# Patient Record
Sex: Female | Born: 1945 | Race: Black or African American | Hispanic: No | Marital: Married | Smoking: Current every day smoker
Health system: Southern US, Community
[De-identification: ages and names within clinical notes are randomized; demographics above are authoritative.]

## PROBLEM LIST (undated history)

## (undated) DIAGNOSIS — K219 Gastro-esophageal reflux disease without esophagitis: Secondary | ICD-10-CM

## (undated) DIAGNOSIS — J309 Allergic rhinitis, unspecified: Secondary | ICD-10-CM

## (undated) DIAGNOSIS — F411 Generalized anxiety disorder: Secondary | ICD-10-CM

## (undated) DIAGNOSIS — E785 Hyperlipidemia, unspecified: Secondary | ICD-10-CM

## (undated) DIAGNOSIS — Z8619 Personal history of other infectious and parasitic diseases: Secondary | ICD-10-CM

## (undated) DIAGNOSIS — T7840XA Allergy, unspecified, initial encounter: Secondary | ICD-10-CM

## (undated) DIAGNOSIS — E119 Type 2 diabetes mellitus without complications: Secondary | ICD-10-CM

## (undated) HISTORY — PX: MOUTH SURGERY: SHX715

## (undated) HISTORY — DX: Type 2 diabetes mellitus without complications: E11.9

## (undated) HISTORY — DX: Personal history of other infectious and parasitic diseases: Z86.19

## (undated) HISTORY — DX: Generalized anxiety disorder: F41.1

## (undated) HISTORY — DX: Hyperlipidemia, unspecified: E78.5

## (undated) HISTORY — DX: Gastro-esophageal reflux disease without esophagitis: K21.9

## (undated) HISTORY — DX: Allergic rhinitis, unspecified: J30.9

## (undated) HISTORY — PX: BREAST BIOPSY: SHX20

## (undated) HISTORY — DX: Allergy, unspecified, initial encounter: T78.40XA

---

## 1999-06-18 ENCOUNTER — Other Ambulatory Visit: Admission: RE | Admit: 1999-06-18 | Discharge: 1999-06-18 | Payer: Self-pay | Admitting: Obstetrics & Gynecology

## 2000-11-29 ENCOUNTER — Encounter: Payer: Self-pay | Admitting: Obstetrics and Gynecology

## 2000-11-29 ENCOUNTER — Ambulatory Visit (HOSPITAL_COMMUNITY): Admission: RE | Admit: 2000-11-29 | Discharge: 2000-11-29 | Payer: Self-pay | Admitting: Obstetrics and Gynecology

## 2002-05-09 ENCOUNTER — Encounter: Payer: Self-pay | Admitting: Internal Medicine

## 2002-05-09 ENCOUNTER — Ambulatory Visit (HOSPITAL_COMMUNITY): Admission: RE | Admit: 2002-05-09 | Discharge: 2002-05-09 | Payer: Self-pay | Admitting: Internal Medicine

## 2002-05-17 ENCOUNTER — Encounter: Payer: Self-pay | Admitting: Internal Medicine

## 2002-05-17 ENCOUNTER — Encounter: Admission: RE | Admit: 2002-05-17 | Discharge: 2002-05-17 | Payer: Self-pay | Admitting: Internal Medicine

## 2003-09-11 ENCOUNTER — Ambulatory Visit (HOSPITAL_COMMUNITY): Admission: RE | Admit: 2003-09-11 | Discharge: 2003-09-11 | Payer: Self-pay | Admitting: Internal Medicine

## 2004-09-15 ENCOUNTER — Ambulatory Visit (HOSPITAL_COMMUNITY): Admission: RE | Admit: 2004-09-15 | Discharge: 2004-09-15 | Payer: Self-pay | Admitting: Internal Medicine

## 2005-07-16 ENCOUNTER — Ambulatory Visit: Payer: Self-pay | Admitting: Internal Medicine

## 2005-07-20 ENCOUNTER — Ambulatory Visit: Payer: Self-pay | Admitting: Internal Medicine

## 2005-11-23 ENCOUNTER — Ambulatory Visit (HOSPITAL_COMMUNITY): Admission: RE | Admit: 2005-11-23 | Discharge: 2005-11-23 | Payer: Self-pay | Admitting: Internal Medicine

## 2005-12-21 ENCOUNTER — Ambulatory Visit: Payer: Self-pay | Admitting: Internal Medicine

## 2006-07-04 ENCOUNTER — Ambulatory Visit: Payer: Self-pay | Admitting: Internal Medicine

## 2006-09-09 ENCOUNTER — Ambulatory Visit: Payer: Self-pay | Admitting: Internal Medicine

## 2006-09-09 LAB — CONVERTED CEMR LAB
ALT: 17 units/L (ref 0–40)
AST: 19 units/L (ref 0–37)
Basophils Relative: 0.2 % (ref 0.0–1.0)
Bilirubin, Direct: 0.1 mg/dL (ref 0.0–0.3)
CO2: 31 meq/L (ref 19–32)
Calcium: 9.6 mg/dL (ref 8.4–10.5)
Eosinophils Absolute: 0.1 10*3/uL (ref 0.0–0.6)
Eosinophils Relative: 1.1 % (ref 0.0–5.0)
GFR calc Af Amer: 65 mL/min
GFR calc non Af Amer: 53 mL/min
Glucose, Bld: 131 mg/dL — ABNORMAL HIGH (ref 70–99)
HCT: 40.1 % (ref 36.0–46.0)
Hemoglobin: 14 g/dL (ref 12.0–15.0)
Hgb A1c MFr Bld: 7 % — ABNORMAL HIGH (ref 4.6–6.0)
Leukocytes, UA: NEGATIVE
Lymphocytes Relative: 37.9 % (ref 12.0–46.0)
MCV: 83 fL (ref 78.0–100.0)
Neutro Abs: 4.9 10*3/uL (ref 1.4–7.7)
Neutrophils Relative %: 54 % (ref 43.0–77.0)
Nitrite: NEGATIVE
Sodium: 142 meq/L (ref 135–145)
Specific Gravity, Urine: 1.025 (ref 1.000–1.03)
TSH: 1.44 microintl units/mL (ref 0.35–5.50)
Total Protein: 7 g/dL (ref 6.0–8.3)
Triglycerides: 258 mg/dL (ref 0–149)
Urobilinogen, UA: 0.2 (ref 0.0–1.0)
WBC: 9 10*3/uL (ref 4.5–10.5)

## 2006-09-15 ENCOUNTER — Ambulatory Visit: Payer: Self-pay | Admitting: Internal Medicine

## 2006-11-17 ENCOUNTER — Ambulatory Visit: Payer: Self-pay | Admitting: Internal Medicine

## 2006-11-24 ENCOUNTER — Encounter: Payer: Self-pay | Admitting: Internal Medicine

## 2006-11-24 DIAGNOSIS — E119 Type 2 diabetes mellitus without complications: Secondary | ICD-10-CM

## 2006-11-24 DIAGNOSIS — E785 Hyperlipidemia, unspecified: Secondary | ICD-10-CM | POA: Insufficient documentation

## 2006-11-24 DIAGNOSIS — J309 Allergic rhinitis, unspecified: Secondary | ICD-10-CM | POA: Insufficient documentation

## 2006-11-24 HISTORY — DX: Hyperlipidemia, unspecified: E78.5

## 2006-11-24 HISTORY — DX: Allergic rhinitis, unspecified: J30.9

## 2006-11-24 HISTORY — DX: Type 2 diabetes mellitus without complications: E11.9

## 2006-11-25 ENCOUNTER — Ambulatory Visit (HOSPITAL_COMMUNITY): Admission: RE | Admit: 2006-11-25 | Discharge: 2006-11-25 | Payer: Self-pay | Admitting: Internal Medicine

## 2007-03-07 ENCOUNTER — Ambulatory Visit: Payer: Self-pay | Admitting: Internal Medicine

## 2007-03-07 DIAGNOSIS — M25569 Pain in unspecified knee: Secondary | ICD-10-CM | POA: Insufficient documentation

## 2007-03-07 DIAGNOSIS — J019 Acute sinusitis, unspecified: Secondary | ICD-10-CM | POA: Insufficient documentation

## 2007-03-08 LAB — CONVERTED CEMR LAB
Calcium: 9.8 mg/dL (ref 8.4–10.5)
Chloride: 99 meq/L (ref 96–112)
Cholesterol: 199 mg/dL (ref 0–200)
Creatinine, Ser: 1 mg/dL (ref 0.4–1.2)
Direct LDL: 99.9 mg/dL
GFR calc non Af Amer: 60 mL/min
Hgb A1c MFr Bld: 6.8 % — ABNORMAL HIGH (ref 4.6–6.0)
Total CHOL/HDL Ratio: 4.7
VLDL: 55 mg/dL — ABNORMAL HIGH (ref 0–40)

## 2007-05-24 ENCOUNTER — Ambulatory Visit: Payer: Self-pay | Admitting: Internal Medicine

## 2007-05-24 DIAGNOSIS — R030 Elevated blood-pressure reading, without diagnosis of hypertension: Secondary | ICD-10-CM

## 2007-11-27 ENCOUNTER — Ambulatory Visit (HOSPITAL_COMMUNITY): Admission: RE | Admit: 2007-11-27 | Discharge: 2007-11-27 | Payer: Self-pay | Admitting: Internal Medicine

## 2007-12-12 ENCOUNTER — Ambulatory Visit: Payer: Self-pay | Admitting: Internal Medicine

## 2007-12-12 DIAGNOSIS — J209 Acute bronchitis, unspecified: Secondary | ICD-10-CM | POA: Insufficient documentation

## 2008-01-17 ENCOUNTER — Ambulatory Visit: Payer: Self-pay | Admitting: Internal Medicine

## 2008-03-04 ENCOUNTER — Ambulatory Visit: Payer: Self-pay | Admitting: Internal Medicine

## 2008-07-05 ENCOUNTER — Ambulatory Visit: Payer: Self-pay | Admitting: Internal Medicine

## 2008-07-05 LAB — CONVERTED CEMR LAB
Basophils Absolute: 0 10*3/uL (ref 0.0–0.1)
Basophils Relative: 0.2 % (ref 0.0–3.0)
Bilirubin, Direct: 0.1 mg/dL (ref 0.0–0.3)
Chloride: 106 meq/L (ref 96–112)
Cholesterol: 121 mg/dL (ref 0–200)
Eosinophils Absolute: 0.1 10*3/uL (ref 0.0–0.7)
GFR calc non Af Amer: 81.03 mL/min (ref >60)
HDL: 39.5 mg/dL (ref >39.00)
Hemoglobin, Urine: NEGATIVE
Hgb A1c MFr Bld: 7.1 % — ABNORMAL HIGH (ref 4.6–6.5)
LDL Cholesterol: 43 mg/dL (ref 0–99)
MCHC: 33.7 g/dL (ref 30.0–36.0)
MCV: 85.7 fL (ref 78.0–100.0)
Microalb Creat Ratio: 70.4 mg/g — ABNORMAL HIGH (ref 0.0–30.0)
Monocytes Absolute: 0.5 10*3/uL (ref 0.1–1.0)
Neutrophils Relative %: 49.8 % (ref 43.0–77.0)
Nitrite: NEGATIVE
Platelets: 237 10*3/uL (ref 150.0–400.0)
Potassium: 4.7 meq/L (ref 3.5–5.1)
RBC: 4.77 M/uL (ref 3.87–5.11)
RDW: 12.9 % (ref 11.5–14.6)
Sodium: 142 meq/L (ref 135–145)
Specific Gravity, Urine: 1.025 (ref 1.000–1.030)
Total Protein, Urine: NEGATIVE mg/dL
Total Protein: 6.8 g/dL (ref 6.0–8.3)
Triglycerides: 193 mg/dL — ABNORMAL HIGH (ref 0.0–149.0)
Urobilinogen, UA: 0.2 (ref 0.0–1.0)
VLDL: 38.6 mg/dL (ref 0.0–40.0)

## 2008-07-10 ENCOUNTER — Ambulatory Visit: Payer: Self-pay | Admitting: Internal Medicine

## 2008-07-10 DIAGNOSIS — F411 Generalized anxiety disorder: Secondary | ICD-10-CM | POA: Insufficient documentation

## 2008-07-10 HISTORY — DX: Generalized anxiety disorder: F41.1

## 2008-09-23 ENCOUNTER — Ambulatory Visit: Payer: Self-pay | Admitting: Internal Medicine

## 2008-11-28 ENCOUNTER — Ambulatory Visit (HOSPITAL_COMMUNITY): Admission: RE | Admit: 2008-11-28 | Discharge: 2008-11-28 | Payer: Self-pay | Admitting: Internal Medicine

## 2009-12-03 ENCOUNTER — Ambulatory Visit (HOSPITAL_COMMUNITY): Admission: RE | Admit: 2009-12-03 | Discharge: 2009-12-03 | Payer: Self-pay | Admitting: Internal Medicine

## 2010-03-11 ENCOUNTER — Ambulatory Visit: Payer: Self-pay | Admitting: Internal Medicine

## 2010-03-13 LAB — CONVERTED CEMR LAB
ALT: 14 units/L (ref 0–35)
BUN: 14 mg/dL (ref 6–23)
Basophils Absolute: 0 10*3/uL (ref 0.0–0.1)
Blood, UA: NEGATIVE
Chloride: 104 meq/L (ref 96–112)
Cholesterol: 132 mg/dL (ref 0–200)
Eosinophils Absolute: 0.1 10*3/uL (ref 0.0–0.7)
Glucose, Bld: 127 mg/dL — ABNORMAL HIGH (ref 70–99)
HCT: 40.8 % (ref 36.0–46.0)
HDL: 43.6 mg/dL (ref >39.00)
Lymphs Abs: 3.2 10*3/uL (ref 0.7–4.0)
MCHC: 33.5 g/dL (ref 30.0–36.0)
MCV: 85 fL (ref 78.0–100.0)
Monocytes Absolute: 0.5 10*3/uL (ref 0.1–1.0)
Platelets: 250 10*3/uL (ref 150.0–400.0)
Potassium: 4.5 meq/L (ref 3.5–5.1)
RDW: 13.8 % (ref 11.5–14.6)
TSH: 1.09 microintl units/mL (ref 0.35–5.50)
Total Bilirubin: 0.4 mg/dL (ref 0.3–1.2)
Total Protein, Urine: NEGATIVE mg/dL
Urine Glucose: NEGATIVE mg/dL
VLDL: 30.8 mg/dL (ref 0.0–40.0)

## 2010-03-18 ENCOUNTER — Encounter: Payer: Self-pay | Admitting: Internal Medicine

## 2010-03-18 ENCOUNTER — Ambulatory Visit: Payer: Self-pay | Admitting: Internal Medicine

## 2010-03-18 DIAGNOSIS — N959 Unspecified menopausal and perimenopausal disorder: Secondary | ICD-10-CM | POA: Insufficient documentation

## 2010-04-01 ENCOUNTER — Telehealth (INDEPENDENT_AMBULATORY_CARE_PROVIDER_SITE_OTHER): Payer: Self-pay | Admitting: *Deleted

## 2010-04-30 NOTE — Progress Notes (Signed)
  Phone Note Other Incoming   Request: Send information Summary of Call: Request for records received from [employer]. Request forwarded to [facility].

## 2010-04-30 NOTE — Assessment & Plan Note (Signed)
Summary: cpx-lb   Vital Signs:  Patient profile:   65 year old female Height:      64 inches Weight:      207.13 pounds BMI:     35.68 O2 Sat:      97 % on Room air Temp:     99 degrees F oral Pulse rate:   99 / minute BP sitting:   130 / 80  (left arm) Cuff size:   large  Vitals Entered By: Zella Ball Ewing CMA Duncan Dull) (March 18, 2010 1:21 PM)  O2 Flow:  Room air  Preventive Care Screening  Last Flu Shot:    Date:  01/27/2010    Results:  given   CC: Adult Physical/RE   CC:  Adult Physical/RE.  History of Present Illness: overall doing well;  Pt denies CP, worsening sob, doe, wheezing, orthopnea, pnd, worsening LE edema, palps, dizziness or syncope   Pt denies new neuro symptoms such as headache, facial or extremity weakness  Pt denies polydipsia, polyuria, or low sugar symptoms such as shakiness improved with eating.  Overall good compliance with meds, trying to follow low chol, DM diet, wt stable, little excercise however  No fever, wt loss, night sweats, loss of appetite or other constitutional symptoms  Denies worsening depressive symptoms, suicidal ideation, or panic.   Overall good compliance with meds, and good tolerability except out of metformin for 3 wks prior to the a1c just done.  Pt states good ability with ADL's, low fall risk, home safety reviewed and adequate, no significant change in hearing or vision, trying to follow lower chol diet, and occasionally active only with regular excercise.   Preventive Screening-Counseling & Management      Drug Use:  no.    Problems Prior to Update: 1)  Menopausal Disorder  (ICD-627.9) 2)  Anxiety  (ICD-300.00) 3)  Preventive Health Care  (ICD-V70.0) 4)  Sinusitis- Acute-nos  (ICD-461.9) 5)  Asthmatic Bronchitis, Acute  (ICD-466.0) 6)  Elevated Blood Pressure Without Diagnosis of Hypertension  (ICD-796.2) 7)  Knee Pain, Left  (ICD-719.46) 8)  Sinusitis- Acute-nos  (ICD-461.9) 9)  Allergic Rhinitis  (ICD-477.9) 10)   Hyperlipidemia  (ICD-272.4) 11)  Diabetes Mellitus, Type II  (ICD-250.00)  Medications Prior to Update: 1)  Simvastatin 40 Mg Tabs (Simvastatin) .... Take 1 Tablet By Mouth Once A Day 2)  Metformin Hcl 500 Mg  Tb24 (Metformin Hcl) .... 2 By Mouth Once Daily 3)  Adult Aspirin Ec Low Strength 81 Mg Tbec (Aspirin) .Marland Kitchen.. 1 By Mouth Once Daily 4)  Meloxicam 15 Mg Tabs (Meloxicam) .Marland Kitchen.. 1 By Mouth Once Daily 5)  Avelox 400 Mg Tabs (Moxifloxacin Hcl) .Marland Kitchen.. 1po Once Daily For 6 Days in Samples 6)  Proair Hfa 108 (90 Base) Mcg/act Aers (Albuterol Sulfate) .... 2 Puffs Four Times Per Day As Needed For Shortness of Breath 7)  Prednisone 10 Mg Tabs (Prednisone) .... 3po Qd For 3days, Then 2po Qd For 3days, Then 1po Qd For 3days, Then Stop 8)  Promethazine-Codeine 6.25-10 Mg/42ml Syrp (Promethazine-Codeine) .Marland Kitchen.. 1 Tsp By Mouth Q 6 Hrs As Needed Cough  Current Medications (verified): 1)  Simvastatin 40 Mg Tabs (Simvastatin) .... Take 1 Tablet By Mouth Once A Day 2)  Metformin Hcl 500 Mg  Tb24 (Metformin Hcl) .... 2 By Mouth Once Daily 3)  Adult Aspirin Ec Low Strength 81 Mg Tbec (Aspirin) .Marland Kitchen.. 1 By Mouth Once Daily 4)  Meloxicam 15 Mg Tabs (Meloxicam) .Marland Kitchen.. 1 By Mouth Once Daily As Needed  Allergies (  verified): 1)  ! 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 HTN stroke  Social History: Reviewed history from 03/07/2007 and no changes required. 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         all otherwise negative per pt -    Physical Exam  General:  alert and overweight-appearing. Head:  normocephalic and atraumatic.   Eyes:  vision grossly intact, pupils equal, and pupils round.   Ears:  R ear normal and L ear normal.   Nose:  no external deformity and no nasal discharge.   Mouth:  no gingival abnormalities and pharynx pink and moist.   Neck:  supple and no masses.   Lungs:  normal respiratory effort and normal breath sounds.   Heart:  normal rate and regular rhythm.   Abdomen:  soft, non-tender, and normal bowel sounds.   Msk:  no joint tenderness and no joint swelling.   Extremities:  no edema, no ulcers  Neurologic:  strength normal in all extremities and gait normal.   Skin:  color normal and no rashes.   Psych:  not depressed appearing and slightly anxious.     Impression & Recommendations:  Problem # 1:  Preventive Health Care (ICD-V70.0) Overall doing well, age appropriate education and counseling updated, referral for preventive services and immunizations addressed, dietary counseling and smoking status adressed , most recent labs reviewed, ecg reviewed I have personally reviewed and have noted 1.The patient's medical and social history 2.Their use of alcohol, tobacco or illicit drugs 3.Their current medications and supplements 4. Functional ability including ADL's, fall risk, home safety risk, hearing & visual impairment  5.Diet and  physical activities 6.Evidence for depression or mood disorders The patients weight, height, BMI  have been recorded in the chart I have made referrals, counseling and provided education to the patient based review of the above  Orders: EKG w/ Interpretation (93000)  Problem # 2:  DIABETES MELLITUS, TYPE II (ICD-250.00)  Her updated medication list for this problem includes:    Metformin Hcl 500 Mg Tb24 (Metformin hcl) .Marland Kitchen... 2 by mouth once daily    Adult Aspirin Ec Low Strength 81 Mg Tbec (Aspirin) .Marland Kitchen... 1 by mouth once daily  Labs Reviewed: Creat: 1.0 (03/13/2010)    Reviewed HgBA1c results: 7.1 (03/13/2010)  7.1 (07/05/2008) to re-start meds, Continue all previous medications as before this visit , Pt to cont DM diet, excercise, wt control efforts; to check labs next visit  Complete Medication List: 1)  Simvastatin 40 Mg Tabs (Simvastatin) .... Take 1 tablet by mouth once a day 2)  Metformin Hcl 500 Mg Tb24 (Metformin hcl) .... 2 by mouth once daily 3)  Adult Aspirin Ec Low Strength 81  Mg Tbec (Aspirin) .Marland Kitchen.. 1 by mouth once daily 4)  Meloxicam 15 Mg Tabs (Meloxicam) .Marland Kitchen.. 1 by mouth once daily as needed  Other Orders: T-Bone Densitometry [id number])  Patient Instructions: 1)  please schedule the bone density test before leaving today 2)  Continue all previous medications as before this visit  3)  Please schedule a follow-up appointment in 1 year, or sooner if needed Prescriptions: MELOXICAM 15 MG TABS (MELOXICAM) 1 by mouth once daily as needed  #90 x 3   Entered and Authorized by:   Corwin Levins MD   Signed by:   Corwin Levins MD on 03/18/2010   Method used:   Electronically to        [facility]  [address] [phone]* (retail)       [address]       [place], [place]  [id number]       Ph: [phone] or [phone]       Fax: [phone]   RxID:   [id number] METFORMIN HCL 500 MG  TB24 (METFORMIN HCL) 2 by mouth once daily  #180 x 3   Entered  and Authorized by:   Corwin Levins MD   Signed by:   Corwin Levins MD on 03/18/2010   Method used:   [facility] to        [facility]  [address] [phone][facility] (retail)       [address]       [place], [place]  [id number]       Ph: [phone] or [phone]       Fax: [phone]   RxID:   [id number] SIMVASTATIN 40 MG TABS (SIMVASTATIN) Take 1 tablet by mouth once a day  #90 x 3   Entered and Authorized by:   Corwin Levins MD   Signed by:   Corwin Levins MD on 03/18/2010   Method used:   [facility]        [facility]  [address] [phone][facility] (retail)       [address]       [place], [place]  [address]       Ph: [phone] or [phone]       Fax: [phone]   RxID:   [id number]    Orders Added: 1)  EKG w/ Interpretation [93000] 2)  T-Bone Densitometry [77080] 3)  Est. Patient 65& > [44315]

## 2010-05-05 ENCOUNTER — Telehealth: Payer: Self-pay | Admitting: Internal Medicine

## 2010-05-05 ENCOUNTER — Ambulatory Visit (INDEPENDENT_AMBULATORY_CARE_PROVIDER_SITE_OTHER)
Admission: RE | Admit: 2010-05-05 | Discharge: 2010-05-05 | Disposition: A | Discharge status: Home / Self Care | Payer: Federal, State, Local not specified - PPO | Source: Ambulatory Visit | Attending: Internal Medicine | Admitting: Internal Medicine

## 2010-05-05 ENCOUNTER — Encounter: Payer: Self-pay | Admitting: Internal Medicine

## 2010-05-05 ENCOUNTER — Other Ambulatory Visit: Payer: Self-pay | Admitting: Internal Medicine

## 2010-05-05 ENCOUNTER — Inpatient Hospital Stay: Admission: RE | Admit: 2010-05-05 | Payer: Self-pay | Source: Ambulatory Visit

## 2010-05-05 DIAGNOSIS — N959 Unspecified menopausal and perimenopausal disorder: Secondary | ICD-10-CM

## 2010-05-14 NOTE — Progress Notes (Signed)
Summary: DXA order  Phone Note From Other Clinic   Caller: Tera Helper [phone] Summary of Call: Pt is scheduled for DXA at 9a today. Xray states they cannot proceed without an order for Lumbar Vertebral Assessment. Pt is waiting in radiology. Initial call taken by: Margaret Pyle, CMA,  May 05, 2010 8:52 AM  Follow-up for Phone Call        done   please ask - is this needed for every DXA? Follow-up by: Corwin Levins MD,  May 05, 2010 9:03 AM  Additional Follow-up for Phone Call Additional follow up Details #1::        it is needed with every DXA order [facility]) Additional Follow-up by: Margaret Pyle, CMA,  May 05, 2010 9:08 AM    Additional Follow-up for Phone Call Additional follow up Details #2::    thanks good to know Follow-up by: Corwin Levins MD,  May 05, 2010 12:27 PM

## 2011-03-04 ENCOUNTER — Other Ambulatory Visit: Payer: Self-pay | Admitting: Internal Medicine

## 2011-04-15 ENCOUNTER — Other Ambulatory Visit: Payer: Self-pay

## 2011-04-15 MED ORDER — SIMVASTATIN 40 MG PO TABS: 40.0000 mg | ORAL_TABLET | Freq: Every day | ORAL | Status: DC

## 2011-04-28 ENCOUNTER — Telehealth: Payer: Self-pay

## 2011-04-28 DIAGNOSIS — Z0000 Encounter for general adult medical examination without abnormal findings: Secondary | ICD-10-CM

## 2011-04-28 NOTE — Telephone Encounter (Signed)
Put order in for labs. 

## 2011-04-29 ENCOUNTER — Ambulatory Visit (INDEPENDENT_AMBULATORY_CARE_PROVIDER_SITE_OTHER): Payer: BC Managed Care – PPO | Admitting: Internal Medicine

## 2011-04-29 VITALS — BP 140/82 | HR 91 | Temp 98.0°F | Ht 64.0 in | Wt 195.5 lb

## 2011-04-29 DIAGNOSIS — Z0000 Encounter for general adult medical examination without abnormal findings: Secondary | ICD-10-CM

## 2011-04-29 DIAGNOSIS — R062 Wheezing: Secondary | ICD-10-CM

## 2011-04-29 DIAGNOSIS — E119 Type 2 diabetes mellitus without complications: Secondary | ICD-10-CM

## 2011-04-29 DIAGNOSIS — J209 Acute bronchitis, unspecified: Secondary | ICD-10-CM

## 2011-04-29 DIAGNOSIS — Z0001 Encounter for general adult medical examination with abnormal findings: Secondary | ICD-10-CM | POA: Insufficient documentation

## 2011-04-29 MED ORDER — METHYLPREDNISOLONE ACETATE PF 80 MG/ML IJ SUSP
120.0000 mg | Freq: Once | INTRAMUSCULAR | Status: AC
  Administered 2011-04-29: 120 mg via INTRAMUSCULAR

## 2011-04-29 MED ORDER — HYDROCODONE-HOMATROPINE 5-1.5 MG/5ML PO SYRP: 5.0000 mL | ORAL_SOLUTION | Freq: Four times a day (QID) | ORAL | Status: AC | PRN

## 2011-04-29 MED ORDER — METFORMIN HCL ER 500 MG PO TB24: 1000.0000 mg | ORAL_TABLET | Freq: Every day | ORAL | Status: DC

## 2011-04-29 MED ORDER — SIMVASTATIN 40 MG PO TABS: 40.0000 mg | ORAL_TABLET | Freq: Every day | ORAL | Status: DC

## 2011-04-29 MED ORDER — PREDNISONE 20 MG PO TABS: 20.0000 mg | ORAL_TABLET | Freq: Every day | ORAL | Status: AC

## 2011-04-29 MED ORDER — AZITHROMYCIN 250 MG PO TABS: ORAL_TABLET | ORAL | Status: AC

## 2011-04-29 NOTE — Patient Instructions (Addendum)
You had the steroid shot today Take all new medications as prescribed Continue all other medications as before All of your medications were refilled today Please return in 2 months with Lab testing done 3-5 days before, or sooner if needed

## 2011-05-01 ENCOUNTER — Encounter: Payer: Self-pay | Admitting: Internal Medicine

## 2011-05-01 NOTE — Progress Notes (Signed)
  Subjective:    Patient ID: Brittany Oconnor, female    DOB: 09-14-1945, 66 y.o.   MRN: [id number]  HPI  Here with acute onset mild to mod 2-3 days ST, HA, general weakness and malaise, with prod cough greenish sputum, but Pt denies chest pain, increased sob or doe, wheezing, orthopnea, PND, increased LE swelling, palpitations, dizziness or syncope, except for onset mild wheezing today with mild sob. Pt denies chest pain, increased sob or doe, wheezing, orthopnea, PND, increased LE swelling, palpitations, dizziness or syncope.  Pt denies new neurological symptoms such as new headache, or facial or extremity weakness or numbness   Pt denies polydipsia, polyuria, or low sugar symptoms such as weakness or confusion improved with po intake.  Pt states overall good compliance with meds, trying to follow lower cholesterol, diabetic diet, wt overall stable but little exercise however. Past Medical History  Diagnosis Date  . ALLERGIC RHINITIS 11/24/2006  . ANXIETY 07/10/2008  . DIABETES MELLITUS, TYPE II 11/24/2006  . HYPERLIPIDEMIA 11/24/2006   History reviewed. No pertinent past surgical history.  reports that she has never smoked. She does not have any smokeless tobacco history on file. She reports that she does not drink alcohol or use illicit drugs. family history includes Hypertension in her father. Allergies  Allergen Reactions  . Doxycycline    No current outpatient prescriptions on file prior to visit.   Review of Systems Review of Systems  Constitutional: Negative for diaphoresis and unexpected weight change.  HENT: Negative for drooling and tinnitus.   Eyes: Negative for photophobia and visual disturbance.  Respiratory: Negative for choking and stridor.   Gastrointestinal: Negative for vomiting and blood in stool.  Genitourinary: Negative for hematuria and decreased urine volume.    Objective:   Physical Exam BP 140/82  Pulse 91  Temp(Src) 98 F (36.7 C) (Oral)  Ht 5\' 4"  (1.626 m)  Wt  195 lb 8 oz (88.678 kg)  BMI 33.56 kg/m2  SpO2 95% Physical Exam  VS noted, .mild ill Constitutional: Pt appears well-developed and well-nourished.  HENT: Head: Normocephalic.  Right Ear: External ear normal.  Left Ear: External ear normal.  Bilat tm's mild erythema.  Sinus nontender.  Pharynx mild erythema Eyes: Conjunctivae and EOM are normal. Pupils are equal, round, and reactive to light.  Neck: Normal range of motion. Neck supple.  Cardiovascular: Normal rate and regular rhythm.   Pulmonary/Chest: Effort normal and breath sounds mild decr with few wheeze bilat Skin: Skin is warm. No erythema.  Psychiatric: Pt behavior is normal. Thought content normal. 1+ nervous    Assessment & Plan:

## 2011-05-01 NOTE — Assessment & Plan Note (Signed)
stable overall by hx and exam, most recent data reviewed with pt, and pt to continue medical treatment as before  Lab Results  Component Value Date   HGBA1C 7.1* 03/13/2010   To call for cbg > 200 on steroid tx

## 2011-05-01 NOTE — Assessment & Plan Note (Signed)
Mild to mod, for antibx course,  to f/u any worsening symptoms or concerns 

## 2011-05-01 NOTE — Assessment & Plan Note (Signed)
Mild to mod, for predpack  course,  to f/u any worsening symptoms or concerns 

## 2011-05-04 ENCOUNTER — Other Ambulatory Visit: Payer: Self-pay | Admitting: Internal Medicine

## 2011-05-04 DIAGNOSIS — Z1231 Encounter for screening mammogram for malignant neoplasm of breast: Secondary | ICD-10-CM

## 2011-06-01 ENCOUNTER — Ambulatory Visit (HOSPITAL_COMMUNITY)
Admission: RE | Admit: 2011-06-01 | Discharge: 2011-06-01 | Disposition: A | Discharge status: Home / Self Care | Payer: BC Managed Care – PPO | Source: Ambulatory Visit | Attending: Internal Medicine | Admitting: Internal Medicine

## 2011-06-01 DIAGNOSIS — Z1231 Encounter for screening mammogram for malignant neoplasm of breast: Secondary | ICD-10-CM

## 2011-06-04 ENCOUNTER — Other Ambulatory Visit (INDEPENDENT_AMBULATORY_CARE_PROVIDER_SITE_OTHER): Payer: BC Managed Care – PPO

## 2011-06-04 DIAGNOSIS — E119 Type 2 diabetes mellitus without complications: Secondary | ICD-10-CM

## 2011-06-04 DIAGNOSIS — Z0000 Encounter for general adult medical examination without abnormal findings: Secondary | ICD-10-CM

## 2011-06-04 LAB — CBC WITH DIFFERENTIAL/PLATELET
Basophils Absolute: 0 10*3/uL (ref 0.0–0.1)
Eosinophils Absolute: 0.1 10*3/uL (ref 0.0–0.7)
Lymphocytes Relative: 38.8 % (ref 12.0–46.0)
MCHC: 33 g/dL (ref 30.0–36.0)
Neutrophils Relative %: 54.6 % (ref 43.0–77.0)
Platelets: 268 10*3/uL (ref 150.0–400.0)
RBC: 4.99 Mil/uL (ref 3.87–5.11)
RDW: 14.5 % (ref 11.5–14.6)

## 2011-06-04 LAB — URINALYSIS, ROUTINE W REFLEX MICROSCOPIC
Bilirubin Urine: NEGATIVE
Nitrite: NEGATIVE
pH: 6 (ref 5.0–8.0)

## 2011-06-04 LAB — HEMOGLOBIN A1C: Hgb A1c MFr Bld: 7.1 % — ABNORMAL HIGH (ref 4.6–6.5)

## 2011-06-04 LAB — BASIC METABOLIC PANEL
BUN: 13 mg/dL (ref 6–23)
CO2: 30 mEq/L (ref 19–32)
Chloride: 104 mEq/L (ref 96–112)
Creatinine, Ser: 0.8 mg/dL (ref 0.4–1.2)
Potassium: 4.2 mEq/L (ref 3.5–5.1)

## 2011-06-04 LAB — HEPATIC FUNCTION PANEL
Alkaline Phosphatase: 59 U/L (ref 39–117)
Bilirubin, Direct: 0.1 mg/dL (ref 0.0–0.3)
Total Bilirubin: 0.3 mg/dL (ref 0.3–1.2)

## 2011-06-04 LAB — LIPID PANEL
HDL: 70.4 mg/dL (ref >39.00)
LDL Cholesterol: 34 mg/dL (ref 0–99)
Total CHOL/HDL Ratio: 2
VLDL: 21.2 mg/dL (ref 0.0–40.0)

## 2011-06-09 ENCOUNTER — Telehealth: Payer: Self-pay

## 2011-06-09 ENCOUNTER — Ambulatory Visit (INDEPENDENT_AMBULATORY_CARE_PROVIDER_SITE_OTHER): Payer: BC Managed Care – PPO | Admitting: Internal Medicine

## 2011-06-09 ENCOUNTER — Encounter: Payer: Self-pay | Admitting: Internal Medicine

## 2011-06-09 VITALS — BP 140/80 | HR 82 | Temp 98.2°F | Ht 64.0 in | Wt 195.1 lb

## 2011-06-09 DIAGNOSIS — E119 Type 2 diabetes mellitus without complications: Secondary | ICD-10-CM

## 2011-06-09 DIAGNOSIS — Z800 Family history of malignant neoplasm of digestive organs: Secondary | ICD-10-CM

## 2011-06-09 DIAGNOSIS — Z0000 Encounter for general adult medical examination without abnormal findings: Secondary | ICD-10-CM

## 2011-06-09 DIAGNOSIS — E785 Hyperlipidemia, unspecified: Secondary | ICD-10-CM

## 2011-06-09 DIAGNOSIS — L989 Disorder of the skin and subcutaneous tissue, unspecified: Secondary | ICD-10-CM

## 2011-06-09 DIAGNOSIS — J309 Allergic rhinitis, unspecified: Secondary | ICD-10-CM

## 2011-06-09 MED ORDER — MONTELUKAST SODIUM 10 MG PO TABS: 10.0000 mg | ORAL_TABLET | Freq: Every day | ORAL | Status: DC

## 2011-06-09 MED ORDER — METFORMIN HCL ER 500 MG PO TB24: ORAL_TABLET | ORAL | Status: DC

## 2011-06-09 MED ORDER — FEXOFENADINE HCL 180 MG PO TABS: 180.0000 mg | ORAL_TABLET | Freq: Every day | ORAL | Status: AC

## 2011-06-09 MED ORDER — ASPIRIN 81 MG PO TBEC: 81.0000 mg | DELAYED_RELEASE_TABLET | Freq: Every day | ORAL | Status: AC

## 2011-06-09 MED ORDER — SIMVASTATIN 20 MG PO TABS: 20.0000 mg | ORAL_TABLET | Freq: Every evening | ORAL | Status: DC

## 2011-06-09 MED ORDER — METHYLPREDNISOLONE ACETATE PF 80 MG/ML IJ SUSP
120.0000 mg | Freq: Once | INTRAMUSCULAR | Status: AC
  Administered 2011-06-09: 120 mg via INTRAMUSCULAR

## 2011-06-09 NOTE — Assessment & Plan Note (Signed)
I think possibly seborrheic keratosis, but cant r/o malignancy, for derm referral per pt request

## 2011-06-09 NOTE — Assessment & Plan Note (Signed)
Pt reqeuests f/u colonscopy, last done aug 2008

## 2011-06-09 NOTE — Telephone Encounter (Signed)
Ok to try change to allegra 180 mg prn, done escript

## 2011-06-09 NOTE — Patient Instructions (Signed)
You had the steroid shot today Take all new medications as prescribed - the singulair 10 mg per day OK to decrease the simvastatin to 20 mg per day Please increase the metformin to 3 pills per day You will be contacted regarding the referral for: dermatology, and colonoscopy You are otherwise up to date with prevention Please have the pharmacy call if you need further refills Please return in 6 mo with Lab testing done 3-5 days before

## 2011-06-09 NOTE — Assessment & Plan Note (Signed)
Mild persistent uncontrolled, to increase the metformin to 3 tabs per day, cont diet, exercise, wt loss efforts,   Lab Results  Component Value Date   HGBA1C 7.1* 06/04/2011

## 2011-06-09 NOTE — Telephone Encounter (Signed)
Called the patient left message of medication change. 

## 2011-06-09 NOTE — Telephone Encounter (Signed)
Received PA for Montelukast SOD 10 mg tab. Proceed or offer alternative

## 2011-06-09 NOTE — Assessment & Plan Note (Signed)

## 2011-06-09 NOTE — Assessment & Plan Note (Signed)
Mild to mod ongoing with flare recent - for depomedrol IM, adn try singulair if ok with insurance,  to f/u any worsening symptoms or concerns

## 2011-06-09 NOTE — Progress Notes (Signed)
Subjective:    Patient ID: Brittany Oconnor, female    DOB: 11-24-1945, 66 y.o.   MRN: [id number]  HPI  Here for wellness and f/u;  Overall doing ok;  Pt denies CP, worsening SOB, DOE, wheezing, orthopnea, PND, worsening LE edema, palpitations, dizziness or syncope.  Pt denies neurological change such as new Headache, facial or extremity weakness.  Pt denies polydipsia, polyuria, or low sugar symptoms. Pt states overall good compliance with treatment and medications, good tolerability, and trying to follow lower cholesterol diet.  Pt denies worsening depressive symptoms, suicidal ideation or panic. No fever, wt loss, night sweats, loss of appetite, or other constitutional symptoms.  Pt states good ability with ADL's, low fall risk, home safety reviewed and adequate, no significant changes in hearing or vision, and occasionally active with exercise. Does have several wks ongoing nasal allergy symptoms with clear congestion, itch and sneeze, without fever, pain, ST, cough or wheezing.  Has some intermittent left LBP without radicular pain or gait change.  Has a skin lesion to the right lower lateral side that she thinks is enlarging last few months, and changing color Past Medical History  Diagnosis Date  . ALLERGIC RHINITIS 11/24/2006  . ANXIETY 07/10/2008  . DIABETES MELLITUS, TYPE II 11/24/2006  . HYPERLIPIDEMIA 11/24/2006   No past surgical history on file.  reports that she has never smoked. She does not have any smokeless tobacco history on file. She reports that she does not drink alcohol or use illicit drugs. family history includes Hypertension in her father. Allergies  Allergen Reactions  . Doxycycline    No current outpatient prescriptions on file prior to visit.   No current facility-administered medications on file prior to visit.   Review of Systems Review of Systems  Constitutional: Negative for diaphoresis, activity change, appetite change and unexpected weight change.  HENT:  Negative for hearing loss, ear pain, facial swelling, mouth sores and neck stiffness.   Eyes: Negative for pain, redness and visual disturbance.  Respiratory: Negative for shortness of breath and wheezing.   Cardiovascular: Negative for chest pain and palpitations.  Gastrointestinal: Negative for diarrhea, blood in stool, abdominal distention and rectal pain.  Genitourinary: Negative for hematuria, flank pain and decreased urine volume.  Musculoskeletal: Negative for myalgias and joint swelling.  Skin: Negative for color change and wound.  Neurological: Negative for syncope and numbness.  Hematological: Negative for adenopathy.  Psychiatric/Behavioral: Negative for hallucinations, self-injury, decreased concentration and agitation.      Objective:   Physical Exam BP 140/80  Pulse 82  Temp(Src) 98.2 F (36.8 C) (Oral)  Ht 5\' 4"  (1.626 m)  Wt 195 lb 2 oz (88.508 kg)  BMI 33.49 kg/m2  SpO2 97% Physical Exam  VS noted Constitutional: Pt is oriented to person, place, and time. Appears well-developed and well-nourished.  HENT:  Head: Normocephalic and atraumatic.  Right Ear: External ear normal.  Left Ear: External ear normal.  Nose: Nose normal.  Mouth/Throat: Oropharynx is clear and moist.  Bilat tm's mild erythema.  Sinus nontender.  Pharynx mild erythema Eyes: Conjunctivae and EOM are normal. Pupils are equal, round, and reactive to light.  Neck: Normal range of motion. Neck supple. No JVD present. No tracheal deviation present.  Cardiovascular: Normal rate, regular rhythm, normal heart sounds and intact distal pulses.   Pulmonary/Chest: Effort normal and breath sounds normal.  Abdominal: Soft. Bowel sounds are normal. There is no tenderness.  Musculoskeletal: Normal range of motion. Exhibits no edema.  Lymphadenopathy:  Has  no cervical adenopathy.  Neurological: Pt is alert and oriented to person, place, and time. Pt has normal reflexes. No cranial nerve deficit.  Skin: Skin is  warm and dry. No rash noted.  Approx 1 cm raised dark/tan lesion to right side noted, NT Psychiatric:  Has  normal mood and affect. Behavior is normal.     Assessment & Plan:

## 2011-06-09 NOTE — Assessment & Plan Note (Signed)
Mild overcontrolled with better diet recent per pt-  Lab Results  Component Value Date   LDLCALC 34 06/04/2011   To decr the simvastatin to 20 mg per day, cont diet,  to f/u any worsening symptoms or concerns

## 2011-09-02 ENCOUNTER — Ambulatory Visit (INDEPENDENT_AMBULATORY_CARE_PROVIDER_SITE_OTHER): Payer: BC Managed Care – PPO | Admitting: Internal Medicine

## 2011-09-02 ENCOUNTER — Encounter: Payer: Self-pay | Admitting: Internal Medicine

## 2011-09-02 VITALS — BP 140/82 | HR 94 | Temp 97.5°F | Ht 65.0 in | Wt 198.0 lb

## 2011-09-02 DIAGNOSIS — J309 Allergic rhinitis, unspecified: Secondary | ICD-10-CM

## 2011-09-02 DIAGNOSIS — R062 Wheezing: Secondary | ICD-10-CM

## 2011-09-02 DIAGNOSIS — E785 Hyperlipidemia, unspecified: Secondary | ICD-10-CM

## 2011-09-02 DIAGNOSIS — E119 Type 2 diabetes mellitus without complications: Secondary | ICD-10-CM

## 2011-09-02 MED ORDER — CETIRIZINE HCL 10 MG PO TABS: 10.0000 mg | ORAL_TABLET | Freq: Every day | ORAL | Status: DC

## 2011-09-02 MED ORDER — METHYLPREDNISOLONE ACETATE 80 MG/ML IJ SUSP
120.0000 mg | Freq: Once | INTRAMUSCULAR | Status: AC
  Administered 2011-09-02: 120 mg via INTRAMUSCULAR

## 2011-09-02 MED ORDER — FLUTICASONE PROPIONATE (INHAL) 100 MCG/BLIST IN AEPB: 1.0000 | INHALATION_SPRAY | Freq: Two times a day (BID) | RESPIRATORY_TRACT | Status: DC

## 2011-09-02 MED ORDER — FLUTICASONE PROPIONATE 50 MCG/ACT NA SUSP: 2.0000 | Freq: Every day | NASAL | Status: DC

## 2011-09-02 NOTE — Patient Instructions (Addendum)
You had the steroid shot today Take all new medications as prescribed  - the zyrtec, flonase, and the sample of the Flovent discus at 1 puff twice per day You also have the prescription for the Flovent if this helps the wheezing and not having to wake up at night Continue all other medications as before Your zocor refill was also done to the pharmacy

## 2011-09-04 DIAGNOSIS — R062 Wheezing: Secondary | ICD-10-CM | POA: Insufficient documentation

## 2011-09-04 NOTE — Assessment & Plan Note (Signed)
stable overall by hx and exam, most recent data reviewed with pt, and pt to continue medical treatment as before Lab Results  Component Value Date   LDLCALC 34 06/04/2011  for zocor refill today, cont diet

## 2011-09-04 NOTE — Assessment & Plan Note (Signed)
stable overall by hx and exam, most recent data reviewed with pt, and pt to continue medical treatment as before Lab Results  Component Value Date   HGBA1C 7.1* 06/04/2011

## 2011-09-04 NOTE — Assessment & Plan Note (Signed)
Mild to mod, for depomedrol IM now, and zyrtec/flonase asd,  to f/u any worsening symptoms or concerns

## 2011-09-04 NOTE — Assessment & Plan Note (Signed)
?   Asthma - exam ok today, gave flovent discus for trial, to continue if improves symptoms

## 2011-09-04 NOTE — Progress Notes (Signed)
Subjective:    Patient ID: Brittany Oconnor, female    DOB: 1945-09-25, 66 y.o.   MRN: [id number]  HPI  Here to f/u; Does have several wks ongoing nasal allergy symptoms with clear congestion, itch and sneeze, without fever, pain, ST, cough, but has had increased wheezing as well with mild chest congestion as well.  Pt denies fever, wt loss, night sweats, loss of appetite, or other constitutional symptoms  Pt denies chest pain, increased sob or doe, wheezing, orthopnea, PND, increased LE swelling, palpitations, dizziness or syncope other than the above.  Pt denies new neurological symptoms such as new headache, or facial or extremity weakness or numbness   Pt denies polydipsia, polyuria.  Has been trying to follow lower chol diet, need zocor refill Past Medical History  Diagnosis Date  . ALLERGIC RHINITIS 11/24/2006  . ANXIETY 07/10/2008  . DIABETES MELLITUS, TYPE II 11/24/2006  . HYPERLIPIDEMIA 11/24/2006   No past surgical history on file.  reports that she has never smoked. She does not have any smokeless tobacco history on file. She reports that she does not drink alcohol or use illicit drugs. family history includes Hypertension in her father. Allergies  Allergen Reactions  . Doxycycline    Current Outpatient Prescriptions on File Prior to Visit  Medication Sig Dispense Refill  . aspirin 81 MG EC tablet Take 1 tablet (81 mg total) by mouth daily. Swallow whole.  30 tablet  12  . fexofenadine (ALLEGRA) 180 MG tablet Take 1 tablet (180 mg total) by mouth daily.  30 tablet  11  . metFORMIN (GLUCOPHAGE-XR) 500 MG 24 hr tablet 3 tabs by mouth in the AM  270 tablet  3  . simvastatin (ZOCOR) 20 MG tablet Take 1 tablet (20 mg total) by mouth every evening.  90 tablet  3  . cetirizine (ZYRTEC) 10 MG tablet Take 1 tablet (10 mg total) by mouth daily.  30 tablet  11  . fluticasone (FLONASE) 50 MCG/ACT nasal spray Place 2 sprays into the nose daily.  16 g  2  . Fluticasone Propionate, Inhal, (FLOVENT  DISKUS) 100 MCG/BLIST AEPB Inhale 1 puff into the lungs 2 (two) times daily.  60 each  11   Review of Systems Review of Systems  Constitutional: Negative for diaphoresis and unexpected weight change.    Eyes: Negative for photophobia and visual disturbance.  Respiratory: Negative for choking and stridor.   Gastrointestinal: Negative for vomiting and blood in stool.  Genitourinary: Negative for hematuria and decreased urine volume.  Musculoskeletal: Negative for gait problem.  Skin: Negative for color change and wound.  Psychiatric/Behavioral: Negative for decreased concentration. The patient is not hyperactive.       Objective:   Physical Exam BP 140/82  Pulse 94  Temp(Src) 97.5 F (36.4 C) (Oral)  Ht 5\' 5"  (1.651 m)  Wt 198 lb (89.812 kg)  BMI 32.95 kg/m2  SpO2 96% Physical Exam  VS noted Constitutional: Pt appears well-developed and well-nourished.  HENT: Head: Normocephalic.  Right Ear: External ear normal.  Left Ear: External ear normal.  Eyes: Conjunctivae and EOM are normal. Pupils are equal, round, and reactive to light.  Neck: Normal range of motion. Neck supple.  Cardiovascular: Normal rate and regular rhythm.   Pulmonary/Chest: Effort normal and breath sounds normal.  Abd:  Soft, NT, non-distended, +BS Neurological: Pt is alert. Not confused Skin: Skin is warm. No erythema.  Psychiatric: Pt behavior is normal. Thought content normal. 1+ nervous    Assessment &  Plan:

## 2011-12-13 ENCOUNTER — Ambulatory Visit: Payer: BC Managed Care – PPO | Admitting: Internal Medicine

## 2012-03-06 ENCOUNTER — Encounter: Payer: Self-pay | Admitting: Internal Medicine

## 2012-03-06 ENCOUNTER — Other Ambulatory Visit (INDEPENDENT_AMBULATORY_CARE_PROVIDER_SITE_OTHER): Payer: BC Managed Care – PPO

## 2012-03-06 ENCOUNTER — Ambulatory Visit (INDEPENDENT_AMBULATORY_CARE_PROVIDER_SITE_OTHER): Payer: BC Managed Care – PPO | Admitting: Internal Medicine

## 2012-03-06 VITALS — BP 162/102 | HR 94 | Temp 97.1°F | Ht 64.0 in | Wt 194.0 lb

## 2012-03-06 DIAGNOSIS — J209 Acute bronchitis, unspecified: Secondary | ICD-10-CM | POA: Insufficient documentation

## 2012-03-06 DIAGNOSIS — Z0000 Encounter for general adult medical examination without abnormal findings: Secondary | ICD-10-CM

## 2012-03-06 DIAGNOSIS — R030 Elevated blood-pressure reading, without diagnosis of hypertension: Secondary | ICD-10-CM

## 2012-03-06 DIAGNOSIS — E119 Type 2 diabetes mellitus without complications: Secondary | ICD-10-CM

## 2012-03-06 LAB — LIPID PANEL
Cholesterol: 128 mg/dL (ref 0–200)
HDL: 42.3 mg/dL (ref >39.00)
VLDL: 32.8 mg/dL (ref 0.0–40.0)

## 2012-03-06 LAB — BASIC METABOLIC PANEL
BUN: 10 mg/dL (ref 6–23)
Calcium: 9.5 mg/dL (ref 8.4–10.5)
GFR: 71.78 mL/min (ref >60.00)
Glucose, Bld: 89 mg/dL (ref 70–99)

## 2012-03-06 LAB — HEMOGLOBIN A1C: Hgb A1c MFr Bld: 7 % — ABNORMAL HIGH (ref 4.6–6.5)

## 2012-03-06 MED ORDER — HYDROCOD POLST-CHLORPHEN POLST 10-8 MG/5ML PO LQCR: 5.0000 mL | Freq: Two times a day (BID) | ORAL | Status: DC | PRN

## 2012-03-06 MED ORDER — AZITHROMYCIN 250 MG PO TABS: ORAL_TABLET | ORAL | Status: DC

## 2012-03-06 NOTE — Patient Instructions (Addendum)
Take all new medications as prescribed Continue all other medications as before Please continue your efforts at being more active, low cholesterol diabetic diet, and weight control. Please go to LAB in the [facility] for the blood and/or urine tests to be done today You will be contacted by phone if any changes need to be made immediately.  Otherwise, you will receive a letter about your results with an explanation, but please check with MyChart first. Thank you for enrolling in MyChart. Please follow the instructions below to securely access your online medical record. MyChart allows you to send messages to your doctor, view your test results, renew your prescriptions, schedule appointments, and more. To Log into MyChart, please go to [email or web address], and your Username is: [email or web address] You will be contacted regarding the referral for: colonoscopy Please return in 6 mo with Lab testing done 3-5 days before

## 2012-03-06 NOTE — Progress Notes (Signed)
  Subjective:    Patient ID: Brittany Oconnor, female    DOB: 12/07/1945, 66 y.o.   MRN: [id number]  HPI  Here with acute onset mild to mod 2-3 days ST, HA, general weakness and malaise, with prod cough greenish sputum, but Pt denies chest pain, increased sob or doe, wheezing, orthopnea, PND, increased LE swelling, palpitations, dizziness or syncope.  Pt denies new neurological symptoms such as new headache, or facial or extremity weakness or numbness   Pt denies polydipsia, polyuria. BP at home < 140/90 Overall good compliance with treatment, and good medicine tolerability.   Past Medical History  Diagnosis Date  . ALLERGIC RHINITIS 11/24/2006  . ANXIETY 07/10/2008  . DIABETES MELLITUS, TYPE II 11/24/2006  . HYPERLIPIDEMIA 11/24/2006   No past surgical history on file.  reports that she has never smoked. She does not have any smokeless tobacco history on file. She reports that she does not drink alcohol or use illicit drugs. family history includes Hypertension in her father. Allergies  Allergen Reactions  . Doxycycline    Current Outpatient Prescriptions on File Prior to Visit  Medication Sig Dispense Refill  . aspirin 81 MG EC tablet Take 1 tablet (81 mg total) by mouth daily. Swallow whole.  30 tablet  12  . cetirizine (ZYRTEC) 10 MG tablet Take 1 tablet (10 mg total) by mouth daily.  30 tablet  11  . fexofenadine (ALLEGRA) 180 MG tablet Take 1 tablet (180 mg total) by mouth daily.  30 tablet  11  . metFORMIN (GLUCOPHAGE-XR) 500 MG 24 hr tablet 3 tabs by mouth in the AM  270 tablet  3  . simvastatin (ZOCOR) 20 MG tablet Take 1 tablet (20 mg total) by mouth every evening.  90 tablet  3   aReview of Systems  Constitutional: Negative for diaphoresis and unexpected weight change.  HENT: Negative for tinnitus.   Eyes: Negative for photophobia and visual disturbance.  Respiratory: Negative for choking and stridor.   Gastrointestinal: Negative for vomiting and blood in stool.  Genitourinary:  Negative for hematuria and decreased urine volume.  Musculoskeletal: Negative for gait problem.  Skin: Negative for color change and wound.  Neurological: Negative for tremors and numbness.  Psychiatric/Behavioral: Negative for decreased concentration. The patient is not hyperactive.       Objective:   Physical Exam BP 162/102  Pulse 94  Temp 97.1 F (36.2 C) (Oral)  Ht 5\' 4"  (1.626 m)  Wt 194 lb (87.998 kg)  BMI 33.30 kg/m2  SpO2 98% Physical Exam  VS noted, midl ill Constitutional: Pt appears well-developed and well-nourished.  HENT: Head: Normocephalic.  Right Ear: External ear normal.  Left Ear: External ear normal.  Bilat tm's mild erythema.  Sinus nontender.  Pharynx mild erythema Eyes: Conjunctivae and EOM are normal. Pupils are equal, round, and reactive to light.  Neck: Normal range of motion. Neck supple.  Cardiovascular: Normal rate and regular rhythm.   Pulmonary/Chest: Effort normal and breath sounds normal.  Neurological: Pt is alert. Not confused  Skin: Skin is warm. No erythema.  Psychiatric: Pt behavior is normal. Thought content normal.     Assessment & Plan:

## 2012-03-06 NOTE — Assessment & Plan Note (Signed)
Mild to mod, for antibx course,  to f/u any worsening symptoms or concerns 

## 2012-03-06 NOTE — Assessment & Plan Note (Signed)
Not charged, but missed colonoscpy sept 2013 - needs to be re-scheduled

## 2012-03-06 NOTE — Assessment & Plan Note (Signed)
stable overall by hx and exam, most recent data reviewed with pt, and pt to continue medical treatment as before Lab Results  Component Value Date   HGBA1C 7.0* 03/06/2012   For labs today

## 2012-03-06 NOTE — Assessment & Plan Note (Signed)
Pt quite convinced her BP at home < 140/90, to continue to monitor, bring BP cuff next visit

## 2012-05-13 ENCOUNTER — Other Ambulatory Visit: Payer: Self-pay

## 2012-06-30 ENCOUNTER — Other Ambulatory Visit: Payer: Self-pay | Admitting: Internal Medicine

## 2012-07-07 ENCOUNTER — Other Ambulatory Visit: Payer: Self-pay | Admitting: Internal Medicine

## 2012-08-28 ENCOUNTER — Other Ambulatory Visit: Payer: Self-pay | Admitting: Internal Medicine

## 2012-08-28 DIAGNOSIS — Z1231 Encounter for screening mammogram for malignant neoplasm of breast: Secondary | ICD-10-CM

## 2012-09-04 ENCOUNTER — Ambulatory Visit: Payer: BC Managed Care – PPO | Admitting: Internal Medicine

## 2012-09-19 ENCOUNTER — Ambulatory Visit (HOSPITAL_COMMUNITY)
Admission: RE | Admit: 2012-09-19 | Discharge: 2012-09-19 | Disposition: A | Discharge status: Home / Self Care | Payer: BC Managed Care – PPO | Source: Ambulatory Visit | Attending: Internal Medicine | Admitting: Internal Medicine

## 2012-09-19 DIAGNOSIS — Z1231 Encounter for screening mammogram for malignant neoplasm of breast: Secondary | ICD-10-CM | POA: Insufficient documentation

## 2012-11-01 ENCOUNTER — Other Ambulatory Visit: Payer: Self-pay

## 2012-12-13 ENCOUNTER — Telehealth: Payer: Self-pay | Admitting: Internal Medicine

## 2012-12-13 ENCOUNTER — Ambulatory Visit (INDEPENDENT_AMBULATORY_CARE_PROVIDER_SITE_OTHER): Payer: BC Managed Care – PPO | Admitting: Internal Medicine

## 2012-12-13 ENCOUNTER — Encounter: Payer: Self-pay | Admitting: Internal Medicine

## 2012-12-13 VITALS — BP 160/90 | HR 111 | Temp 98.5°F

## 2012-12-13 DIAGNOSIS — R030 Elevated blood-pressure reading, without diagnosis of hypertension: Secondary | ICD-10-CM

## 2012-12-13 DIAGNOSIS — E119 Type 2 diabetes mellitus without complications: Secondary | ICD-10-CM

## 2012-12-13 DIAGNOSIS — Z0000 Encounter for general adult medical examination without abnormal findings: Secondary | ICD-10-CM

## 2012-12-13 DIAGNOSIS — T148 Other injury of unspecified body region: Secondary | ICD-10-CM

## 2012-12-13 DIAGNOSIS — M7989 Other specified soft tissue disorders: Secondary | ICD-10-CM

## 2012-12-13 DIAGNOSIS — W57XXXA Bitten or stung by nonvenomous insect and other nonvenomous arthropods, initial encounter: Secondary | ICD-10-CM

## 2012-12-13 MED ORDER — METHYLPREDNISOLONE ACETATE 80 MG/ML IJ SUSP
80.0000 mg | Freq: Once | INTRAMUSCULAR | Status: AC
  Administered 2012-12-13: 80 mg via INTRAMUSCULAR

## 2012-12-13 NOTE — Assessment & Plan Note (Signed)
stable overall by history and exam, recent data reviewed with pt, and pt to continue medical treatment as before,  to f/u any worsening symptoms or concerns Lab Results  Component Value Date   HGBA1C 7.0* 03/06/2012   Pt to monitor cbg's and call for > 200 with steroid tx

## 2012-12-13 NOTE — Patient Instructions (Addendum)
You had the steroid shot today Please take all new medication as prescribed  - the prednisone. And epipen as needed You will be contacted regarding the referral for: left leg venous doppler  Thank you for enrolling in MyChart. Please follow the instructions below to securely access your online medical record. MyChart allows you to send messages to your doctor, view your test results, renew your prescriptions, schedule appointments, and more.  To Log into MyChart, please go to [email or web address], and your Username is: [email or web address]  Please return in 3 months, or sooner if needed, with Lab testing done 3-5 days before

## 2012-12-13 NOTE — Assessment & Plan Note (Signed)
Likely situational, but needs f/u next visit

## 2012-12-13 NOTE — Telephone Encounter (Signed)
Patient Information:  Caller Name: Lujain  Phone: [phone]  Patient: Brittany Oconnor, Brittany Oconnor  Gender: Female  DOB: Jul 13, 1945  Age: 67 Years  PCP: Oliver Barre (Adults only)  Office Follow Up:  Does the office need to follow up with this patient?: No  Instructions For The Office: N/A  RN Note:  Denies drainage. Quarter sized area of redness is improving after Benadryl dose 12/12/12.  Foot is warm to touch.   Symptoms  Reason For Call & Symptoms: Painful, itchy, hot to touch, and swollen area on medial left ankle with pain in arch when walking. Suspects she had an unknown insect bite due to small hole on ankle.  On Metformin and diet control;  does not have glucometer to check blood sugar.  Reviewed Health History In EMR: Yes  Reviewed Medications In EMR: Yes  Reviewed Allergies In EMR: Yes  Reviewed Surgeries / Procedures: Yes  Date of Onset of Symptoms: 12/09/2012  Treatments Tried: Benadryl  Treatments Tried Worked: Yes  Guideline(s) Used:  Rash or Redness - Localized  Insect Bite  Disposition Per Guideline:   See Today in Office  Reason For Disposition Reached:   Red or very tender (to touch) area, and started over 24 hours after the bite  Advice Given:  Local Treatment - Itchy Insect Bites  Apply calamine lotion or a baking soda paste.  If the itch is severe, use 1% hydrocortisone cream. Apply 4 times a day until the itch is less severe, then switch to calamine lotion.  Oral Antihistamine Medication for Severe Itching:   Antihistamines may cause sleepiness. Do not drink, drive, or operate dangerous machinery while taking antihistamines.  An over-the-counter antihistamine that causes less sleepiness is loratadine (e.g., Alavert or Claritin).  Antibiotic Ointment:   Cover the scab with a Band-Aid to prevent scratching and spread.  Repeat washing the sore, the antibiotic ointment, and the Band-Aid 4 times per day until healed.  Expected Course:  Most insect bites are itchy  and puffy for several days.  Any pinkness or redness usually lasts 3 days.  Call Back If:  Bite looks infected (redness, red streaks, increased tenderness)  Redness getting larger and more than 48 hours after the bite  You become worse.  Patient Will Follow Care Advice:  YES  Appointment Scheduled:  12/13/2012 17:00:00 Appointment Scheduled Provider:  Oliver Barre (Adults only)

## 2012-12-13 NOTE — Progress Notes (Signed)
  Subjective:    Patient ID: Brittany Oconnor, female    DOB: 1945-12-31, 67 y.o.   MRN: [id number]  HPI  Here to f/u at urging of husband, states thinks may have had a insect bite to insole x 5 days ago but not sure as she did not see the insect and bite area seems to have resolved, but still has signficant foot and leg swelling below the knee starting at the foot and progressing proximally, with discomfort and itch but no worsening erythema, fever, knee or upper leg swelling, and Pt denies chest pain, increased sob or doe, wheezing, orthopnea, PND, increased RLE swelling, palpitations, dizziness or syncope.  Pt denies fever, wt loss, night sweats, loss of appetite, or other constitutional symptoms Past Medical History  Diagnosis Date  . ALLERGIC RHINITIS 11/24/2006  . ANXIETY 07/10/2008  . DIABETES MELLITUS, TYPE II 11/24/2006  . HYPERLIPIDEMIA 11/24/2006   No past surgical history on file.  reports that she has never smoked. She does not have any smokeless tobacco history on file. She reports that she does not drink alcohol or use illicit drugs. family history includes Hypertension in her father. Allergies  Allergen Reactions  . Doxycycline    Current Outpatient Prescriptions on File Prior to Visit  Medication Sig Dispense Refill  . azithromycin (ZITHROMAX Z-PAK) 250 MG tablet Use as directed  6 each  1  . chlorpheniramine-HYDROcodone (TUSSIONEX PENNKINETIC ER) 10-8 MG/5ML LQCR Take 5 mLs by mouth every 12 (twelve) hours as needed.  140 mL  1  . metFORMIN (GLUCOPHAGE-XR) 500 MG 24 hr tablet TAKE 3 TABLETS EVERY MORNING  270 tablet  2  . simvastatin (ZOCOR) 20 MG tablet TAKE 1 TABLET BY MOUTH EVERY EVENING.  90 tablet  3  . simvastatin (ZOCOR) 20 MG tablet TAKE 1 TABLET BY MOUTH EVERY EVENING.  90 tablet  3  . cetirizine (ZYRTEC) 10 MG tablet Take 1 tablet (10 mg total) by mouth daily.  30 tablet  11   No current facility-administered medications on file prior to visit.   Review of Systems  Constitutional: Negative for unexpected weight change, or unusual diaphoresis  HENT: Negative for tinnitus.   Eyes: Negative for photophobia and visual disturbance.  Respiratory: Negative for choking and stridor.   Gastrointestinal: Negative for vomiting and blood in stool.  Genitourinary: Negative for hematuria and decreased urine volume.  Musculoskeletal: Negative for acute joint swelling Skin: Negative for color change and wound.  Neurological: Negative for tremors and numbness other than noted  Psychiatric/Behavioral: Negative for decreased concentration or  hyperactivity.       Objective:   Physical Exam BP 160/90  Pulse 111  Temp(Src) 98.5 F (36.9 C) (Oral)  SpO2 95% VS noted, not ill appaering Constitutional: Pt appears well-developed and well-nourished.  HENT: Head: NCAT.  Right Ear: External ear normal.  Left Ear: External ear normal.  Eyes: Conjunctivae and EOM are normal. Pupils are equal, round, and reactive to light.  Neck: Normal range of motion. Neck supple.  Cardiovascular: Normal rate and regular rhythm.   Pulmonary/Chest: Effort normal and breath sounds normal.  Neurological: Pt is alert. Not confused  Skin: Skin is warm. No erythema with 1+ edema to leg below left knee without tenderness or erythema, no ulcers or red streaks, no groin LA Dorsalis pedis 1+ bilat Psychiatric: Pt behavior is normal. Thought content normal. mild nervous    Assessment & Plan:

## 2012-12-13 NOTE — Assessment & Plan Note (Signed)
With mild discomfort, ? Angioedema  But cant r/o dvt - for depomedrol IM, predpack asd, epipen prn, and LLE venous doppler

## 2012-12-15 ENCOUNTER — Encounter (INDEPENDENT_AMBULATORY_CARE_PROVIDER_SITE_OTHER): Payer: BC Managed Care – PPO

## 2012-12-15 DIAGNOSIS — M7989 Other specified soft tissue disorders: Secondary | ICD-10-CM

## 2013-02-01 ENCOUNTER — Other Ambulatory Visit: Payer: Self-pay

## 2013-04-04 ENCOUNTER — Ambulatory Visit (INDEPENDENT_AMBULATORY_CARE_PROVIDER_SITE_OTHER): Payer: BC Managed Care – PPO | Admitting: Internal Medicine

## 2013-04-04 ENCOUNTER — Encounter: Payer: Self-pay | Admitting: Internal Medicine

## 2013-04-04 VITALS — BP 136/78 | HR 99 | Temp 101.9°F | Resp 18 | Wt 195.1 lb

## 2013-04-04 DIAGNOSIS — F17200 Nicotine dependence, unspecified, uncomplicated: Secondary | ICD-10-CM

## 2013-04-04 DIAGNOSIS — R059 Cough, unspecified: Secondary | ICD-10-CM | POA: Insufficient documentation

## 2013-04-04 DIAGNOSIS — E119 Type 2 diabetes mellitus without complications: Secondary | ICD-10-CM

## 2013-04-04 DIAGNOSIS — R062 Wheezing: Secondary | ICD-10-CM

## 2013-04-04 DIAGNOSIS — R05 Cough: Secondary | ICD-10-CM | POA: Insufficient documentation

## 2013-04-04 MED ORDER — PREDNISONE 10 MG PO TABS: ORAL_TABLET | ORAL | Status: DC

## 2013-04-04 MED ORDER — HYDROCOD POLST-CHLORPHEN POLST 10-8 MG/5ML PO LQCR: 5.0000 mL | Freq: Two times a day (BID) | ORAL | Status: DC | PRN

## 2013-04-04 MED ORDER — OSELTAMIVIR PHOSPHATE 75 MG PO CAPS: 75.0000 mg | ORAL_CAPSULE | Freq: Two times a day (BID) | ORAL | Status: DC

## 2013-04-04 NOTE — Assessment & Plan Note (Signed)
Urged to quit 

## 2013-04-04 NOTE — Assessment & Plan Note (Signed)
To call for onset polys with steroid tx, o/w .stable Lab Results  Component Value Date   HGBA1C 7.0* 03/06/2012

## 2013-04-04 NOTE — Progress Notes (Signed)
   Subjective:    Patient ID: Brittany Oconnor, female    DOB: 08/22/1945, 68 y.o.   MRN: [id number]  HPI  Here with flu like symptoms of < 2 days onset fever, cough and congestion, scratchy throat, prod cough clear sputum, nausea, no vomiting, and crampy abd pains with  Several watery loose stolls without blood. Pt denies chest pain, increased sob or doe, wheezing, orthopnea, PND, increased LE swelling, palpitations, dizziness or syncope. Past Medical History  Diagnosis Date  . ALLERGIC RHINITIS 11/24/2006  . ANXIETY 07/10/2008  . DIABETES MELLITUS, TYPE II 11/24/2006  . HYPERLIPIDEMIA 11/24/2006   No past surgical history on file.  reports that she has never smoked. She does not have any smokeless tobacco history on file. She reports that she does not drink alcohol or use illicit drugs. family history includes Hypertension in her father. Allergies  Allergen Reactions  . Doxycycline    Current Outpatient Prescriptions on File Prior to Visit  Medication Sig Dispense Refill  . metFORMIN (GLUCOPHAGE-XR) 500 MG 24 hr tablet TAKE 3 TABLETS EVERY MORNING  270 tablet  2  . simvastatin (ZOCOR) 20 MG tablet TAKE 1 TABLET BY MOUTH EVERY EVENING.  90 tablet  3  . cetirizine (ZYRTEC) 10 MG tablet Take 1 tablet (10 mg total) by mouth daily.  30 tablet  11   No current facility-administered medications on file prior to visit.   Current Outpatient Prescriptions on File Prior to Visit  Medication Sig Dispense Refill  . metFORMIN (GLUCOPHAGE-XR) 500 MG 24 hr tablet TAKE 3 TABLETS EVERY MORNING  270 tablet  2  . simvastatin (ZOCOR) 20 MG tablet TAKE 1 TABLET BY MOUTH EVERY EVENING.  90 tablet  3  . cetirizine (ZYRTEC) 10 MG tablet Take 1 tablet (10 mg total) by mouth daily.  30 tablet  11   No current facility-administered medications on file prior to visit.    Review of Systems  Constitutional: Negative for unexpected weight change, or unusual diaphoresis  HENT: Negative for tinnitus.   Eyes:  Negative for photophobia and visual disturbance.  Respiratory: Negative for choking and stridor.   Gastrointestinal: Negative for vomiting and blood in stool.  Genitourinary: Negative for hematuria and decreased urine volume.  Musculoskeletal: Negative for acute joint swelling Skin: Negative for color change and wound.  Neurological: Negative for tremors and numbness other than noted  Psychiatric/Behavioral: Negative for decreased concentration or  hyperactivity.       Objective:   Physical Exam BP 136/78  Pulse 99  Temp(Src) 101.9 F (38.8 C) (Oral)  Resp 18  Wt 195 lb 1.3 oz (88.488 kg)  SpO2 94% VS noted, mild ill Constitutional: Pt appears well-developed and well-nourished.  HENT: Head: NCAT.  Right Ear: External ear normal.  Left Ear: External ear normal.  Bilat tm's with mild erythema.  Max sinus areas mild tender.  Pharynx with mild erythema, no exudate Eyes: Conjunctivae and EOM are normal. Pupils are equal, round, and reactive to light.  Neck: Normal range of motion. Neck supple.  Cardiovascular: Normal rate and regular rhythm.   Pulmonary/Chest: Effort normal and breath sounds normal.  Abd:  Soft, NT, non-distended, + BS Neurological: Pt is alert. Not confused  Skin: Skin is warm. No erythema.  Psychiatric: Pt behavior is normal. Thought content normal.     Assessment & Plan:

## 2013-04-04 NOTE — Progress Notes (Signed)
Pre-visit discussion using our clinic review tool. No additional management support is needed unless otherwise documented below in the visit note.  

## 2013-04-04 NOTE — Assessment & Plan Note (Signed)
?   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

## 2013-04-04 NOTE — Assessment & Plan Note (Signed)
With fever, c/w influenza like illnness at < 48 hrs - for tamilflu asd

## 2013-04-04 NOTE — Patient Instructions (Signed)
Please take all new medication as prescribed - the tamiflu, cough medicine, and prednisone  Please continue all other medications as before, and refills have been done if requested.  Please have the pharmacy call with any other refills you may need.

## 2013-04-05 ENCOUNTER — Telehealth: Payer: Self-pay

## 2013-04-05 NOTE — Telephone Encounter (Signed)
Relevant patient education assigned to patient using Emmi. ° °

## 2013-07-22 ENCOUNTER — Other Ambulatory Visit: Payer: Self-pay | Admitting: Internal Medicine

## 2013-07-31 ENCOUNTER — Other Ambulatory Visit: Payer: Self-pay | Admitting: Internal Medicine

## 2013-08-17 ENCOUNTER — Encounter: Payer: Self-pay | Admitting: Internal Medicine

## 2013-08-17 ENCOUNTER — Other Ambulatory Visit (INDEPENDENT_AMBULATORY_CARE_PROVIDER_SITE_OTHER): Payer: BC Managed Care – PPO

## 2013-08-17 ENCOUNTER — Telehealth: Payer: Self-pay | Admitting: Internal Medicine

## 2013-08-17 ENCOUNTER — Ambulatory Visit (INDEPENDENT_AMBULATORY_CARE_PROVIDER_SITE_OTHER): Payer: BC Managed Care – PPO | Admitting: Internal Medicine

## 2013-08-17 ENCOUNTER — Ambulatory Visit (INDEPENDENT_AMBULATORY_CARE_PROVIDER_SITE_OTHER)
Admission: RE | Admit: 2013-08-17 | Discharge: 2013-08-17 | Disposition: A | Discharge status: Home / Self Care | Payer: BC Managed Care – PPO | Source: Ambulatory Visit | Attending: Internal Medicine | Admitting: Internal Medicine

## 2013-08-17 VITALS — BP 154/96 | HR 102 | Temp 97.6°F | Wt 190.0 lb

## 2013-08-17 DIAGNOSIS — R079 Chest pain, unspecified: Secondary | ICD-10-CM

## 2013-08-17 DIAGNOSIS — F17200 Nicotine dependence, unspecified, uncomplicated: Secondary | ICD-10-CM

## 2013-08-17 LAB — CBC WITH DIFFERENTIAL/PLATELET
BASOS ABS: 0 10*3/uL (ref 0.0–0.1)
Basophils Relative: 0.4 % (ref 0.0–3.0)
Eosinophils Absolute: 0 10*3/uL (ref 0.0–0.7)
Eosinophils Relative: 0.6 % (ref 0.0–5.0)
HCT: 43.6 % (ref 36.0–46.0)
HEMOGLOBIN: 14.5 g/dL (ref 12.0–15.0)
Lymphocytes Relative: 45.2 % (ref 12.0–46.0)
Lymphs Abs: 3.6 10*3/uL (ref 0.7–4.0)
MCHC: 33.2 g/dL (ref 30.0–36.0)
MCV: 84 fl (ref 78.0–100.0)
MONOS PCT: 6.7 % (ref 3.0–12.0)
Monocytes Absolute: 0.5 10*3/uL (ref 0.1–1.0)
Neutro Abs: 3.7 10*3/uL (ref 1.4–7.7)
Neutrophils Relative %: 47.1 % (ref 43.0–77.0)
Platelets: 280 10*3/uL (ref 150.0–400.0)
RBC: 5.19 Mil/uL — ABNORMAL HIGH (ref 3.87–5.11)
RDW: 14 % (ref 11.5–15.5)
WBC: 7.9 10*3/uL (ref 4.0–10.5)

## 2013-08-17 MED ORDER — RANITIDINE HCL 150 MG PO TABS: 150.0000 mg | ORAL_TABLET | Freq: Two times a day (BID) | ORAL | Status: DC

## 2013-08-17 NOTE — Progress Notes (Signed)
Pre visit review using our clinic review tool, if applicable. No additional management support is needed unless otherwise documented below in the visit note. 

## 2013-08-17 NOTE — Patient Instructions (Signed)
Your next office appointment will be determined based upon review of your pending labs & x-rays. Those instructions will be transmitted to you through My Chart  Followup as needed for your acute issue. Please report any significant change in your symptoms.To ER if worse   Reflux of gastric acid may be asymptomatic as this may occur mainly during sleep.The triggers for reflux  include stress; the "aspirin family" ; alcohol; peppermint; and caffeine (coffee, tea, cola, and chocolate). The aspirin family would include aspirin and the nonsteroidal agents such as ibuprofen &  Naproxen. Tylenol would not cause reflux. If having symptoms ; food & drink should be avoided for @ least 2 hours before going to bed. Take the ranitidine 30 minutes before breakfast and evening meal.   Please think about quitting smoking. Review the risks we discussed. Please call 1-800-QUIT-NOW (640-444-4467) for free smoking cessation counseling.

## 2013-08-17 NOTE — Telephone Encounter (Signed)
Patient Information:  Caller Name: Shakeya  Phone: [phone]  Patient: Brittany Oconnor, Brittany Oconnor  Gender: Female  DOB: 06-09-1945  Age: 68 Years  PCP: Oliver Barre (Adults only)  Office Follow Up:  Does the office need to follow up with this patient?: No  Instructions For The Office: N/A  RN Note:  Patient calling c/o chest & back pain x 1 week.  Admits to increase in activity stating "I had a Sister who passed and I went and cleaned out her home, I think I've over done it."  Denies SOB, sweating or any acute symtpoms.  Symptoms  Reason For Call & Symptoms: c/o chest pain discomfort  Reviewed Health History In EMR: Yes  Reviewed Medications In EMR: Yes  Reviewed Allergies In EMR: Yes  Reviewed Surgeries / Procedures: Yes  Date of Onset of Symptoms: 08/10/2013  Guideline(s) Used:  Chest Pain  Disposition Per Guideline:   See Today in Office  Reason For Disposition Reached:   Intermittent chest pains persist > 3 days  Advice Given:  Call Back If:  Severe chest pain  Constant chest pain lasting longer than 5 minutes  Difficulty breathing  You become worse.  Patient Will Follow Care Advice:  YES  Appointment Scheduled:  08/17/2013 13:00:38 Appointment Scheduled Provider:  Other

## 2013-08-17 NOTE — Progress Notes (Signed)
Subjective:    Patient ID: Brittany Oconnor, female    DOB: 1945/08/03, 68 y.o.   MRN: [id number]  HPI   She flew back from [place] 08/10/13  after a week there. 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 Subsequent to that she developed right sternal pain described as dull with radiation to the back. This would last 2 minutes or less  It is better if she relaxes. She feels it is worse when she is under mental stress is work.  She's has had 5 episodes today. She probably has had  "35" episodes in the last week  Last week she did have some nausea; but she does not have nausea and sweating per se with the pain. It is not exertional  She has had some belching but denies significant dyspepsia  She smokes half a pack a day  There is no family history of myocardial infarction. Her mother had a stroke at 43  Review of Systems  She specifically denies dysphagia, abdominal pain, unexplained weight loss, melena, rectal bleeding   The pain is not associated with cough or sputum production. She's had no hemoptysis  There's been no rash or change in color or temperature of the skin over the area of the pain.        Objective:   Physical Exam Gen.: Healthy and well-nourished in appearance. Alert, appropriate and cooperative throughout exam. Appears younger than stated age  Head: Normocephalic without obvious abnormalities Eyes: No corneal or conjunctival inflammation noted. Pupils equal round reactive to light and accommodation. Extraocular motion intact. Fundal exam is benign without hemorrhages, exudate, papilledema.  Vision grossly normal with /w/o lenses Ears: External  ear exam reveals no significant lesions or deformities. Canals clear .TMs normal. Hearing is grossly normal bilaterally. Nose: External nasal exam reveals no deformity or inflammation. Nasal mucosa are pink and moist. No lesions or exudates noted.   Mouth: Oral mucosa and  oropharynx reveal no lesions or exudates. She has complete dentures Neck: No deformities, masses, or tenderness noted. Range of motion & Thyroid normal. Lungs: Normal respiratory effort; chest expands symmetrically. Lungs are clear to auscultation without rales, wheezes, or increased work of breathing. She has a slight rattly cough. There is no pain to direct pressure over the sternum to suggest costochondritis. Heart: Normal rate and rhythm. Normal S1 and S2. No gallop, click, or rub. S4 w/o murmur. Abdomen: Bowel sounds normal; abdomen soft and nontender. No masses, organomegaly or hernias noted.                            Musculoskeletal/extremities: No deformity or scoliosis noted of  the thoracic or lumbar spine.  Slight clubbing w/o cyanosis, edema, or significant extremity  deformity noted. Range of motion normal .Tone & strength normal. Hand joints normal.There is mild crepitus of the knees.  Fingernail health good. Able to lie down & sit up w/o help. Negative SLR bilaterally. Homan's neg Vascular: Carotid, radial artery, dorsalis pedis and  posterior tibial pulses are full and equal. No bruits present. Neurologic: Alert and oriented x3. Deep tendon reflexes symmetrical and normal.  Gait normal . Skin: Intact without suspicious lesions or rashes. Lymph: No cervical, axillary lymphadenopathy present. Psych: Mood and affect are normal. Normally interactive  Assessment & Plan:  #1 atypical chest pain  #2 active smoker  #3 exogenous stress  Plan see orders

## 2013-08-18 LAB — TROPONIN I: Troponin I: 0.01 ng/mL (ref <0.06)

## 2013-08-18 LAB — D-DIMER, QUANTITATIVE: D-Dimer, Quant: 0.35 ug/mL-FEU (ref 0.00–0.48)

## 2013-09-07 ENCOUNTER — Ambulatory Visit (INDEPENDENT_AMBULATORY_CARE_PROVIDER_SITE_OTHER): Payer: BC Managed Care – PPO | Admitting: Internal Medicine

## 2013-09-07 ENCOUNTER — Other Ambulatory Visit (INDEPENDENT_AMBULATORY_CARE_PROVIDER_SITE_OTHER): Payer: BC Managed Care – PPO

## 2013-09-07 ENCOUNTER — Ambulatory Visit (INDEPENDENT_AMBULATORY_CARE_PROVIDER_SITE_OTHER)
Admission: RE | Admit: 2013-09-07 | Discharge: 2013-09-07 | Disposition: A | Discharge status: Home / Self Care | Payer: BC Managed Care – PPO | Source: Ambulatory Visit | Attending: Internal Medicine | Admitting: Internal Medicine

## 2013-09-07 ENCOUNTER — Encounter: Payer: Self-pay | Admitting: Internal Medicine

## 2013-09-07 VITALS — BP 140/80 | HR 123 | Temp 97.7°F | Wt 188.4 lb

## 2013-09-07 DIAGNOSIS — E119 Type 2 diabetes mellitus without complications: Secondary | ICD-10-CM

## 2013-09-07 DIAGNOSIS — J209 Acute bronchitis, unspecified: Secondary | ICD-10-CM

## 2013-09-07 DIAGNOSIS — J440 Chronic obstructive pulmonary disease with acute lower respiratory infection: Secondary | ICD-10-CM

## 2013-09-07 LAB — CBC WITH DIFFERENTIAL/PLATELET
BASOS PCT: 0.4 % (ref 0.0–3.0)
Basophils Absolute: 0 10*3/uL (ref 0.0–0.1)
EOS PCT: 1.4 % (ref 0.0–5.0)
Eosinophils Absolute: 0.1 10*3/uL (ref 0.0–0.7)
HCT: 42 % (ref 36.0–46.0)
Hemoglobin: 14 g/dL (ref 12.0–15.0)
LYMPHS PCT: 38.9 % (ref 12.0–46.0)
Lymphs Abs: 2.4 10*3/uL (ref 0.7–4.0)
MCHC: 33.3 g/dL (ref 30.0–36.0)
MCV: 83.2 fl (ref 78.0–100.0)
Monocytes Absolute: 0.7 10*3/uL (ref 0.1–1.0)
Monocytes Relative: 12.2 % — ABNORMAL HIGH (ref 3.0–12.0)
NEUTROS PCT: 47.1 % (ref 43.0–77.0)
Neutro Abs: 2.9 10*3/uL (ref 1.4–7.7)
Platelets: 251 10*3/uL (ref 150.0–400.0)
RBC: 5.05 Mil/uL (ref 3.87–5.11)
RDW: 13.8 % (ref 11.5–15.5)
WBC: 6.1 10*3/uL (ref 4.0–10.5)

## 2013-09-07 LAB — HEMOGLOBIN A1C: HEMOGLOBIN A1C: 6.8 % — AB (ref 4.6–6.5)

## 2013-09-07 MED ORDER — AMOXICILLIN 500 MG PO CAPS: 500.0000 mg | ORAL_CAPSULE | Freq: Three times a day (TID) | ORAL | Status: DC

## 2013-09-07 MED ORDER — UMECLIDINIUM-VILANTEROL 62.5-25 MCG/INH IN AEPB: INHALATION_SPRAY | RESPIRATORY_TRACT | Status: DC

## 2013-09-07 MED ORDER — HYDROCODONE-HOMATROPINE 5-1.5 MG/5ML PO SYRP: 5.0000 mL | ORAL_SOLUTION | Freq: Four times a day (QID) | ORAL | Status: DC | PRN

## 2013-09-07 NOTE — Progress Notes (Signed)
   Subjective:    Patient ID: Brittany Oconnor, female    DOB: 1946/02/26, 68 y.o.   MRN: [id number]  HPI    She became acutely congested in her head and chest after she turned the air conditioning on jn her car with it blowing straight into her face. She had significant rhinitis; greater head than chest congestion .The sputum has been clear to slightly yellow. She's had chills and sweats.  There's been no exposure to any ill individuals. She has no other dust or pollen exposures.  She used Claritin which helped the nose.  She continues to have a cough with clear sputum, wheezing, & shortness of breath  She smokes half a pack per day. She has no history of asthma  She is on metformin but does not check sugars. The last A1c on record was 7 in December 2013.    Review of Systems  She denies fever sweats chills and sweats  She has no frontal headache, facial pain, otic pain, or otic discharge  There is no nasal purulence       Objective:   Physical Exam  Significant or distinguishing  findings on physical exam are documented first.  Below that are other systems examined & findings.   There is slight wax in the otic canals; tympanic membranes visualized appeared normal.  She has complete dentures  She has an S4 which is distant due to coarse rhonchi and wheezing in all lung fields.   General appearance:good health ;well nourished but obesity present as per CDC Guidelines. In no  acute distress or increased work of breathing is present.  No  lymphadenopathy about the head, neck, or axilla noted.   Eyes: No conjunctival inflammation or lid edema is present. There is no scleral icterus.  Ears:  External ear exam shows no significant lesions or deformities.    Nose:  External nasal examination shows no deformity or inflammation. Nasal mucosa are pink and moist without lesions or exudates. No septal dislocation or deviation.No obstruction to airflow.   Oral exam:  lips and  gums are healthy appearing.There is no oropharyngeal erythema or exudate noted.   Neck:  No deformities, thyromegaly, masses, or tenderness noted.   Supple with full range of motion without pain.   Heart:  Normal rate and regular rhythm. S1 and S2 normal without gallop, murmur, click, or rub.   Lungs:Chest clear to auscultation; no wheezes, rhonchi,rales ,or rubs present.No increased work of breathing.    Extremities:  No cyanosis, edema, or clubbing  noted    Skin: Warm & dry w/o jaundice or tenting.       Assessment & Plan:  #1 COPD exacerbation with marked bronchospasm  #2 diabetes, questionable status. I would be hesitant to use oral steroids without knowledge of her diabetic status currently.  See orders and recommendations.

## 2013-09-07 NOTE — Patient Instructions (Signed)
Your next office appointment will be determined based upon review of your pending labs & x-rays. Those instructions will be transmitted to you through My Chart  OR  by mail;whichever process is your choice to receive results & recommendations .  Plain Mucinex (NOT D) for thick secretions ;force NON dairy fluids .   Nasal cleansing in the shower as discussed with lather of mild shampoo.After 10 seconds wash off lather while  exhaling through nostrils. Make sure that all residual soap is removed to prevent irritation.  Flonase OR Nasacort AQ 1 spray in each nostril twice a day as needed. Use the "crossover" technique into opposite nostril spraying toward opposite ear @ 45 degree angle, not straight up into nostril.  Use a Neti pot daily only  as needed for significant sinus congestion; going from open side to congested side . Plain Allegra (NOT D )  160 daily , Loratidine 10 mg , OR Zyrtec 10 mg @ bedtime  as needed for itchy eyes & sneezing.  Anoro one inhalation daily; gargle and spit after use. Lot #: [id number] Exp 06/16 Instructions written on box by Dr Lennart PallWm Hopper

## 2013-09-07 NOTE — Progress Notes (Signed)
Pre visit review using our clinic review tool, if applicable. No additional management support is needed unless otherwise documented below in the visit note. 

## 2013-09-08 DIAGNOSIS — E669 Obesity, unspecified: Secondary | ICD-10-CM | POA: Insufficient documentation

## 2013-10-01 ENCOUNTER — Telehealth: Payer: Self-pay | Admitting: *Deleted

## 2013-10-01 DIAGNOSIS — E119 Type 2 diabetes mellitus without complications: Secondary | ICD-10-CM

## 2013-10-01 NOTE — Telephone Encounter (Signed)
Patient to come in for lab work. Lipid ordered Diabetic bundle.

## 2013-10-09 ENCOUNTER — Ambulatory Visit (INDEPENDENT_AMBULATORY_CARE_PROVIDER_SITE_OTHER): Payer: BC Managed Care – PPO | Admitting: Internal Medicine

## 2013-10-09 ENCOUNTER — Encounter: Payer: Self-pay | Admitting: Internal Medicine

## 2013-10-09 VITALS — BP 120/72 | HR 107 | Temp 98.2°F | Ht 64.0 in | Wt 187.0 lb

## 2013-10-09 DIAGNOSIS — J440 Chronic obstructive pulmonary disease with acute lower respiratory infection: Secondary | ICD-10-CM

## 2013-10-09 DIAGNOSIS — G5600 Carpal tunnel syndrome, unspecified upper limb: Secondary | ICD-10-CM

## 2013-10-09 DIAGNOSIS — G5601 Carpal tunnel syndrome, right upper limb: Secondary | ICD-10-CM

## 2013-10-09 DIAGNOSIS — J209 Acute bronchitis, unspecified: Secondary | ICD-10-CM

## 2013-10-09 DIAGNOSIS — F411 Generalized anxiety disorder: Secondary | ICD-10-CM

## 2013-10-09 MED ORDER — HYDROCODONE-HOMATROPINE 5-1.5 MG/5ML PO SYRP: 5.0000 mL | ORAL_SOLUTION | Freq: Four times a day (QID) | ORAL | Status: DC | PRN

## 2013-10-09 MED ORDER — LEVOFLOXACIN 250 MG PO TABS: 250.0000 mg | ORAL_TABLET | Freq: Every day | ORAL | Status: DC

## 2013-10-09 NOTE — Progress Notes (Signed)
Pre visit review using our clinic review tool, if applicable. No additional management support is needed unless otherwise documented below in the visit note. 

## 2013-10-09 NOTE — Progress Notes (Signed)
Subjective:    Patient ID: Brittany Oconnor, female    DOB: 18-Feb-1946, 68 y.o.   MRN: [id number]  HPI Here with acute onset mild to mod 2-3 days ST, HA, general weakness and malaise, with prod cough greenish sputum, but Pt denies chest pain, increased sob or doe, wheezing, orthopnea, PND, increased LE swelling, palpitations, dizziness or syncope. Was better then worse again after tx per Dr Alwyn RenHopper last visit, then [place] [place].  Has not had to use inhaler per Dr Alwyn RenHopper, No hx of signficant allergies, still smokes occas, recent cxr neg for acute June 2015  Also with 2 mo worsening right cts symptoms of tingling without numbness or weakness, worse in the am, sometimes wakes her up at night.  Denies worsening depressive symptoms, suicidal ideation, or panic; has ongoing anxiety. Past Medical History  Diagnosis Date  . ALLERGIC RHINITIS 11/24/2006  . ANXIETY 07/10/2008  . DIABETES MELLITUS, TYPE II 11/24/2006  . HYPERLIPIDEMIA 11/24/2006   No past surgical history on file.  reports that she has never smoked. She does not have any smokeless tobacco history on file. She reports that she does not drink alcohol or use illicit drugs. family history includes Hypertension in her father. Allergies  Allergen Reactions  . Doxycycline    Current Outpatient Prescriptions on File Prior to Visit  Medication Sig Dispense Refill  . aspirin 81 MG tablet Take 81 mg by mouth daily.      . metFORMIN (GLUCOPHAGE-XR) 500 MG 24 hr tablet TAKE 3 TABLETS EVERY MORNING  270 tablet  2  . ranitidine (ZANTAC) 150 MG tablet Take 1 tablet (150 mg total) by mouth 2 (two) times daily.  60 tablet  2  . simvastatin (ZOCOR) 20 MG tablet TAKE 1 TABLET BY MOUTH EVERY EVENING.  90 tablet  2  . Umeclidinium-Vilanterol (ANORO ELLIPTA) 62.5-25 MCG/INH AEPB Anoro  one inhalations daily; gargle and spit after use. Lot #:[id number] Exp 06/16 Instructions written on box by Dr Lennart PallWm Hopper  7 each  0   No current  facility-administered medications on file prior to visit.   Review of Systems  Constitutional: Negative for unusual diaphoresis or other sweats  HENT: Negative for ringing in ear Eyes: Negative for double vision or worsening visual disturbance.  Respiratory: Negative for choking and stridor.   Gastrointestinal: Negative for vomiting or other signifcant bowel change Genitourinary: Negative for hematuria or decreased urine volume.  Musculoskeletal: Negative for other MSK pain or swelling Skin: Negative for color change and worsening wound.  Neurological: Negative for tremors and numbness other than noted  Psychiatric/Behavioral: Negative for decreased concentration or agitation other than above       Objective:   Physical Exam BP 120/72  Pulse 107  Temp(Src) 98.2 F (36.8 C) (Oral)  Ht 5\' 4"  (1.626 m)  Wt 187 lb (84.823 kg)  BMI 32.08 kg/m2  SpO2 98% VS noted, mild ill Constitutional: Pt appears well-developed, well-nourished.  HENT: Head: NCAT.  Right Ear: External ear normal.  Left Ear: External ear normal.  Eyes: . Pupils are equal, round, and reactive to light. Conjunctivae and EOM are normal Bilat tm's with mild erythema.  Max sinus areas mild tender.  Pharynx with mild erythema, no exudate Neck: Normal range of motion. Neck supple.  Cardiovascular: Normal rate and regular rhythm.   Pulmonary/Chest: Effort normal and breath sounds normal.  Neurological: Pt is alert. Not confused , motor grossly intact, sens/dtr intact Skin: Skin is warm. No rash  Psychiatric: Pt behavior is normal. No agitation.     Assessment & Plan:

## 2013-10-09 NOTE — Patient Instructions (Signed)
Please take all new medication as prescribed  - the antibiotic, and the cough medicine  Please use a right wrist splint at night to help with the possible right carpal tunnel   Please continue all other medications as before, and refills have been done if requested.  Please have the pharmacy call with any other refills you may need.  Please continue your efforts at being more active, low cholesterol diet, and weight control.  You are otherwise up to date with prevention measures today.  Please keep your appointments with your specialists as you may have planned

## 2013-10-13 DIAGNOSIS — G5601 Carpal tunnel syndrome, right upper limb: Secondary | ICD-10-CM | POA: Insufficient documentation

## 2013-10-13 DIAGNOSIS — J440 Chronic obstructive pulmonary disease with acute lower respiratory infection: Secondary | ICD-10-CM

## 2013-10-13 DIAGNOSIS — J209 Acute bronchitis, unspecified: Secondary | ICD-10-CM | POA: Insufficient documentation

## 2013-10-13 NOTE — Assessment & Plan Note (Signed)
O/w stable, does not appear to need steroid tx at this time

## 2013-10-13 NOTE — Assessment & Plan Note (Signed)
Mild, exam benign, for right wrist splint qhs,  to f/u any worsening symptoms or concerns

## 2013-10-13 NOTE — Assessment & Plan Note (Signed)
Mild to mod, for antibx course,  to f/u any worsening symptoms or concerns 

## 2013-10-13 NOTE — Assessment & Plan Note (Signed)
stable overall by history and exam, and pt to continue medical treatment as before,  to f/u any worsening symptoms or concerns 

## 2013-10-25 ENCOUNTER — Other Ambulatory Visit: Payer: Self-pay | Admitting: Internal Medicine

## 2013-10-25 DIAGNOSIS — Z1231 Encounter for screening mammogram for malignant neoplasm of breast: Secondary | ICD-10-CM

## 2013-11-01 ENCOUNTER — Ambulatory Visit (HOSPITAL_COMMUNITY)
Admission: RE | Admit: 2013-11-01 | Discharge: 2013-11-01 | Disposition: A | Discharge status: Home / Self Care | Payer: BC Managed Care – PPO | Source: Ambulatory Visit | Attending: Internal Medicine | Admitting: Internal Medicine

## 2013-11-01 DIAGNOSIS — Z1231 Encounter for screening mammogram for malignant neoplasm of breast: Secondary | ICD-10-CM | POA: Insufficient documentation

## 2013-11-01 LAB — HM MAMMOGRAPHY

## 2013-12-27 ENCOUNTER — Ambulatory Visit (INDEPENDENT_AMBULATORY_CARE_PROVIDER_SITE_OTHER): Payer: BC Managed Care – PPO | Admitting: Internal Medicine

## 2013-12-27 ENCOUNTER — Encounter: Payer: Self-pay | Admitting: Internal Medicine

## 2013-12-27 VITALS — BP 158/80 | HR 113 | Temp 98.2°F | Resp 18 | Ht 64.0 in | Wt 184.1 lb

## 2013-12-27 DIAGNOSIS — J302 Other seasonal allergic rhinitis: Secondary | ICD-10-CM | POA: Diagnosis not present

## 2013-12-27 MED ORDER — FLUTICASONE PROPIONATE 50 MCG/ACT NA SUSP: 2.0000 | Freq: Every day | NASAL | Status: DC

## 2013-12-27 NOTE — Progress Notes (Signed)
Pre visit review using our clinic review tool, if applicable. No additional management support is needed unless otherwise documented below in the visit note. 

## 2013-12-27 NOTE — Patient Instructions (Addendum)
We will have you use a nose spray called flonase (also called fluticasone). Use 2 sprays in each nostril twice a day for the next 1-2 weeks. Once your congestion is gone you can use the spray once per day to keep it from coming back.  We will clean out your ears today and this should help with the discomfort.   Call us back if you are not feeling better or if you are feeling worse.

## 2013-12-27 NOTE — Progress Notes (Signed)
   Subjective:    Patient ID: Brittany Oconnor, female    DOB: [birth year], 68 y.o.   MRN: [id number]  HPI The patient is a 68 YO female who is coming in today for ear pain. PMH, medications, allergies reviewed in chart. She is also having some nasal congestion with drainage down her throat. Her throat is not sore and she denies cough. At night she does occasionally cough but able to suppress it. She denies fevers, chills, facial pain, headaches.   Review of Systems  Constitutional: Negative for fever, chills, activity change and appetite change.  HENT: Positive for congestion, ear pain and rhinorrhea. Negative for ear discharge, facial swelling, postnasal drip, sinus pressure, sneezing and sore throat.   Respiratory: Negative for cough, chest tightness, shortness of breath and wheezing.   Cardiovascular: Negative for chest pain, palpitations and leg swelling.  Gastrointestinal: Negative for abdominal pain, diarrhea, constipation and abdominal distention.      Objective:   Physical Exam  Vitals reviewed. Constitutional: She appears well-developed and well-nourished.  HENT:  Head: Normocephalic and atraumatic.  Right Ear: External ear normal.  Left Ear: External ear normal.  Some erythema in the canals of the ears, no pain with tugging on the ears. Oropharynx with slight erythema no drainage.   Eyes: EOM are normal.  Neck: Normal range of motion. Neck supple.  Cardiovascular: Normal rate and regular rhythm.   Pulmonary/Chest: Effort normal and breath sounds normal. No respiratory distress. She has no wheezes. She has no rales.  Abdominal: Soft. Bowel sounds are normal.  Lymphadenopathy:    She has no cervical adenopathy.      Assessment & Plan:

## 2013-12-28 NOTE — Assessment & Plan Note (Signed)
No antibiotics needed at this time. Will trial flonase for the congestion and advised she can use her netty pot at home if she wants. No signs of ear infection or excessive wax.

## 2013-12-31 ENCOUNTER — Ambulatory Visit (INDEPENDENT_AMBULATORY_CARE_PROVIDER_SITE_OTHER): Payer: BC Managed Care – PPO | Admitting: Internal Medicine

## 2013-12-31 ENCOUNTER — Encounter: Payer: Self-pay | Admitting: Internal Medicine

## 2013-12-31 VITALS — BP 138/80 | HR 118 | Temp 98.4°F | Wt 183.5 lb

## 2013-12-31 DIAGNOSIS — B0229 Other postherpetic nervous system involvement: Secondary | ICD-10-CM

## 2013-12-31 DIAGNOSIS — B028 Zoster with other complications: Principal | ICD-10-CM

## 2013-12-31 MED ORDER — GABAPENTIN 100 MG PO CAPS: ORAL_CAPSULE | ORAL | Status: DC

## 2013-12-31 MED ORDER — FAMCICLOVIR 500 MG PO TABS: 500.0000 mg | ORAL_TABLET | Freq: Three times a day (TID) | ORAL | Status: DC

## 2013-12-31 NOTE — Progress Notes (Signed)
   Subjective:    Patient ID: Brittany Oconnor, female    DOB: 02-21-1946, 68 y.o.   MRN: [id number]  HPI   She began to have small vesicles as of Thursday10/1. These have progressed to involve the left posterior neck and left side of face.  She's had no associated visual changes.  She did have the shingles shot.    Review of Systems   She denies fever, chills, sweats  She has no blurred vision, double vision, loss of vision  There's been no associated tinnitus or hearing loss.     Objective:   Physical Exam   Pertinent or positive findings include: She has classic herpetic lesions over the left cheek, ear, and posterior neck in a C 2-3 distribution. Repeat pulses check was 96. Extraocular motion is intact as is vision  General appearance :adequately nourished; in no distress. Eyes: No conjunctival inflammation or scleral icterus is present. Oral exam: Dentures. Lips and gums are healthy appearing.There is no oropharyngeal erythema or exudate noted.  Heart:  regular rhythm. S1 and S2 normal without gallop, murmur, click, rub or other extra sounds   Lungs:Chest clear to auscultation; no wheezes, rhonchi,rales ,or rubs present.No increased work of breathing.  Lymphatic: No lymphadenopathy is noted about the head, neck, axilla             Assessment & Plan:  #1 C2-3 herpes zoster  The pathophysiology of herpes zoster infection was discussed. Restrictions were also delineated. Medications prescribed and indications discussed.

## 2013-12-31 NOTE — Progress Notes (Signed)
Pre visit review using our clinic review tool, if applicable. No additional management support is needed unless otherwise documented below in the visit note. 

## 2013-12-31 NOTE — Patient Instructions (Signed)
Avoid individuals who are immunocompromised due to HIV or due to active chemotherapy. Please remain out of work until 01/07/14.

## 2014-01-09 ENCOUNTER — Telehealth: Payer: Self-pay | Admitting: Internal Medicine

## 2014-01-09 NOTE — Telephone Encounter (Signed)
Patient Information:  Caller Name: Liba  Phone: [phone]  Patient: Brittany Oconnor, Brittany Oconnor  Gender: Female  DOB: 02-07-1946  Age: 68 Years  PCP: Oliver BarreJohn, James (Adults only)  Office Follow Up:  Does the office need to follow up with this patient?: No  Instructions For The Office: N/A   Symptoms  Reason For Call & Symptoms: Pt states she was diagnosed with shingles last week and would like to know what she can put on the scabs because her hair is getting caught in them (located at hairline and back of ear).   Per health education resource, advised pt she can apply an antibacterial oinment on scabs that may help soften so hair doesn't get caught in scabs or a cool wet washcloth.   Pt agrees and has no further questions.  Reviewed Health History In EMR: Yes  Reviewed Medications In EMR: Yes  Reviewed Allergies In EMR: Yes  Reviewed Surgeries / Procedures: Yes  Date of Onset of Symptoms: 01/09/2014  Guideline(s) Used:  No Protocol Available - Information Only  Disposition Per Guideline:   Home Care  Reason For Disposition Reached:   Information only question and nurse able to answer  Advice Given:  N/A  Patient Will Follow Care Advice:  YES

## 2014-01-11 ENCOUNTER — Other Ambulatory Visit: Payer: Self-pay

## 2014-01-14 ENCOUNTER — Telehealth: Payer: Self-pay | Admitting: Internal Medicine

## 2014-01-14 NOTE — Telephone Encounter (Signed)
Patient Information:  Caller Name: Biviana  Phone: [phone]  Patient: Brittany Oconnor, Brittany Oconnor  Gender: Female  DOB: 02/01/1946  Age: 68 Years  PCP: Oliver BarreJohn, James (Adults only)  Office Follow Up:  Does the office need to follow up with this patient?: No  Instructions For The Office: N/A  RN Note:  Patient calling regarding recent treatment of Shingles.  Wants to know if she can continue to use neosporin for itching.  She has completed all Famvir and denies any new lesions.  States "The area in my hair just continues to itch some."  Currently taking Gabapentin and using neosporin as directed.   Patient admits she has obtained relief with neosporin.  Reassured.  Encouraged to call PRN.  Symptoms  Reason For Call & Symptoms: shingles follow up  Reviewed Health History In EMR: Yes  Reviewed Medications In EMR: Yes  Reviewed Allergies In EMR: Yes  Reviewed Surgeries / Procedures: Yes  Date of Onset of Symptoms: 01/14/2014  Guideline(s) Used:  No Protocol Available - Information Only  Disposition Per Guideline:   Home Care  Reason For Disposition Reached:   Information only question and nurse able to answer  Advice Given:  Call Back If:  New symptoms develop  Patient Will Follow Care Advice:  YES

## 2014-02-05 ENCOUNTER — Telehealth: Payer: Self-pay | Admitting: Internal Medicine

## 2014-02-05 NOTE — Telephone Encounter (Signed)
Please try otc Benadryl cream prn - otc

## 2014-02-05 NOTE — Telephone Encounter (Signed)
Pt called in said that the places where the shingles were is itching her so bad and wanted to know if there is anything she could put on it to keep her from itching ?

## 2014-02-06 NOTE — Telephone Encounter (Signed)
Patient informed. 

## 2014-03-27 ENCOUNTER — Ambulatory Visit (INDEPENDENT_AMBULATORY_CARE_PROVIDER_SITE_OTHER): Payer: BC Managed Care – PPO | Admitting: Internal Medicine

## 2014-03-27 ENCOUNTER — Encounter: Payer: Self-pay | Admitting: Internal Medicine

## 2014-03-27 VITALS — BP 148/90 | HR 124 | Temp 98.7°F | Resp 16 | Wt 179.0 lb

## 2014-03-27 DIAGNOSIS — B0229 Other postherpetic nervous system involvement: Secondary | ICD-10-CM | POA: Diagnosis not present

## 2014-03-27 DIAGNOSIS — J069 Acute upper respiratory infection, unspecified: Secondary | ICD-10-CM

## 2014-03-27 DIAGNOSIS — J209 Acute bronchitis, unspecified: Secondary | ICD-10-CM

## 2014-03-27 MED ORDER — GABAPENTIN 100 MG PO CAPS: ORAL_CAPSULE | ORAL | Status: DC

## 2014-03-27 MED ORDER — AZITHROMYCIN 250 MG PO TABS: ORAL_TABLET | ORAL | Status: DC

## 2014-03-27 MED ORDER — HYDROCODONE-HOMATROPINE 5-1.5 MG/5ML PO SYRP: 5.0000 mL | ORAL_SOLUTION | Freq: Four times a day (QID) | ORAL | Status: DC | PRN

## 2014-03-27 NOTE — Progress Notes (Signed)
Pre visit review using our clinic review tool, if applicable. No additional management support is needed unless otherwise documented below in the visit note. 

## 2014-03-27 NOTE — Progress Notes (Signed)
   Subjective:    Patient ID: Brittany Oconnor, female    DOB: Dec 22, 1945, 68 y.o.   MRN: [id number]  HPI  As of 03/21/14 she began to have a cough productive of white/clear sputum.  Her husband had been sick with a respiratory tract infection.  She has had some associated shortness of breath with paroxysms of coughing  She also has exertional dyspnea with stairs  Yesterday she had some frontal headache but this has not persisted.  She has no other symptoms of upper respiratory tract infection.  She continues to smoke one half pack per day.  She's not on Ace inhibitor  She is not checking her glucoses; her last A1c was 6.8% in June of this year. She plans follow-up in the near future.  Review of Systems Frontal headache, facial pain , nasal purulence, dental pain, sore throat , otic pain or otic discharge denied. No fever , chills or sweats. Extrinsic symptoms of itchy, watery eyes, sneezing, or angioedema are denied.    She describes intermittent tingling sensation in the area of the previous herpes zoster at the left posterior neck. Gabapentin had been of benefit for this; but she has run out of this prescription.     Objective:   Physical Exam Pertinent or positive findings include: Recheck of pulse revealed a rate of 100. She has complete dentures Hyponasal speech is present She has a small amount of wax in otic canals. Decreased breath sounds. Irregular hyperpigmented post zoster scarring is noted over the upper back and neck on the left.  She has paroxysms of cough which is nonproductive.  General appearance:good health ;well nourished; no acute distress or increased work of breathing is present.  No  lymphadenopathy about the head, neck, or axilla noted.  Eyes: No conjunctival inflammation or lid edema is present. There is no scleral icterus. Nose:  External nasal examination shows no deformity or inflammation. Nasal mucosa are dry without lesions or exudates. No  septal dislocation or deviation.No obstruction to airflow.  Oral exam: lips and gums are healthy appearing.There is no oropharyngeal erythema or exudate noted.  Neck:  No deformities, thyromegaly, masses, or tenderness noted.   Supple with full range of motion without pain.  Heart:  regular rhythm. S1 and S2 normal without murmur, click, rub or other extra sounds.  Lungs:Chest clear to auscultation; no wheezes, rhonchi,rales ,or rubs present.No increased work of breathing.   Extremities:  No cyanosis, edema, or clubbing  noted  Skin: Warm & dry w/o  tenting.        Assessment & Plan:  #1 acute bronchitis w/o bronchospasm #2 URI, acute #3 smoker #4 post herpetic neuralgia Plan: See orders and recommendations

## 2014-03-27 NOTE — Patient Instructions (Signed)

## 2014-03-28 ENCOUNTER — Telehealth: Payer: Self-pay | Admitting: Internal Medicine

## 2014-03-28 NOTE — Telephone Encounter (Signed)
emmi emailed °

## 2014-04-25 ENCOUNTER — Other Ambulatory Visit: Payer: Self-pay | Admitting: Internal Medicine

## 2014-05-17 ENCOUNTER — Encounter: Payer: Self-pay | Admitting: Internal Medicine

## 2014-05-17 ENCOUNTER — Other Ambulatory Visit: Payer: Self-pay | Admitting: *Deleted

## 2014-05-17 ENCOUNTER — Ambulatory Visit (INDEPENDENT_AMBULATORY_CARE_PROVIDER_SITE_OTHER): Payer: Medicare Other | Admitting: Internal Medicine

## 2014-05-17 ENCOUNTER — Other Ambulatory Visit (INDEPENDENT_AMBULATORY_CARE_PROVIDER_SITE_OTHER): Payer: Medicare Other

## 2014-05-17 VITALS — BP 140/98 | HR 80 | Temp 97.8°F | Ht 64.0 in | Wt 181.0 lb

## 2014-05-17 DIAGNOSIS — Z0189 Encounter for other specified special examinations: Secondary | ICD-10-CM

## 2014-05-17 DIAGNOSIS — F411 Generalized anxiety disorder: Secondary | ICD-10-CM

## 2014-05-17 DIAGNOSIS — R030 Elevated blood-pressure reading, without diagnosis of hypertension: Secondary | ICD-10-CM | POA: Diagnosis not present

## 2014-05-17 DIAGNOSIS — Z23 Encounter for immunization: Secondary | ICD-10-CM

## 2014-05-17 DIAGNOSIS — E119 Type 2 diabetes mellitus without complications: Secondary | ICD-10-CM

## 2014-05-17 DIAGNOSIS — E785 Hyperlipidemia, unspecified: Secondary | ICD-10-CM | POA: Diagnosis not present

## 2014-05-17 DIAGNOSIS — IMO0001 Reserved for inherently not codable concepts without codable children: Secondary | ICD-10-CM

## 2014-05-17 DIAGNOSIS — L918 Other hypertrophic disorders of the skin: Secondary | ICD-10-CM

## 2014-05-17 DIAGNOSIS — Z0000 Encounter for general adult medical examination without abnormal findings: Secondary | ICD-10-CM

## 2014-05-17 DIAGNOSIS — L989 Disorder of the skin and subcutaneous tissue, unspecified: Secondary | ICD-10-CM

## 2014-05-17 LAB — URINALYSIS, ROUTINE W REFLEX MICROSCOPIC
Bilirubin Urine: NEGATIVE
Hgb urine dipstick: NEGATIVE
Ketones, ur: NEGATIVE
Leukocytes, UA: NEGATIVE
NITRITE: NEGATIVE
PH: 5.5 (ref 5.0–8.0)
Specific Gravity, Urine: 1.015 (ref 1.000–1.030)
TOTAL PROTEIN, URINE-UPE24: NEGATIVE
URINE GLUCOSE: NEGATIVE
Urobilinogen, UA: 0.2 (ref 0.0–1.0)

## 2014-05-17 LAB — BASIC METABOLIC PANEL
BUN: 15 mg/dL (ref 6–23)
CHLORIDE: 105 meq/L (ref 96–112)
CO2: 30 mEq/L (ref 19–32)
Calcium: 9.8 mg/dL (ref 8.4–10.5)
Creatinine, Ser: 0.95 mg/dL (ref 0.40–1.20)
GFR: 74.79 mL/min (ref >60.00)
Glucose, Bld: 99 mg/dL (ref 70–99)
POTASSIUM: 4.6 meq/L (ref 3.5–5.1)
SODIUM: 139 meq/L (ref 135–145)

## 2014-05-17 LAB — LIPID PANEL
Cholesterol: 140 mg/dL (ref 0–200)
HDL: 47 mg/dL (ref >39.00)
LDL Cholesterol: 71 mg/dL (ref 0–99)
NonHDL: 93
Total CHOL/HDL Ratio: 3
Triglycerides: 109 mg/dL (ref 0.0–149.0)
VLDL: 21.8 mg/dL (ref 0.0–40.0)

## 2014-05-17 LAB — TSH: TSH: 1.34 u[IU]/mL (ref 0.35–4.50)

## 2014-05-17 LAB — HEPATIC FUNCTION PANEL
ALK PHOS: 75 U/L (ref 39–117)
ALT: 9 U/L (ref 0–35)
AST: 16 U/L (ref 0–37)
Albumin: 3.8 g/dL (ref 3.5–5.2)
BILIRUBIN DIRECT: 0 mg/dL (ref 0.0–0.3)
Total Bilirubin: 0.3 mg/dL (ref 0.2–1.2)
Total Protein: 7.4 g/dL (ref 6.0–8.3)

## 2014-05-17 LAB — CBC WITH DIFFERENTIAL/PLATELET
BASOS PCT: 0.5 % (ref 0.0–3.0)
Basophils Absolute: 0 10*3/uL (ref 0.0–0.1)
Eosinophils Absolute: 0.1 10*3/uL (ref 0.0–0.7)
Eosinophils Relative: 1.1 % (ref 0.0–5.0)
HEMATOCRIT: 39.8 % (ref 36.0–46.0)
Hemoglobin: 13.3 g/dL (ref 12.0–15.0)
LYMPHS PCT: 37.8 % (ref 12.0–46.0)
Lymphs Abs: 3.2 10*3/uL (ref 0.7–4.0)
MCHC: 33.5 g/dL (ref 30.0–36.0)
MCV: 79.3 fl (ref 78.0–100.0)
MONO ABS: 0.6 10*3/uL (ref 0.1–1.0)
Monocytes Relative: 7 % (ref 3.0–12.0)
NEUTROS PCT: 53.6 % (ref 43.0–77.0)
Neutro Abs: 4.6 10*3/uL (ref 1.4–7.7)
PLATELETS: 335 10*3/uL (ref 150.0–400.0)
RBC: 5.01 Mil/uL (ref 3.87–5.11)
RDW: 14.9 % (ref 11.5–15.5)
WBC: 8.6 10*3/uL (ref 4.0–10.5)

## 2014-05-17 LAB — MICROALBUMIN / CREATININE URINE RATIO
Creatinine,U: 91.7 mg/dL
MICROALB/CREAT RATIO: 5 mg/g (ref 0.0–30.0)
Microalb, Ur: 4.6 mg/dL — ABNORMAL HIGH (ref 0.0–1.9)

## 2014-05-17 LAB — HEMOGLOBIN A1C: HEMOGLOBIN A1C: 7 % — AB (ref 4.6–6.5)

## 2014-05-17 MED ORDER — METFORMIN HCL ER 500 MG PO TB24: ORAL_TABLET | ORAL | Status: DC

## 2014-05-17 NOTE — Assessment & Plan Note (Signed)
stable overall by history and exam, recent data reviewed with pt, and pt to continue medical treatment as before,  to f/u any worsening symptoms or concerns Lab Results  Component Value Date   WBC 6.1 09/07/2013   HGB 14.0 09/07/2013   HCT 42.0 09/07/2013   PLT 251.0 09/07/2013   GLUCOSE 89 03/06/2012   CHOL 128 03/06/2012   TRIG 164.0* 03/06/2012   HDL 42.30 03/06/2012   LDLDIRECT 99.9 03/07/2007   LDLCALC 53 03/06/2012   ALT 20 06/04/2011   AST 19 06/04/2011   NA 140 03/06/2012   K 3.8 03/06/2012   CL 101 03/06/2012   CREATININE 1.0 03/06/2012   BUN 10 03/06/2012   CO2 33* 03/06/2012   TSH 1.49 06/04/2011   HGBA1C 6.8* 09/07/2013   MICROALBUR 13.1* 06/04/2011

## 2014-05-17 NOTE — Assessment & Plan Note (Signed)
elev today, unsual for her, declines new tx, to cont monitor at home and next visit BP Readings from Last 3 Encounters:  05/17/14 140/98  03/27/14 148/90  12/31/13 138/80

## 2014-05-17 NOTE — Assessment & Plan Note (Addendum)
stable overall by history and exam, recent data reviewed with pt, and pt to continue medical treatment as before,  to f/u any worsening symptoms or concerns Lab Results  Component Value Date   LDLCALC 53 03/06/2012   For f/u lab, ECG reviewed as per emr

## 2014-05-17 NOTE — Addendum Note (Signed)
Addended by: Corwin LevinsJOHN, Makaria Poarch W on: 05/17/2014 12:49 PM   Modules accepted: Kipp BroodSmartSet

## 2014-05-17 NOTE — Progress Notes (Addendum)
Subjective:    Patient ID: Brittany Oconnor, female    DOB: [birth year], 69 y.o.   MRN: [id number]  HPI  Here for yearly f/u;  Overall doing ok;  Pt denies CP, worsening SOB, DOE, wheezing, orthopnea, PND, worsening LE edema, palpitations, dizziness or syncope.  Pt denies neurological change such as new headache, facial or extremity weakness.  Pt denies polydipsia, polyuria, or low sugar symptoms. Pt states overall good compliance with treatment and medications, good tolerability, and has been trying to follow lower cholesterol diet.  Pt denies worsening depressive symptoms, suicidal ideation or panic. No fever, night sweats, wt loss, loss of appetite, or other constitutional symptoms.  Pt states good ability with ADL's, has low fall risk, home safety reviewed and adequate, no other significant changes in hearing or vision, and only occasionally active with exercise.BP usually < 140/90 at home, rushed to get here. No new compalints. Denies worsening depressive symptoms, suicidal ideation, or panic; has ongoing anxiety, not increased recently. Also with new worsening skin tag near left temple area Past Medical History  Diagnosis Date  . ALLERGIC RHINITIS 11/24/2006  . ANXIETY 07/10/2008  . DIABETES MELLITUS, TYPE II 11/24/2006  . HYPERLIPIDEMIA 11/24/2006   No past surgical history on file.  reports that she has been smoking.  She does not have any smokeless tobacco history on file. She reports that she does not drink alcohol or use illicit drugs. family history includes Hypertension in her father. Allergies  Allergen Reactions  . Doxycycline    Current Outpatient Prescriptions on File Prior to Visit  Medication Sig Dispense Refill  . aspirin 81 MG tablet Take 81 mg by mouth daily.    . metFORMIN (GLUCOPHAGE-XR) 500 MG 24 hr tablet TAKE 3 TABLETS EVERY MORNING 270 tablet 2  . simvastatin (ZOCOR) 20 MG tablet TAKE 1 TABLET BY MOUTH EVERY EVENING. 90 tablet 2  . fluticasone (FLONASE) 50 MCG/ACT nasal  spray Place 2 sprays into both nostrils daily. (Patient taking differently: Place 2 sprays into both nostrils as needed. ) 16 g 2   No current facility-administered medications on file prior to visit.   Review of Systems  Constitutional: Negative for increased diaphoresis, other activity, appetite or other siginficant weight change  HENT: Negative for worsening hearing loss, ear pain, facial swelling, mouth sores and neck stiffness.   Eyes: Negative for other worsening pain, redness or visual disturbance.  Respiratory: Negative for shortness of breath and wheezing.   Cardiovascular: Negative for chest pain and palpitations.  Gastrointestinal: Negative for diarrhea, blood in stool, abdominal distention or other pain Genitourinary: Negative for hematuria, flank pain or change in urine volume.  Musculoskeletal: Negative for myalgias or other joint complaints.  Skin: Negative for color change and wound.  Neurological: Negative for syncope and numbness. other than noted Hematological: Negative for adenopathy. or other swelling Psychiatric/Behavioral: Negative for hallucinations, self-injury, decreased concentration or other worsening agitation.       Objective:   Physical Exam BP 140/98 mmHg  Pulse 80  Temp(Src) 97.8 F (36.6 C) (Oral)  Ht 5\' 4"  (1.626 m)  Wt 181 lb (82.101 kg)  BMI 31.05 kg/m2 VS noted,  Constitutional: Pt is oriented to person, place, and time. Appears well-developed and well-nourished.  Head: Normocephalic and atraumatic.  Right Ear: External ear normal.  Left Ear: External ear normal.  Nose: Nose normal.  Mouth/Throat: Oropharynx is clear and moist.  Eyes: Conjunctivae and EOM are normal. Pupils are equal, round, and reactive to light.  Neck:  Normal range of motion. Neck supple. No JVD present. No tracheal deviation present.  Cardiovascular: Normal rate, regular rhythm, normal heart sounds and intact distal pulses.   Pulmonary/Chest: Effort normal and breath  sounds without rales or wheezing  Abdominal: Soft. Bowel sounds are normal. NT. No HSM  Musculoskeletal: Normal range of motion. Exhibits no edema.  Lymphadenopathy:  Has no cervical adenopathy.  Neurological: Pt is alert and oriented to person, place, and time. Pt has normal reflexes. No cranial nerve deficit. Motor grossly intact Skin: Skin is warm and dry. No rash noted. Left Temple with skin tag 7 mm Psychiatric:  Has normal mood and affect. Behavior is normal. mild nervous    Assessment & Plan:

## 2014-05-17 NOTE — Assessment & Plan Note (Addendum)
stable overall by history and exam, recent data reviewed with pt, and pt to continue medical treatment as before,  to f/u any worsening symptoms or concerns Lab Results  Component Value Date   HGBA1C 6.8* 09/07/2013   For f/u lab

## 2014-05-17 NOTE — Progress Notes (Signed)
Pre visit review using our clinic review tool, if applicable. No additional management support is needed unless otherwise documented below in the visit note. 

## 2014-05-17 NOTE — Addendum Note (Signed)
Addended by: Corwin LevinsJOHN, Jaquae Rieves W on: 05/17/2014 10:04 AM   Modules accepted: Orders, SmartSet

## 2014-05-17 NOTE — Patient Instructions (Addendum)
You had the Prevnar pneumonia shot today  Please continue all other medications as before, and refills have been done if requested.  Please have the pharmacy call with any other refills you may need.  Please continue your efforts at being more active, low cholesterol diet, and weight control.  You are otherwise up to date with prevention measures today.  Please keep your appointments with your specialists as you may have planned  Please continyue to monitor your blood pressure at home, with the goal being less than 140/90  Please go to the LAB in the Basement (turn left off the elevator) for the tests to be done today  You will be contacted by phone if any changes need to be made immediately.  Otherwise, you will receive a letter about your results with an explanation, but please check with MyChart first.  \Please remember to sign up for MyChart if you have not done so, as this will be important to you in the future with finding out test results, communicating by private email, and scheduling acute appointments online when needed.  You will be contacted regarding the referral for: dermatology  Please return in 6 months, or sooner if needed

## 2014-05-17 NOTE — Addendum Note (Signed)
Addended by: Kern ReapVEREEN, Parley Pidcock B on: 05/17/2014 10:03 AM   Modules accepted: Orders

## 2014-06-04 ENCOUNTER — Other Ambulatory Visit: Payer: Self-pay | Admitting: Internal Medicine

## 2014-07-19 ENCOUNTER — Encounter: Payer: Medicare Other | Admitting: Internal Medicine

## 2014-12-26 ENCOUNTER — Other Ambulatory Visit: Payer: Self-pay

## 2014-12-26 DIAGNOSIS — Z1231 Encounter for screening mammogram for malignant neoplasm of breast: Secondary | ICD-10-CM

## 2015-01-07 ENCOUNTER — Ambulatory Visit (INDEPENDENT_AMBULATORY_CARE_PROVIDER_SITE_OTHER): Payer: Medicare Other | Admitting: Internal Medicine

## 2015-01-07 ENCOUNTER — Encounter: Payer: Self-pay | Admitting: Internal Medicine

## 2015-01-07 VITALS — BP 130/80 | HR 89 | Temp 97.8°F | Ht 64.0 in | Wt 190.4 lb

## 2015-01-07 DIAGNOSIS — J309 Allergic rhinitis, unspecified: Secondary | ICD-10-CM

## 2015-01-07 DIAGNOSIS — R1310 Dysphagia, unspecified: Secondary | ICD-10-CM

## 2015-01-07 MED ORDER — AMOXICILLIN 500 MG PO CAPS: 500.0000 mg | ORAL_CAPSULE | Freq: Three times a day (TID) | ORAL | Status: DC

## 2015-01-07 NOTE — Progress Notes (Signed)
Pre visit review using our clinic review tool, if applicable. No additional management support is needed unless otherwise documented below in the visit note. 

## 2015-01-07 NOTE — Patient Instructions (Addendum)
Plain Mucinex (NOT D) for thick secretions ;force NON dairy fluids .   Nasal cleansing in the shower as discussed with lather of mild shampoo.After 10 seconds wash off lather while  exhaling through nostrils. Make sure that all residual soap is removed to prevent irritation.  Fluticasone 1 spray in each nostril twice a day as needed. Use the "crossover" technique into opposite nostril spraying toward opposite ear @ 45 degree angle, not straight up into nostril.  Use a Neti pot daily only  as needed for significant sinus congestion; going from open side to congested side . Plain Allegra (NOT D )  160 daily , Loratidine 10 mg , OR Zyrtec 10 mg @ bedtime  as needed for itchy eyes & sneezing.  Fill the prescription for the antibiotics if he had fever or purulent secretions from the head or chest.    Reflux of gastric acid may be asymptomatic as this may occur mainly during sleep.The triggers for reflux  include stress; the "aspirin family" ; alcohol; peppermint; and caffeine (coffee, tea, cola, and chocolate). The aspirin family would include aspirin and the nonsteroidal agents such as ibuprofen &  Naproxen. Tylenol would not cause reflux. If having symptoms ; food & drink should be avoided for @ least 2 hours before going to bed.  Take the ranitidine 150 mg 30 minutes before breakfast and evening meal. GI referral recommended if the food hanging up persists or progresses.

## 2015-01-07 NOTE — Progress Notes (Signed)
   Subjective:    Patient ID: Brittany Oconnor, female    DOB: 05/18/1945, 69 y.o.   MRN: [id number]  HPI Her symptoms began Sunday 12/29/14 after working outside. She experienced postnasal drainage and white phlegm. She's had intermittent nasal congestion. She also has associated fatigue. The cough is worse at night. 2 days ago she had pain in the left ear but this resolved.   She does have seasonal pattern of this sort in October and February. She continues to smoke half a pack per day but expresses interest in stopping smoking.  She denies other symptoms of upper respiratory tract infection or extrinsic symptoms.   Review of Systems Frontal headache, facial pain , nasal purulence,  sore throat , otic pain or otic discharge denied. No fever , chills or sweats.  She also describes food intermittently getting stuck in her throat over the last several months. She feels as if she must clear her throat.      Objective:   Physical Exam  She has complete dentures. Nasal erythema and edema are present with complete occlusion of the right nasal passageway.  General appearance:Adequately nourished; no acute distress or increased work of breathing is present.    Lymphatic: No  lymphadenopathy about the head, neck, or axilla .  Eyes: No conjunctival inflammation or lid edema is present. There is no scleral icterus.  Ears:  External ear exam shows no significant lesions or deformities.  Otoscopic examination reveals clear canals, tympanic membranes are intact bilaterally without bulging, retraction, inflammation or discharge.  Nose:  External nasal examination shows no deformity or inflammation.   Oral exam:  lips and gums are healthy appearing.There is no oropharyngeal erythema or exudate .  Neck:  No deformities, thyromegaly, masses, or tenderness noted.   Supple with full range of motion without pain.   Heart:  Normal rate and regular rhythm. S1 and S2 normal without gallop, murmur, click,  rub or other extra sounds.   Lungs:Chest clear to auscultation; no wheezes, rhonchi,rales ,or rubs present.  Extremities:  No cyanosis, edema, or clubbing  noted    Skin: Warm & dry w/o tenting. No significant lesions or rash.      Assessment & Plan:  #1 extrinsic rhinitis without suggestion of sinusitis  #2 possible intermittent dysphagia  #3 smoker; she does want to quit. Risk discussed.  Plan: See orders and recommendation

## 2015-01-15 ENCOUNTER — Ambulatory Visit: Payer: Federal, State, Local not specified - PPO

## 2015-02-17 ENCOUNTER — Ambulatory Visit
Admission: RE | Admit: 2015-02-17 | Discharge: 2015-02-17 | Disposition: A | Discharge status: Home / Self Care | Payer: Medicare Other | Source: Ambulatory Visit

## 2015-02-17 DIAGNOSIS — Z1231 Encounter for screening mammogram for malignant neoplasm of breast: Secondary | ICD-10-CM

## 2015-02-24 ENCOUNTER — Other Ambulatory Visit: Payer: Self-pay | Admitting: Internal Medicine

## 2015-02-24 DIAGNOSIS — R928 Other abnormal and inconclusive findings on diagnostic imaging of breast: Secondary | ICD-10-CM

## 2015-03-04 ENCOUNTER — Ambulatory Visit
Admission: RE | Admit: 2015-03-04 | Discharge: 2015-03-04 | Disposition: A | Discharge status: Home / Self Care | Payer: Medicare Other | Source: Ambulatory Visit | Attending: Internal Medicine | Admitting: Internal Medicine

## 2015-03-04 ENCOUNTER — Other Ambulatory Visit: Payer: Self-pay | Admitting: Internal Medicine

## 2015-03-04 DIAGNOSIS — R928 Other abnormal and inconclusive findings on diagnostic imaging of breast: Secondary | ICD-10-CM

## 2015-03-09 ENCOUNTER — Other Ambulatory Visit: Payer: Self-pay | Admitting: Internal Medicine

## 2015-03-11 ENCOUNTER — Ambulatory Visit
Admission: RE | Admit: 2015-03-11 | Discharge: 2015-03-11 | Disposition: A | Discharge status: Home / Self Care | Payer: Federal, State, Local not specified - PPO | Source: Ambulatory Visit | Attending: Internal Medicine | Admitting: Internal Medicine

## 2015-03-11 ENCOUNTER — Other Ambulatory Visit: Payer: Self-pay | Admitting: Internal Medicine

## 2015-03-11 ENCOUNTER — Ambulatory Visit
Admission: RE | Admit: 2015-03-11 | Discharge: 2015-03-11 | Disposition: A | Discharge status: Home / Self Care | Payer: Medicare Other | Source: Ambulatory Visit | Attending: Internal Medicine | Admitting: Internal Medicine

## 2015-03-11 DIAGNOSIS — R928 Other abnormal and inconclusive findings on diagnostic imaging of breast: Secondary | ICD-10-CM

## 2015-03-11 HISTORY — PX: BREAST BIOPSY: SHX20

## 2015-05-10 ENCOUNTER — Encounter: Payer: Self-pay | Admitting: Internal Medicine

## 2015-05-10 ENCOUNTER — Ambulatory Visit (INDEPENDENT_AMBULATORY_CARE_PROVIDER_SITE_OTHER): Payer: Medicare Other | Admitting: Internal Medicine

## 2015-05-10 VITALS — BP 134/72 | HR 112 | Temp 98.8°F | Wt 192.0 lb

## 2015-05-10 DIAGNOSIS — J441 Chronic obstructive pulmonary disease with (acute) exacerbation: Secondary | ICD-10-CM

## 2015-05-10 DIAGNOSIS — R05 Cough: Secondary | ICD-10-CM

## 2015-05-10 DIAGNOSIS — Z0189 Encounter for other specified special examinations: Secondary | ICD-10-CM | POA: Diagnosis not present

## 2015-05-10 DIAGNOSIS — R079 Chest pain, unspecified: Secondary | ICD-10-CM | POA: Insufficient documentation

## 2015-05-10 DIAGNOSIS — Z0000 Encounter for general adult medical examination without abnormal findings: Secondary | ICD-10-CM

## 2015-05-10 DIAGNOSIS — R059 Cough, unspecified: Secondary | ICD-10-CM

## 2015-05-10 MED ORDER — HYDROCODONE-HOMATROPINE 5-1.5 MG/5ML PO SYRP: 5.0000 mL | ORAL_SOLUTION | Freq: Four times a day (QID) | ORAL | Status: DC | PRN

## 2015-05-10 MED ORDER — PREDNISONE 10 MG PO TABS: ORAL_TABLET | ORAL | Status: DC

## 2015-05-10 MED ORDER — LEVOFLOXACIN 250 MG PO TABS
250.0000 mg | ORAL_TABLET | Freq: Every day | ORAL | Status: DC
Start: 2015-05-10 — End: 2015-05-28

## 2015-05-10 NOTE — Assessment & Plan Note (Signed)
Mild, very low suspicion for cardiac, likely msk or pleurisy, for tylenol prn,,  to f/u any worsening symptoms or concerns

## 2015-05-10 NOTE — Progress Notes (Signed)
Pre visit review using our clinic review tool, if applicable. No additional management support is needed unless otherwise documented below in the visit note. 

## 2015-05-10 NOTE — Patient Instructions (Addendum)
Please take all new medication as prescribed - the antibiotic, cough medicine, and prednisone  You can also take Mucinex (or it's generic off brand) for congestion, and tylenol as needed for pain.   Please continue all other medications as before, and refills have been done if requested.  Please have the pharmacy call with any other refills you may need.  Please keep your appointments with your specialists as you may have planned  You will be contacted regarding the referral for: colonoscopy

## 2015-05-10 NOTE — Progress Notes (Signed)
   Subjective:    Patient ID: Brittany Oconnor, female    DOB: 1945-06-19, 70 y.o.   MRN: [id number]  HPI   Here with acute onset mild to mod 2-3 days ST, HA, general weakness and malaise, with prod cough greenish sputum, but Pt denies chest pain, increased sob or doe, wheezing, orthopnea, PND, increased LE swelling, palpitations, dizziness or syncope, except for onset mild wheezing/sob x 2 days, as well a intermittent fleeting sharp pleuritic right lateral chest pains last night.  Pt denies new neurological symptoms such as new headache, or facial or extremity weakness or numbness   Pt denies polydipsia, polyuria.  Also missed colonoscopy last yr due to having to cancel due to family emergency. Past Medical History  Diagnosis Date  . ALLERGIC RHINITIS 11/24/2006  . ANXIETY 07/10/2008  . DIABETES MELLITUS, TYPE II 11/24/2006  . HYPERLIPIDEMIA 11/24/2006   No past surgical history on file.  reports that she has been smoking Cigarettes.  She has been smoking about 0.50 packs per day. She does not have any smokeless tobacco history on file. She reports that she does not drink alcohol or use illicit drugs. family history includes Hypertension in her father. Allergies  Allergen Reactions  . Doxycycline    Current Outpatient Prescriptions on File Prior to Visit  Medication Sig Dispense Refill  . aspirin 81 MG tablet Take 81 mg by mouth daily.    . metFORMIN (GLUCOPHAGE-XR) 500 MG 24 hr tablet TAKE 3 TABLETS EVERY MORNING 270 tablet 0  . simvastatin (ZOCOR) 20 MG tablet TAKE 1 TABLET BY MOUTH EVERY EVENING. 90 tablet 2   No current facility-administered medications on file prior to visit.    Review of Systems  Constitutional: Negative for unusual diaphoresis or night sweats HENT: Negative for ringing in ear or discharge Eyes: Negative for double vision or worsening visual disturbance.  Respiratory: Negative for choking and stridor.   Gastrointestinal: Negative for vomiting or other signifcant  bowel change Genitourinary: Negative for hematuria or change in urine volume.  Musculoskeletal: Negative for other MSK pain or swelling Skin: Negative for color change and worsening wound.  Neurological: Negative for tremors and numbness other than noted  Psychiatric/Behavioral: Negative for decreased concentration or agitation other than above       Objective:   Physical Exam BP 134/72 mmHg  Pulse 112  Temp(Src) 98.8 F (37.1 C) (Oral)  Wt 192 lb (87.091 kg)  SpO2 96% VS noted, mild ill Constitutional: Pt appears in no significant distress HENT: Head: NCAT.  Right Ear: External ear normal.  Left Ear: External ear normal.  Bilat tm's with mild erythema.  Max sinus areas non tender.  Pharynx with mild erythema, no exudate Eyes: . Pupils are equal, round, and reactive to light. Conjunctivae and EOM are normal Neck: Normal range of motion. Neck supple.  Cardiovascular: Normal rate and regular rhythm.   Pulmonary/Chest: Effort normal and breath sounds decreased without rales but few scattered wheezing.  Neurological: Pt is alert. Not confused , motor grossly intact Skin: Skin is warm. No rash, no LE edema Psychiatric: Pt behavior is normal. No agitation.     Assessment & Plan:

## 2015-05-10 NOTE — Assessment & Plan Note (Signed)
Mild to mod,  C/w bronchitis vs pna, declines cxr, for antibx course, cough med prn,  to f/u any worsening symptoms or concerns

## 2015-05-10 NOTE — Assessment & Plan Note (Signed)
Mild to mod,  for predpac asd, declines inhaler,,  to f/u any worsening symptoms or concerns

## 2015-05-28 ENCOUNTER — Ambulatory Visit (AMBULATORY_SURGERY_CENTER): Payer: Self-pay | Admitting: *Deleted

## 2015-05-28 VITALS — Ht 63.0 in | Wt 196.6 lb

## 2015-05-28 DIAGNOSIS — Z800 Family history of malignant neoplasm of digestive organs: Secondary | ICD-10-CM

## 2015-05-28 MED ORDER — NA SULFATE-K SULFATE-MG SULF 17.5-3.13-1.6 GM/177ML PO SOLN: 1.0000 | Freq: Once | ORAL | Status: DC

## 2015-05-28 NOTE — Progress Notes (Signed)
No egg or soy allergy known to patient  No issues with past sedation with any surgeries  or procedures, no intubation problems  No diet pills per patient No home 02 use per patient  No blood thinners per patient    

## 2015-06-05 ENCOUNTER — Other Ambulatory Visit: Payer: Self-pay | Admitting: Internal Medicine

## 2015-06-17 ENCOUNTER — Encounter: Payer: Self-pay | Admitting: Internal Medicine

## 2015-06-17 ENCOUNTER — Ambulatory Visit (AMBULATORY_SURGERY_CENTER): Payer: Medicare Other | Admitting: Internal Medicine

## 2015-06-17 VITALS — BP 167/89 | HR 84 | Temp 98.0°F | Resp 20

## 2015-06-17 DIAGNOSIS — Z1211 Encounter for screening for malignant neoplasm of colon: Secondary | ICD-10-CM | POA: Diagnosis not present

## 2015-06-17 DIAGNOSIS — Z800 Family history of malignant neoplasm of digestive organs: Secondary | ICD-10-CM

## 2015-06-17 MED ORDER — SODIUM CHLORIDE 0.9 % IV SOLN: 500.0000 mL | INTRAVENOUS | Status: DC

## 2015-06-17 NOTE — Progress Notes (Signed)
Report to PACU, RN, vss, BBS= Clear.  

## 2015-06-17 NOTE — Op Note (Signed)
[facility] Patient Name: Brittany Oconnor Procedure Date: 06/17/2015 11:09 AM MRN: [id number] Endoscopist: Beverley Fiedler , MD Age: 70 Referring MD:  Date of Birth: Jan 27, 1946 Gender: Female Procedure:                Colonoscopy Indications:              Colon cancer screening in patient at increased                            risk: Colorectal cancer in 2 sisters, This is the                            patient's first colonoscopy Medicines:                Monitored Anesthesia Care Procedure:                Pre-Anesthesia Assessment:                           - Prior to the procedure, a History and Physical                            was performed, and patient medications and                            allergies were reviewed. The patient's tolerance of                            previous anesthesia was also reviewed. The risks                            and benefits of the procedure and the sedation                            options and risks were discussed with the patient.                            All questions were answered, and informed consent                            was obtained. Prior Anticoagulants: The patient has                            taken no previous anticoagulant or antiplatelet                            agents. ASA Grade Assessment: II - A patient with                            mild systemic disease. After reviewing the risks                            and benefits, the patient was deemed in  satisfactory condition to undergo the procedure.                           After obtaining informed consent, the colonoscope                            was passed under direct vision. Throughout the                            procedure, the patient's blood pressure, pulse, and                            oxygen saturations were monitored continuously. The                            Model PCF-H190L [id number]) scope was introduced                     through the anus and advanced to the the cecum,                            identified by appendiceal orifice and ileocecal                            valve. The colonoscopy was performed without                            difficulty. The patient tolerated the procedure                            well. The quality of the bowel preparation was                            good. The ileocecal valve, appendiceal orifice, and                            rectum were photographed. The bowel preparation                            used was SUPREP. Scope In: 11:15:35 AM Scope Out: 11:27:01 AM Scope Withdrawal Time: 0 hours 9 minutes 20 seconds  Total Procedure Duration: 0 hours 11 minutes 26 seconds  Findings:      The digital rectal exam was normal.      Multiple small and large-mouthed diverticula were found in the left       colon.      The exam was otherwise without abnormality on direct and retroflexion       views. Complications:            No immediate complications. Estimated Blood Loss:     Estimated blood loss: none. Impression:               - Moderate diverticulosis in the left colon.                           - The examination was otherwise normal on direct  and retroflexion views.                           - No specimens collected. Recommendation:           - Patient has a contact number available for                            emergencies. The signs and symptoms of potential                            delayed complications were discussed with the                            patient. Return to normal activities tomorrow.                            Written discharge instructions were provided to the                            patient.                           - Resume previous diet.                           - Continue present medications.                           - Repeat colonoscopy in 5 years for screening                             purposes given family history of colon cancer. Procedure Code(s):        --- Professional ---                           Z6109, Colorectal cancer screening; colonoscopy on                            individual at high risk CPT copyright 2016 [employer]. All rights reserved. Carie Caddy. Rhea Belton, MD Beverley Fiedler, MD 06/17/2015 11:31:00 AM This report has been signed electronically. Number of Addenda: 0 Referring MD:      Corwin Levins

## 2015-06-17 NOTE — Patient Instructions (Signed)
YOU HAD AN ENDOSCOPIC PROCEDURE TODAY AT THE [facility]:   Refer to the procedure report that was given to you for any specific questions about what was found during the examination.  If the procedure report does not answer your questions, please call your gastroenterologist to clarify.  If you requested that your care partner not be given the details of your procedure findings, then the procedure report has been included in a sealed envelope for you to review at your convenience later.  YOU SHOULD EXPECT: Some feelings of bloating in the abdomen. Passage of more gas than usual.  Walking can help get rid of the air that was put into your GI tract during the procedure and reduce the bloating. If you had a lower endoscopy (such as a colonoscopy or flexible sigmoidoscopy) you may notice spotting of blood in your stool or on the toilet paper. If you underwent a bowel prep for your procedure, you may not have a normal bowel movement for a few days.  Please Note:  You might notice some irritation and congestion in your nose or some drainage.  This is from the oxygen used during your procedure.  There is no need for concern and it should clear up in a day or so.  SYMPTOMS TO REPORT IMMEDIATELY:   Following lower endoscopy (colonoscopy or flexible sigmoidoscopy):  Excessive amounts of blood in the stool  Significant tenderness or worsening of abdominal pains  Swelling of the abdomen that is new, acute  Fever of 100F or higher   For urgent or emergent issues, a gastroenterologist can be reached at any hour by calling [phone].   DIET: Your first meal following the procedure should be a small meal and then it is ok to progress to your normal diet. Heavy or fried foods are harder to digest and may make you feel nauseous or bloated.  Likewise, meals heavy in dairy and vegetables can increase bloating.  Drink plenty of fluids but you should avoid alcoholic beverages for 24 hours.  Try to  increase the fiber in your diet.  Drink plenty of fluids.  ACTIVITY:  You should plan to take it easy for the rest of today and you should NOT DRIVE or use heavy machinery until tomorrow (because of the sedation medicines used during the test).    FOLLOW UP: Our staff will call the number listed on your records the next business day following your procedure to check on you and address any questions or concerns that you may have regarding the information given to you following your procedure. If we do not reach you, we will leave a message.  However, if you are feeling well and you are not experiencing any problems, there is no need to return our call.  We will assume that you have returned to your regular daily activities without incident.  If any biopsies were taken you will be contacted by phone or by letter within the next 1-3 weeks.  Please call us at [phone] if you have not heard about the biopsies in 3 weeks.    SIGNATURES/CONFIDENTIALITY: You and/or your care partner have signed paperwork which will be entered into your electronic medical record.  These signatures attest to the fact that that the information above on your After Visit Summary has been reviewed and is understood.  Full responsibility of the confidentiality of this discharge information lies with you and/or your care-partner.  Read all of the handouts given to you by your  room nurse.  Thank-you for choosing us for your healthcare needs today. 

## 2015-06-18 ENCOUNTER — Telehealth: Payer: Self-pay | Admitting: Emergency Medicine

## 2015-06-18 NOTE — Telephone Encounter (Signed)
  Follow up Call-  Call back number 06/17/2015  Post procedure Call Back phone  # [phone]  Permission to leave phone message Yes     Patient questions:  Do you have a fever, pain , or abdominal swelling? No. Pain Score  0 *  Have you tolerated food without any problems? Yes.    Have you been able to return to your normal activities? Yes.    Do you have any questions about your discharge instructions: Diet   No. Medications  No. Follow up visit  No.  Do you have questions or concerns about your Care? No.  Actions: * If pain score is 4 or above: No action needed, pain <4.

## 2015-07-14 ENCOUNTER — Other Ambulatory Visit: Payer: Self-pay | Admitting: Internal Medicine

## 2015-09-07 ENCOUNTER — Other Ambulatory Visit: Payer: Self-pay | Admitting: Internal Medicine

## 2016-01-05 ENCOUNTER — Other Ambulatory Visit: Payer: Self-pay | Admitting: Internal Medicine

## 2016-04-29 ENCOUNTER — Other Ambulatory Visit: Payer: Self-pay | Admitting: Internal Medicine

## 2016-07-21 ENCOUNTER — Other Ambulatory Visit: Payer: Self-pay | Admitting: Internal Medicine

## 2016-07-21 DIAGNOSIS — Z1231 Encounter for screening mammogram for malignant neoplasm of breast: Secondary | ICD-10-CM

## 2016-08-13 ENCOUNTER — Ambulatory Visit
Admission: RE | Admit: 2016-08-13 | Discharge: 2016-08-13 | Disposition: A | Discharge status: Home / Self Care | Payer: Medicare Other | Source: Ambulatory Visit | Attending: Internal Medicine | Admitting: Internal Medicine

## 2016-08-13 DIAGNOSIS — Z1231 Encounter for screening mammogram for malignant neoplasm of breast: Secondary | ICD-10-CM

## 2016-08-24 ENCOUNTER — Ambulatory Visit (INDEPENDENT_AMBULATORY_CARE_PROVIDER_SITE_OTHER): Payer: Medicare Other | Admitting: Internal Medicine

## 2016-08-24 ENCOUNTER — Encounter: Payer: Self-pay | Admitting: Internal Medicine

## 2016-08-24 ENCOUNTER — Other Ambulatory Visit (INDEPENDENT_AMBULATORY_CARE_PROVIDER_SITE_OTHER): Payer: Medicare Other

## 2016-08-24 ENCOUNTER — Ambulatory Visit (INDEPENDENT_AMBULATORY_CARE_PROVIDER_SITE_OTHER)
Admission: RE | Admit: 2016-08-24 | Discharge: 2016-08-24 | Disposition: A | Discharge status: Home / Self Care | Payer: Medicare Other | Source: Ambulatory Visit | Attending: Internal Medicine | Admitting: Internal Medicine

## 2016-08-24 VITALS — BP 144/86 | HR 100 | Ht 64.0 in | Wt 198.0 lb

## 2016-08-24 DIAGNOSIS — Z122 Encounter for screening for malignant neoplasm of respiratory organs: Secondary | ICD-10-CM | POA: Diagnosis not present

## 2016-08-24 DIAGNOSIS — Z0000 Encounter for general adult medical examination without abnormal findings: Secondary | ICD-10-CM

## 2016-08-24 DIAGNOSIS — E2839 Other primary ovarian failure: Secondary | ICD-10-CM

## 2016-08-24 DIAGNOSIS — E119 Type 2 diabetes mellitus without complications: Secondary | ICD-10-CM

## 2016-08-24 LAB — URINALYSIS, ROUTINE W REFLEX MICROSCOPIC
Bilirubin Urine: NEGATIVE
Hgb urine dipstick: NEGATIVE
KETONES UR: NEGATIVE
Leukocytes, UA: NEGATIVE
NITRITE: NEGATIVE
SPECIFIC GRAVITY, URINE: 1.02 (ref 1.000–1.030)
Total Protein, Urine: NEGATIVE
Urine Glucose: NEGATIVE
Urobilinogen, UA: 0.2 (ref 0.0–1.0)
pH: 5.5 (ref 5.0–8.0)

## 2016-08-24 LAB — CBC WITH DIFFERENTIAL/PLATELET
BASOS PCT: 0.5 % (ref 0.0–3.0)
Basophils Absolute: 0 10*3/uL (ref 0.0–0.1)
EOS PCT: 0.3 % (ref 0.0–5.0)
Eosinophils Absolute: 0 10*3/uL (ref 0.0–0.7)
HEMATOCRIT: 42.9 % (ref 36.0–46.0)
HEMOGLOBIN: 14.4 g/dL (ref 12.0–15.0)
LYMPHS PCT: 36.6 % (ref 12.0–46.0)
Lymphs Abs: 2.8 10*3/uL (ref 0.7–4.0)
MCHC: 33.5 g/dL (ref 30.0–36.0)
MCV: 82.9 fl (ref 78.0–100.0)
Monocytes Absolute: 0.5 10*3/uL (ref 0.1–1.0)
Monocytes Relative: 6.6 % (ref 3.0–12.0)
Neutro Abs: 4.3 10*3/uL (ref 1.4–7.7)
Neutrophils Relative %: 56 % (ref 43.0–77.0)
Platelets: 250 10*3/uL (ref 150.0–400.0)
RBC: 5.18 Mil/uL — ABNORMAL HIGH (ref 3.87–5.11)
RDW: 14.4 % (ref 11.5–15.5)
WBC: 7.6 10*3/uL (ref 4.0–10.5)

## 2016-08-24 LAB — HEPATIC FUNCTION PANEL
ALT: 10 U/L (ref 0–35)
AST: 13 U/L (ref 0–37)
Albumin: 4.3 g/dL (ref 3.5–5.2)
Alkaline Phosphatase: 58 U/L (ref 39–117)
BILIRUBIN TOTAL: 0.4 mg/dL (ref 0.2–1.2)
Bilirubin, Direct: 0.1 mg/dL (ref 0.0–0.3)
Total Protein: 7.5 g/dL (ref 6.0–8.3)

## 2016-08-24 LAB — MICROALBUMIN / CREATININE URINE RATIO
CREATININE, U: 132.3 mg/dL
Microalb Creat Ratio: 5.3 mg/g (ref 0.0–30.0)
Microalb, Ur: 7 mg/dL — ABNORMAL HIGH (ref 0.0–1.9)

## 2016-08-24 LAB — BASIC METABOLIC PANEL
BUN: 13 mg/dL (ref 6–23)
CO2: 29 mEq/L (ref 19–32)
CREATININE: 1.12 mg/dL (ref 0.40–1.20)
Calcium: 9.8 mg/dL (ref 8.4–10.5)
Chloride: 105 mEq/L (ref 96–112)
GFR: 61.45 mL/min (ref >60.00)
Glucose, Bld: 123 mg/dL — ABNORMAL HIGH (ref 70–99)
POTASSIUM: 4.7 meq/L (ref 3.5–5.1)
Sodium: 140 mEq/L (ref 135–145)

## 2016-08-24 LAB — TSH: TSH: 1.11 u[IU]/mL (ref 0.35–4.50)

## 2016-08-24 LAB — LIPID PANEL
CHOL/HDL RATIO: 3
Cholesterol: 124 mg/dL (ref 0–200)
HDL: 46.5 mg/dL (ref >39.00)
LDL CALC: 54 mg/dL (ref 0–99)
NONHDL: 77.45
TRIGLYCERIDES: 116 mg/dL (ref 0.0–149.0)
VLDL: 23.2 mg/dL (ref 0.0–40.0)

## 2016-08-24 LAB — HEMOGLOBIN A1C: Hgb A1c MFr Bld: 7.1 % — ABNORMAL HIGH (ref 4.6–6.5)

## 2016-08-24 MED ORDER — SIMVASTATIN 20 MG PO TABS: 20.0000 mg | ORAL_TABLET | Freq: Every evening | ORAL | 3 refills | Status: DC

## 2016-08-24 MED ORDER — METFORMIN HCL ER 500 MG PO TB24: ORAL_TABLET | ORAL | 3 refills | Status: DC

## 2016-08-24 NOTE — Assessment & Plan Note (Signed)
stable overall by history and exam, recent data reviewed with pt, and pt to continue medical treatment as before,  to f/u any worsening symptoms or concerns. Lab Results  Component Value Date   HGBA1C 7.0 (H) 05/17/2014

## 2016-08-24 NOTE — Assessment & Plan Note (Signed)

## 2016-08-24 NOTE — Patient Instructions (Addendum)
Please remember to call for your yearly eye exam.   Please schedule the bone density test before leaving today at the scheduling desk (where you check out)0.  You will be contacted regarding the referral for: Pulmonary for the Lung cancer screening CT program  Please stop smokng  Please continue all other medications as before, and refills have been done if requested.  Please have the pharmacy call with any other refills you may need.  Please continue your efforts at being more active, low cholesterol diet, and weight control.  You are otherwise up to date with prevention measures today.  Please keep your appointments with your specialists as you may have planned  Please go to the LAB in the Basement (turn left off the elevator) for the tests to be done today  You will be contacted by phone if any changes need to be made immediately.  Otherwise, you will receive a letter about your results with an explanation, but please check with MyChart first.  Please remember to sign up for MyChart if you have not done so, as this will be important to you in the future with finding out test results, communicating by private email, and scheduling acute appointments online when needed.  If you have Medicare related insurance (such as traditional Medicare, [employer] or [employer] Medicare, or similar), Please make an appointment at the [facility] with Noreene LarssonJill, the [facility], for your Wellness Visit in this office, which is a benefit with your insurance.  Please return in 6 months, or sooner if needed, with Lab testing done 3-5 days before

## 2016-08-24 NOTE — Progress Notes (Signed)
Subjective:    Patient ID: Brittany Oconnor, female    DOB: 06/08/1945, 71 y.o.   MRN: [id number]  HPI  Here for wellness and f/u;  Overall doing ok;  Pt denies Chest pain, worsening SOB, DOE, wheezing, orthopnea, PND, worsening LE edema, palpitations, dizziness or syncope.  Pt denies neurological change such as new headache, facial or extremity weakness.  Pt denies polydipsia, polyuria, or low sugar symptoms. Pt states overall good compliance with treatment and medications, good tolerability, and has been trying to follow appropriate diet.  Pt denies worsening depressive symptoms, suicidal ideation or panic. No fever, night sweats, wt loss, loss of appetite, or other constitutional symptoms.  Pt states good ability with ADL's, has low fall risk, home safety reviewed and adequate, no other significant changes in hearing or vision, and only occasionally active with exercise.  Wt in fact up 6 lbs since last visit.. Due for eye exam after her prior optho moved to [place].  BP at home < 140/90 and checks farily often.  Still smoking and would like referral to pulm for getting. LDCT  Does also have some burning tingling to the left thumb off and on for several months.  Does not want chatix at this time BP Readings from Last 3 Encounters:  08/24/16 (!) 144/86  06/17/15 (!) 167/89  05/10/15 134/72   Wt Readings from Last 3 Encounters:  08/24/16 198 lb (89.8 kg)  05/28/15 196 lb 9.6 oz (89.2 kg)  05/10/15 192 lb (87.1 kg)   Past Medical History:  Diagnosis Date  . ALLERGIC RHINITIS 11/24/2006  . Allergy   . ANXIETY 07/10/2008  . DIABETES MELLITUS, TYPE II 11/24/2006  . GERD (gastroesophageal reflux disease)   . History of shingles   . HYPERLIPIDEMIA 11/24/2006   Past Surgical History:  Procedure Laterality Date  . BREAST BIOPSY Right    benign  . MOUTH SURGERY      reports that she has been smoking Cigarettes.  She has been smoking about 0.50 packs per day. She has never used smokeless tobacco.  She reports that she does not drink alcohol or use drugs. family history includes Colon cancer (age of onset: 67) in her sister; Colon cancer (age of onset: 59) in her sister; Heart disease in her sister; Hypertension in her father. Allergies  Allergen Reactions  . Doxycycline    Current Outpatient Prescriptions on File Prior to Visit  Medication Sig Dispense Refill  . aspirin 81 MG tablet Take 81 mg by mouth daily.    . metFORMIN (GLUCOPHAGE-XR) 500 MG 24 hr tablet TAKE 3 TABLETS EVERY MORNING 270 tablet 1  . simvastatin (ZOCOR) 20 MG tablet TAKE 1 TABLET BY MOUTH EVERY EVENING. 90 tablet 2   No current facility-administered medications on file prior to visit.    Review of Systems Constitutional: Negative for other unusual diaphoresis, sweats, appetite or weight changes HENT: Negative for other worsening hearing loss, ear pain, facial swelling, mouth sores or neck stiffness.   Eyes: Negative for other worsening pain, redness or other visual disturbance.  Respiratory: Negative for other stridor or swelling Cardiovascular: Negative for other palpitations or other chest pain  Gastrointestinal: Negative for worsening diarrhea or loose stools, blood in stool, distention or other pain Genitourinary: Negative for hematuria, flank pain or other change in urine volume.  Musculoskeletal: Negative for myalgias or other joint swelling.  Skin: Negative for other color change, or other wound or worsening drainage.  Neurological: Negative for other syncope or numbness. Hematological:  Negative for other adenopathy or swelling Psychiatric/Behavioral: Negative for hallucinations, other worsening agitation, SI, self-injury, or new decreased concentration All other system neg per pt    Objective:   Physical Exam BP (!) 144/86   Pulse 100   Ht 5\' 4"  (1.626 m)   Wt 198 lb (89.8 kg)   SpO2 99%   BMI 33.99 kg/m  VS noted, not ill appearing. Constitutional: Pt is oriented to person, place, and time.  Appears well-developed and well-nourished, in no significant distress and comfortable Head: Normocephalic and atraumatic  Eyes: Conjunctivae and EOM are normal. Pupils are equal, round, and reactive to light Right Ear: External ear normal without discharge Left Ear: External ear normal without discharge Nose: Nose without discharge or deformity Mouth/Throat: Oropharynx is without other ulcerations and moist  Neck: Normal range of motion. Neck supple. No JVD present. No tracheal deviation present or significant neck LA or mass Cardiovascular: Normal rate, regular rhythm, normal heart sounds and intact distal pulses.   Pulmonary/Chest: WOB normal and breath sounds decrasedwithout rales or wheezing  Abdominal: Soft. Bowel sounds are normal. NT. No HSM  Musculoskeletal: Normal range of motion. Exhibits no edema Lymphadenopathy: Has no other cervical adenopathy.  Neurological: Pt is alert and oriented to person, place, and time. Pt has normal reflexes. No cranial nerve deficit. Motor grossly intact, Gait intact Skin: Skin is warm and dry. No rash noted or new ulcerations Psychiatric:  Has nervous mood and affect. Behavior is normal without agitation No other exam findings    Assessment & Plan:

## 2016-08-31 ENCOUNTER — Other Ambulatory Visit: Payer: Self-pay | Admitting: Internal Medicine

## 2016-08-31 MED ORDER — ALENDRONATE SODIUM 70 MG PO TABS: 70.0000 mg | ORAL_TABLET | ORAL | 3 refills | Status: DC

## 2016-09-01 ENCOUNTER — Telehealth: Payer: Self-pay

## 2016-09-01 NOTE — Telephone Encounter (Signed)
-----   Message from Corwin LevinsJames W John, MD sent at 08/31/2016  9:09 PM EDT ----- Left message on MyChart, pt to cont same tx except  The test results show that your current treatment is OK, except there is mild to mod bone loss consistent with osteopenia found on the Bone Density.  You do have enough bone loss, so that we should start a medication called fosamax to help slow this down.  You should be called from the office about this as well, and a prescription will be sent.    Rayhan Groleau to please inform pt, I will do rx

## 2016-09-01 NOTE — Telephone Encounter (Signed)
Pt has been informed and expressed understanding.  

## 2016-09-03 ENCOUNTER — Telehealth: Payer: Self-pay

## 2016-09-03 DIAGNOSIS — Z129 Encounter for screening for malignant neoplasm, site unspecified: Secondary | ICD-10-CM

## 2016-09-03 NOTE — Addendum Note (Signed)
Addended by: Roney MansGAY, SHIRRON on: 09/03/2016 01:25 PM   Modules accepted: Orders

## 2016-09-03 NOTE — Telephone Encounter (Signed)
Referrral needed to be put in per Aura CampsLori  JJ is off today

## 2016-09-06 ENCOUNTER — Other Ambulatory Visit: Payer: Self-pay | Admitting: Internal Medicine

## 2016-09-06 DIAGNOSIS — F17200 Nicotine dependence, unspecified, uncomplicated: Secondary | ICD-10-CM

## 2016-09-07 ENCOUNTER — Other Ambulatory Visit: Payer: Self-pay | Admitting: Acute Care

## 2016-09-07 DIAGNOSIS — F17210 Nicotine dependence, cigarettes, uncomplicated: Principal | ICD-10-CM

## 2016-09-15 ENCOUNTER — Encounter: Payer: Self-pay | Admitting: Acute Care

## 2016-09-15 ENCOUNTER — Encounter: Payer: Medicare Other | Admitting: Acute Care

## 2016-09-15 ENCOUNTER — Other Ambulatory Visit: Payer: Self-pay | Admitting: Acute Care

## 2016-09-15 ENCOUNTER — Ambulatory Visit (INDEPENDENT_AMBULATORY_CARE_PROVIDER_SITE_OTHER)
Admission: RE | Admit: 2016-09-15 | Discharge: 2016-09-15 | Disposition: A | Discharge status: Home / Self Care | Payer: Medicare Other | Source: Ambulatory Visit | Attending: Acute Care | Admitting: Acute Care

## 2016-09-15 ENCOUNTER — Ambulatory Visit (INDEPENDENT_AMBULATORY_CARE_PROVIDER_SITE_OTHER): Payer: Medicare Other | Admitting: Acute Care

## 2016-09-15 DIAGNOSIS — F17210 Nicotine dependence, cigarettes, uncomplicated: Principal | ICD-10-CM

## 2016-09-15 DIAGNOSIS — Z87891 Personal history of nicotine dependence: Secondary | ICD-10-CM

## 2016-09-15 NOTE — Progress Notes (Signed)
Shared Decision Making Visit Lung Cancer Screening Program [id number])   Eligibility:  Age 71 y.o.  Pack Years Smoking History Calculation 47-pack-year smoking history (# packs/per year x # years smoked)  Recent History of coughing up blood  no  Unexplained weight loss? no ( >Than 15 pounds within the last 6 months )  Prior History Lung / other cancer no (Diagnosis within the last 5 years already requiring surveillance chest CT Scans).  Smoking Status Current Smoker  Former Smokers: Years since quit: Not applicable  Quit Date: Not applicable  Visit Components:  Discussion included one or more decision making aids. yes  Discussion included risk/benefits of screening. yes  Discussion included potential follow up diagnostic testing for abnormal scans. yes  Discussion included meaning and risk of over diagnosis. yes  Discussion included meaning and risk of False Positives. yes  Discussion included meaning of total radiation exposure. yes  Counseling Included:  Importance of adherence to annual lung cancer LDCT screening. yes  Impact of comorbidities on ability to participate in the program. yes  Ability and willingness to under diagnostic treatment. yes  Smoking Cessation Counseling:  Current Smokers:   Discussed importance of smoking cessation. yes  Information about tobacco cessation classes and interventions provided to patient. yes  Patient provided with "ticket" for LDCT Scan. yes  Symptomatic Patient. no  Counseling  Diagnosis Code: Tobacco Use Z72.0  Asymptomatic Patient yes  Counseling (Intermediate counseling: > three minutes counseling) [id number]  Former Smokers:   Discussed the importance of maintaining cigarette abstinence. yes  Diagnosis Code: Personal History of Nicotine Dependence. U98.119Z87.891  Information about tobacco cessation classes and interventions provided to patient. Yes  Patient provided with "ticket" for LDCT Scan. yes  Written Order  for Lung Cancer Screening with LDCT placed in Epic. Yes (CT Chest Lung Cancer Screening Low Dose W/O CM) [id number] Z12.2-Screening of respiratory organs Z87.891-Personal history of nicotine dependence  I have spent 25 minutes of face to face time with Mrs. Arbutus PedFrost discussing the risks and benefits of lung cancer screening. We viewed a power point together that explained in detail the above noted topics. We paused at intervals to allow for questions to be asked and answered to ensure understanding.We discussed that the single most powerful action that she can take to decrease her risk of developing lung cancer is to quit smoking. We discussed whether or not she is ready to commit to setting a quit date. We discussed options for tools to aid in quitting smoking including nicotine replacement therapy, non-nicotine medications, support groups, Quit Smart classes, and behavior modification. We discussed that often times setting smaller, more achievable goals, such as eliminating 1 cigarette a day for a week and then 2 cigarettes a day for a week can be helpful in slowly decreasing the number of cigarettes smoked. This allows for a sense of accomplishment as well as providing a clinical benefit. I gave Mrs. Arbutus PedFrost the " Be Stronger Than Your Excuses" card with contact information for community resources, classes, free nicotine replacement therapy, and access to mobile apps, text messaging, and on-line smoking cessation help. I have also given her my card and contact information in the event she needs to contact me. We discussed the time and location of the scan, and that either Abigail Miyamotoenise Phelps RN or I will call with the results within 24-48 hours of receiving them. I have offered her  a copy of the power point we viewed  as a resource in the event they need reinforcement  of the concepts we discussed today in the office. The patient verbalized understanding of all of  the above and had no further questions upon leaving the  office. They have my contact information in the event they have any further questions.  I spent 4  minutes counseling on smoking cessation and the health risks of continued tobacco abuse.  I explained to the patient that there has been a high incidence of coronary artery disease noted on these exams. I explained that this is a non-gated exam therefore degree or severity cannot be determined. This patient is currently on statin therapy. I have asked the patient to follow-up with their PCP regarding any incidental finding of coronary artery disease and management with diet or medication as their PCP  feels is clinically indicated. The patient verbalized understanding of the above and had no further questions upon completion of the visit.   Bevelyn Ngo, NP 09/15/2016

## 2016-11-09 ENCOUNTER — Ambulatory Visit (INDEPENDENT_AMBULATORY_CARE_PROVIDER_SITE_OTHER)
Admission: RE | Admit: 2016-11-09 | Discharge: 2016-11-09 | Disposition: A | Discharge status: Home / Self Care | Payer: Medicare Other | Source: Ambulatory Visit | Attending: Internal Medicine | Admitting: Internal Medicine

## 2016-11-09 ENCOUNTER — Ambulatory Visit (INDEPENDENT_AMBULATORY_CARE_PROVIDER_SITE_OTHER): Payer: Medicare Other | Admitting: Internal Medicine

## 2016-11-09 ENCOUNTER — Encounter: Payer: Self-pay | Admitting: Internal Medicine

## 2016-11-09 VITALS — BP 144/90 | HR 116 | Ht 64.0 in | Wt 195.0 lb

## 2016-11-09 DIAGNOSIS — R05 Cough: Secondary | ICD-10-CM

## 2016-11-09 DIAGNOSIS — R030 Elevated blood-pressure reading, without diagnosis of hypertension: Secondary | ICD-10-CM | POA: Diagnosis not present

## 2016-11-09 DIAGNOSIS — R059 Cough, unspecified: Secondary | ICD-10-CM

## 2016-11-09 DIAGNOSIS — J441 Chronic obstructive pulmonary disease with (acute) exacerbation: Secondary | ICD-10-CM

## 2016-11-09 DIAGNOSIS — J309 Allergic rhinitis, unspecified: Secondary | ICD-10-CM | POA: Diagnosis not present

## 2016-11-09 DIAGNOSIS — E119 Type 2 diabetes mellitus without complications: Secondary | ICD-10-CM

## 2016-11-09 MED ORDER — ALBUTEROL SULFATE HFA 108 (90 BASE) MCG/ACT IN AERS: 2.0000 | INHALATION_SPRAY | Freq: Four times a day (QID) | RESPIRATORY_TRACT | 2 refills | Status: DC | PRN

## 2016-11-09 MED ORDER — LEVOFLOXACIN 250 MG PO TABS: 250.0000 mg | ORAL_TABLET | Freq: Every day | ORAL | 0 refills | Status: AC

## 2016-11-09 MED ORDER — PREDNISONE 10 MG PO TABS: ORAL_TABLET | ORAL | 0 refills | Status: DC

## 2016-11-09 MED ORDER — HYDROCODONE-HOMATROPINE 5-1.5 MG/5ML PO SYRP: 5.0000 mL | ORAL_SOLUTION | Freq: Four times a day (QID) | ORAL | 0 refills | Status: AC | PRN

## 2016-11-09 NOTE — Progress Notes (Signed)
Subjective:    Patient ID: Brittany Oconnor, female    DOB: Apr 04, 1945, 71 y.o.   MRN: [id number]  HPI  Here with acute onset mild to mod 2-3 days ST, HA, general weakness and malaise, with prod cough greenish sputum, but Pt denies chest pain, increased sob or doe, wheezing, orthopnea, PND, increased LE swelling, palpitations, dizziness or syncope, except for mild wheezing onset last PM.   Pt denies polydipsia, polyuria.   Does have several wks ongoing nasal allergy symptoms with clearish congestion, itch and sneezing, without fever, pain,   No other new hx Past Medical History:  Diagnosis Date  . ALLERGIC RHINITIS 11/24/2006  . Allergy   . ANXIETY 07/10/2008  . DIABETES MELLITUS, TYPE II 11/24/2006  . GERD (gastroesophageal reflux disease)   . History of shingles   . HYPERLIPIDEMIA 11/24/2006   Past Surgical History:  Procedure Laterality Date  . BREAST BIOPSY Right    benign  . MOUTH SURGERY      reports that she has been smoking Cigarettes.  She has a 47.00 pack-year smoking history. She has never used smokeless tobacco. She reports that she does not drink alcohol or use drugs. family history includes Colon cancer (age of onset: 8) in her sister; Colon cancer (age of onset: 19) in her sister; Heart disease in her sister; Hypertension in her father. Allergies  Allergen Reactions  . Doxycycline    Current Outpatient Prescriptions on File Prior to Visit  Medication Sig Dispense Refill  . alendronate (FOSAMAX) 70 MG tablet Take 1 tablet (70 mg total) by mouth every 7 (seven) days. Take with a full glass of water on an empty stomach. 12 tablet 3  . aspirin 81 MG tablet Take 81 mg by mouth daily.    . metFORMIN (GLUCOPHAGE-XR) 500 MG 24 hr tablet TAKE 3 TABLETS EVERY MORNING 270 tablet 3  . simvastatin (ZOCOR) 20 MG tablet Take 1 tablet (20 mg total) by mouth every evening. 90 tablet 3   No current facility-administered medications on file prior to visit.    Review of Systems  Constitutional: Negative for other unusual diaphoresis or sweats HENT: Negative for ear discharge or swelling Eyes: Negative for other worsening visual disturbances Respiratory: Negative for stridor or other swelling  Gastrointestinal: Negative for worsening distension or other blood Genitourinary: Negative for retention or other urinary change Musculoskeletal: Negative for other MSK pain or swelling Skin: Negative for color change or other new lesions Neurological: Negative for worsening tremors and other numbness  Psychiatric/Behavioral: Negative for worsening agitation or other fatigue All other system neg per pt    Objective:   Physical Exam BP (!) 144/90   Pulse (!) 116   Ht 5\' 4"  (1.626 m)   Wt 195 lb (88.5 kg)   SpO2 97%   BMI 33.47 kg/m  VS noted, mild ill Constitutional: Pt appears in NAD HENT: Head: NCAT.  Right Ear: External ear normal.  Left Ear: External ear normal.  Eyes: . Pupils are equal, round, and reactive to light. Conjunctivae and EOM are normal Nose: without d/c or deformity Bilat tm's with mild erythema.  Max sinus areas non tender.  Pharynx with mild erythema, no exudate Neck: Neck supple. Gross normal ROM Cardiovascular: Normal rate and regular rhythm.   Pulmonary/Chest: Effort normal and breath sounds decreased without rales but with mild bilat scattered wheezing.  Neurological: Pt is alert. At baseline orientation, motor grossly intact Skin: Skin is warm. No rashes, other new lesions, no LE edema  Psychiatric: Pt behavior is normal without agitation  No other exam findings    Assessment & Plan:

## 2016-11-09 NOTE — Patient Instructions (Signed)
Please take all new medication as prescribed - the antibiotic, cough medicine as needed, and prednisone  Please call if you change your mind about the inhaler prescription being sent  You can also take Delsym OTC for cough, and/or Mucinex (or it's generic off brand) for congestion, and tylenol as needed for pain.  Please continue all other medications as before, and refills have been done if requested.  Please have the pharmacy call with any other refills you may need.  Please keep your appointments with your specialists as you may have planned  Please go to the XRAY Department in the Basement (go straight as you get off the elevator) for the x-ray testing  You will be contacted by phone if any changes need to be made immediately.  Otherwise, you will receive a letter about your results with an explanation, but please check with MyChart first.  Please remember to sign up for MyChart if you have not done so, as this will be important to you in the future with finding out test results, communicating by private email, and scheduling acute appointments online when needed.

## 2016-11-10 NOTE — Assessment & Plan Note (Signed)
/  Mild to mod, for predpac asd,  to f/u any worsening symptoms or concerns 

## 2016-11-10 NOTE — Assessment & Plan Note (Signed)
Mild to mod, c/w bronchitis vs pna, for cxr, for antibx course, cough med prn,  to f/u any worsening symptoms or concerns 

## 2016-11-10 NOTE — Assessment & Plan Note (Signed)
For predpac asd,  to f/u any worsening symptoms or concerns 

## 2016-11-10 NOTE — Assessment & Plan Note (Signed)
stable overall by history and exam, recent data reviewed with pt, and pt to continue medical treatment as before,  to f/u any worsening symptoms or concerns Lab Results  Component Value Date   HGBA1C 7.1 (H) 08/24/2016

## 2017-02-24 ENCOUNTER — Ambulatory Visit: Payer: Medicare Other | Admitting: Internal Medicine

## 2017-04-06 ENCOUNTER — Ambulatory Visit: Payer: Medicare Other | Admitting: Internal Medicine

## 2017-05-10 ENCOUNTER — Encounter: Payer: Self-pay | Admitting: Internal Medicine

## 2017-05-10 ENCOUNTER — Other Ambulatory Visit (INDEPENDENT_AMBULATORY_CARE_PROVIDER_SITE_OTHER): Payer: Medicare Other

## 2017-05-10 ENCOUNTER — Ambulatory Visit (INDEPENDENT_AMBULATORY_CARE_PROVIDER_SITE_OTHER): Payer: Medicare Other | Admitting: Internal Medicine

## 2017-05-10 VITALS — BP 132/86 | HR 98 | Temp 97.8°F | Ht 64.0 in | Wt 193.0 lb

## 2017-05-10 DIAGNOSIS — Z1159 Encounter for screening for other viral diseases: Secondary | ICD-10-CM | POA: Diagnosis not present

## 2017-05-10 DIAGNOSIS — E119 Type 2 diabetes mellitus without complications: Secondary | ICD-10-CM

## 2017-05-10 DIAGNOSIS — Z0000 Encounter for general adult medical examination without abnormal findings: Secondary | ICD-10-CM | POA: Diagnosis not present

## 2017-05-10 DIAGNOSIS — R1310 Dysphagia, unspecified: Secondary | ICD-10-CM | POA: Diagnosis not present

## 2017-05-10 DIAGNOSIS — Z23 Encounter for immunization: Secondary | ICD-10-CM | POA: Diagnosis not present

## 2017-05-10 LAB — BASIC METABOLIC PANEL
BUN: 16 mg/dL (ref 6–23)
CALCIUM: 9.4 mg/dL (ref 8.4–10.5)
CO2: 32 meq/L (ref 19–32)
Chloride: 103 mEq/L (ref 96–112)
Creatinine, Ser: 0.98 mg/dL (ref 0.40–1.20)
GFR: 71.55 mL/min (ref >60.00)
GLUCOSE: 133 mg/dL — AB (ref 70–99)
POTASSIUM: 4.8 meq/L (ref 3.5–5.1)
Sodium: 140 mEq/L (ref 135–145)

## 2017-05-10 LAB — HEPATIC FUNCTION PANEL
ALT: 10 U/L (ref 0–35)
AST: 13 U/L (ref 0–37)
Albumin: 4.2 g/dL (ref 3.5–5.2)
Alkaline Phosphatase: 68 U/L (ref 39–117)
BILIRUBIN TOTAL: 0.4 mg/dL (ref 0.2–1.2)
Bilirubin, Direct: 0.1 mg/dL (ref 0.0–0.3)
Total Protein: 7.3 g/dL (ref 6.0–8.3)

## 2017-05-10 LAB — URINALYSIS, ROUTINE W REFLEX MICROSCOPIC
Bilirubin Urine: NEGATIVE
HGB URINE DIPSTICK: NEGATIVE
Ketones, ur: NEGATIVE
Nitrite: NEGATIVE
SPECIFIC GRAVITY, URINE: 1.02 (ref 1.000–1.030)
Total Protein, Urine: NEGATIVE
Urine Glucose: NEGATIVE
Urobilinogen, UA: 0.2 (ref 0.0–1.0)
pH: 6 (ref 5.0–8.0)

## 2017-05-10 LAB — LIPID PANEL
CHOL/HDL RATIO: 3
Cholesterol: 135 mg/dL (ref 0–200)
HDL: 45 mg/dL (ref >39.00)
LDL CALC: 61 mg/dL (ref 0–99)
NONHDL: 90.37
Triglycerides: 147 mg/dL (ref 0.0–149.0)
VLDL: 29.4 mg/dL (ref 0.0–40.0)

## 2017-05-10 LAB — CBC WITH DIFFERENTIAL/PLATELET
Basophils Absolute: 0 10*3/uL (ref 0.0–0.1)
Basophils Relative: 0.3 % (ref 0.0–3.0)
EOS PCT: 0.8 % (ref 0.0–5.0)
Eosinophils Absolute: 0.1 10*3/uL (ref 0.0–0.7)
HCT: 43.6 % (ref 36.0–46.0)
Hemoglobin: 14.4 g/dL (ref 12.0–15.0)
LYMPHS ABS: 3.1 10*3/uL (ref 0.7–4.0)
Lymphocytes Relative: 38.1 % (ref 12.0–46.0)
MCHC: 33.1 g/dL (ref 30.0–36.0)
MCV: 83.7 fl (ref 78.0–100.0)
MONO ABS: 0.6 10*3/uL (ref 0.1–1.0)
Monocytes Relative: 7.3 % (ref 3.0–12.0)
NEUTROS PCT: 53.5 % (ref 43.0–77.0)
Neutro Abs: 4.3 10*3/uL (ref 1.4–7.7)
Platelets: 263 10*3/uL (ref 150.0–400.0)
RBC: 5.2 Mil/uL — AB (ref 3.87–5.11)
RDW: 13.9 % (ref 11.5–15.5)
WBC: 8 10*3/uL (ref 4.0–10.5)

## 2017-05-10 LAB — HEMOGLOBIN A1C: Hgb A1c MFr Bld: 7 % — ABNORMAL HIGH (ref 4.6–6.5)

## 2017-05-10 LAB — TSH: TSH: 1.83 u[IU]/mL (ref 0.35–4.50)

## 2017-05-10 LAB — MICROALBUMIN / CREATININE URINE RATIO
CREATININE, U: 109.8 mg/dL
MICROALB UR: 7.6 mg/dL — AB (ref 0.0–1.9)
Microalb Creat Ratio: 6.9 mg/g (ref 0.0–30.0)

## 2017-05-10 NOTE — Patient Instructions (Addendum)
You had the Pneumovax pneumonia shot today  Please continue all other medications as before, and refills have been done if requested.  Please have the pharmacy call with any other refills you may need.  Please continue your efforts at being more active, low cholesterol diet, and weight control.  You are otherwise up to date with prevention measures today.  You will be contacted regarding the referral for: Gastroenterology  Please keep your appointments with your specialists as you may have planned  Please go to the LAB in the Basement (turn left off the elevator) for the tests to be done today  You will be contacted by phone if any changes need to be made immediately.  Otherwise, you will receive a letter about your results with an explanation, but please check with MyChart first.  Please remember to sign up for MyChart if you have not done so, as this will be important to you in the future with finding out test results, communicating by private email, and scheduling acute appointments online when needed.  Please return in 6 months, or sooner if needed, with Lab testing done 3-5 days before

## 2017-05-10 NOTE — Assessment & Plan Note (Signed)

## 2017-05-10 NOTE — Assessment & Plan Note (Signed)
stable overall by history and exam, recent data reviewed with pt, and pt to continue medical treatment as before,  to f/u any worsening symptoms or concerns Lab Results  Component Value Date   HGBA1C 7.1 (H) 08/24/2016  for f/u lab today

## 2017-05-10 NOTE — Progress Notes (Signed)
Subjective:    Patient ID: Brittany Oconnor, female    DOB: Feb 25, 1946, 72 y.o.   MRN: [id number]  HPI  Here for wellness and f/u;  Overall doing ok;  Pt denies Chest pain, worsening SOB, DOE, wheezing, orthopnea, PND, worsening LE edema, palpitations, dizziness or syncope.  Pt denies neurological change such as new headache, facial or extremity weakness.  Pt denies polydipsia, polyuria, or low sugar symptoms. Pt states overall good compliance with treatment and medications, good tolerability, and has been trying to follow appropriate diet.  Pt denies worsening depressive symptoms, suicidal ideation or panic. No fever, night sweats, wt loss, loss of appetite, or other constitutional symptoms.  Pt states good ability with ADL's, has low fall risk, home safety reviewed and adequate, no other significant changes in hearing or vision, and not active with exercise. Declines flu shot Also c/o Dysphagia to solids occasional and sometimes choking on liquids, new onset x m2-3 mo, without vomiting, wt loss or blood.  Cont's to smoke, cannot quit but is concerned about head and neck ca. Past Medical History:  Diagnosis Date  . ALLERGIC RHINITIS 11/24/2006  . Allergy   . ANXIETY 07/10/2008  . DIABETES MELLITUS, TYPE II 11/24/2006  . GERD (gastroesophageal reflux disease)   . History of shingles   . HYPERLIPIDEMIA 11/24/2006   Past Surgical History:  Procedure Laterality Date  . BREAST BIOPSY Right    benign  . MOUTH SURGERY      reports that she has been smoking cigarettes.  She has a 47.00 pack-year smoking history. she has never used smokeless tobacco. She reports that she does not drink alcohol or use drugs. family history includes Colon cancer (age of onset: 5567) in her sister; Colon cancer (age of onset: 2670) in her sister; Heart disease in her sister; Hypertension in her father. Allergies  Allergen Reactions  . Doxycycline    Current Outpatient Medications on File Prior to Visit  Medication Sig  Dispense Refill  . albuterol (PROVENTIL HFA;VENTOLIN HFA) 108 (90 Base) MCG/ACT inhaler Inhale 2 puffs into the lungs every 6 (six) hours as needed for wheezing or shortness of breath. 1 Inhaler 2  . alendronate (FOSAMAX) 70 MG tablet Take 1 tablet (70 mg total) by mouth every 7 (seven) days. Take with a full glass of water on an empty stomach. 12 tablet 3  . aspirin 81 MG tablet Take 81 mg by mouth daily.    . metFORMIN (GLUCOPHAGE-XR) 500 MG 24 hr tablet TAKE 3 TABLETS EVERY MORNING 270 tablet 3  . predniSONE (DELTASONE) 10 MG tablet 3 tabs by mouth per day for 3 days,2tabs per day for 3 days,1tab per day for 3 days 18 tablet 0  . simvastatin (ZOCOR) 20 MG tablet Take 1 tablet (20 mg total) by mouth every evening. 90 tablet 3   No current facility-administered medications on file prior to visit.    Review of Systems Constitutional: Negative for other unusual diaphoresis, sweats, appetite or weight changes HENT: Negative for other worsening hearing loss, ear pain, facial swelling, mouth sores or neck stiffness.   Eyes: Negative for other worsening pain, redness or other visual disturbance.  Respiratory: Negative for other stridor or swelling Cardiovascular: Negative for other palpitations or other chest pain  Gastrointestinal: Negative for worsening diarrhea or loose stools, blood in stool, distention or other pain Genitourinary: Negative for hematuria, flank pain or other change in urine volume.  Musculoskeletal: Negative for myalgias or other joint swelling.  Skin: Negative for  other color change, or other wound or worsening drainage.  Neurological: Negative for other syncope or numbness. Hematological: Negative for other adenopathy or swelling Psychiatric/Behavioral: Negative for hallucinations, other worsening agitation, SI, self-injury, or new decreased concentration All other system neg per pt    Objective:   Physical Exam BP 132/86   Pulse 98   Temp 97.8 F (36.6 C) (Oral)   Ht  5\' 4"  (1.626 m)   Wt 193 lb (87.5 kg)   SpO2 100%   BMI 33.13 kg/m  VS noted,  Constitutional: Pt is oriented to person, place, and time. Appears well-developed and well-nourished, in no significant distress and comfortable Head: Normocephalic and atraumatic  Eyes: Conjunctivae and EOM are normal. Pupils are equal, round, and reactive to light Right Ear: External ear normal without discharge Left Ear: External ear normal without discharge Nose: Nose without discharge or deformity Mouth/Throat: Oropharynx is without other ulcerations and moist  Neck: Normal range of motion. Neck supple. No JVD present. No tracheal deviation present or significant neck LA or mass Cardiovascular: Normal rate, regular rhythm, normal heart sounds and intact distal pulses.   Pulmonary/Chest: WOB normal and breath sounds without rales or wheezing  Abdominal: Soft. Bowel sounds are normal. NT. No HSM  Musculoskeletal: Normal range of motion. Exhibits no edema Lymphadenopathy: Has no other cervical adenopathy.  Neurological: Pt is alert and oriented to person, place, and time. Pt has normal reflexes. No cranial nerve deficit. Motor grossly intact, Gait intact Skin: Skin is warm and dry. No rash noted or new ulcerations Psychiatric:  Has nervous mood and affect. Behavior is normal without agitation No other exam findings       Assessment & Plan:

## 2017-05-10 NOTE — Assessment & Plan Note (Signed)
Etiology unclear, for refer GI, may need EGD

## 2017-05-11 LAB — HEPATITIS C ANTIBODY
Hepatitis C Ab: NONREACTIVE
SIGNAL TO CUT-OFF: 0.03 (ref <1.00)

## 2017-08-02 ENCOUNTER — Encounter: Payer: Self-pay | Admitting: Internal Medicine

## 2017-08-02 ENCOUNTER — Ambulatory Visit (INDEPENDENT_AMBULATORY_CARE_PROVIDER_SITE_OTHER): Payer: Medicare Other | Admitting: Internal Medicine

## 2017-08-02 VITALS — BP 134/88 | HR 96 | Ht 64.0 in | Wt 183.2 lb

## 2017-08-02 DIAGNOSIS — R1310 Dysphagia, unspecified: Secondary | ICD-10-CM | POA: Diagnosis not present

## 2017-08-02 NOTE — Patient Instructions (Signed)
You have been scheduled for a Barium Esophogram at [facility] (1st floor of the hospital) on 08/09/17 at 11:00 am. Please arrive 15 minutes prior to your appointment for registration. Make certain not to have anything to eat or drink 3 hours prior to your test. If you need to reschedule for any reason, please contact radiology at [phone] to do so. __________________________________________________________________ A barium swallow is an examination that concentrates on views of the esophagus. This tends to be a double contrast exam (barium and two liquids which, when combined, create a gas to distend the wall of the oesophagus) or single contrast (non-ionic iodine based). The study is usually tailored to your symptoms so a good history is essential. Attention is paid during the study to the form, structure and configuration of the esophagus, looking for functional disorders (such as aspiration, dysphagia, achalasia, motility and reflux) EXAMINATION You may be asked to change into a gown, depending on the type of swallow being performed. A radiologist and radiographer will perform the procedure. The radiologist will advise you of the type of contrast selected for your procedure and direct you during the exam. You will be asked to stand, sit or lie in several different positions and to hold a small amount of fluid in your mouth before being asked to swallow while the imaging is performed .In some instances you may be asked to swallow barium coated marshmallows to assess the motility of a solid food bolus. The exam can be recorded as a digital or video fluoroscopy procedure. POST PROCEDURE It will take 1-2 days for the barium to pass through your system. To facilitate this, it is important, unless otherwise directed, to increase your fluids for the next 24-48hrs and to resume your normal diet.  This test typically takes about 30 minutes to  perform. _________________________________________________________________________  If you are age 5 or older, your body mass index should be between 23-30. Your Body mass index is 31.45 kg/m. If this is out of the aforementioned range listed, please consider follow up with your Primary Care Provider.  If you are age 31 or younger, your body mass index should be between 19-25. Your Body mass index is 31.45 kg/m. If this is out of the aformentioned range listed, please consider follow up with your Primary Care Provider.

## 2017-08-02 NOTE — Progress Notes (Signed)
Patient ID: Brittany Oconnor, female   DOB: December 11, 1945, 72 y.o.   MRN: [id number] HPI: Brittany Oconnor is a 71 year old female with a past medical history of diabetes, hyperlipidemia, family history of colon cancer in 2 sisters who is seen to evaluate trouble swallowing.  She is here alone today.  She is known to me from screening colonoscopy which was performed on 06/17/2015, though this is her first visit in the clinic.  She is here alone today.  She had a normal screening colonoscopy with the exception of diverticulosis in the left colon in March 2017.  5-year recall was recommended based on family history.  Today she reports that she is having intermittent issues with the sensation of food sticking in her neck and upper chest.  She reports that she will feel that food collects after swallowing and she needs to "gargle" to get this out.  This is been happening for about 6 months.  This seems to be only present with foods containing liquids such as watermelon or grapes.  She has no solid food dysphagia to steak's chickens or bread.  She can take large tablets such as metformin without dysphagia.  No odynophagia.  No heartburn or indigestion.  No nausea or vomiting.  No weight loss.  No abdominal pain.  Bowel habits are regular.  She does smoke since her mid 30s about a pack every 1.5 days.    Past Medical History:  Diagnosis Date  . ALLERGIC RHINITIS 11/24/2006  . Allergy   . ANXIETY 07/10/2008  . DIABETES MELLITUS, TYPE II 11/24/2006  . GERD (gastroesophageal reflux disease)   . History of shingles   . HYPERLIPIDEMIA 11/24/2006    Past Surgical History:  Procedure Laterality Date  . BREAST BIOPSY Right    benign  . MOUTH SURGERY      Outpatient Medications Prior to Visit  Medication Sig Dispense Refill  . aspirin 81 MG tablet Take 81 mg by mouth daily.    . metFORMIN (GLUCOPHAGE-XR) 500 MG 24 hr tablet TAKE 3 TABLETS EVERY MORNING 270 tablet 3  . simvastatin (ZOCOR) 20 MG tablet Take 1 tablet  (20 mg total) by mouth every evening. 90 tablet 3  . albuterol (PROVENTIL HFA;VENTOLIN HFA) 108 (90 Base) MCG/ACT inhaler Inhale 2 puffs into the lungs every 6 (six) hours as needed for wheezing or shortness of breath. 1 Inhaler 2  . alendronate (FOSAMAX) 70 MG tablet Take 1 tablet (70 mg total) by mouth every 7 (seven) days. Take with a full glass of water on an empty stomach. 12 tablet 3  . predniSONE (DELTASONE) 10 MG tablet 3 tabs by mouth per day for 3 days,2tabs per day for 3 days,1tab per day for 3 days 18 tablet 0   No facility-administered medications prior to visit.     Allergies  Allergen Reactions  . Doxycycline     Family History  Problem Relation Age of Onset  . Colon cancer Sister 3       deceased   . Heart disease Sister        died of heart attack  . Colon cancer Sister 59       alive  . Hypertension Father   . Appendicitis Father        rupture  . Stroke Mother        stroke 59;died age 67    Social History   Tobacco Use  . Smoking status: Current Every Day Smoker    Packs/day: 1.00  Years: 47.00    Pack years: 47.00    Types: Cigarettes  . Smokeless tobacco: Never Used  . Tobacco comment: 1 ppd  Substance Use Topics  . Alcohol use: No    Alcohol/week: 0.0 oz  . Drug use: No    ROS: As per history of present illness, otherwise negative  BP 134/88   Pulse 96   Ht  (1.626 m)   Wt 183 lb 4 oz (83.1 kg)   BMI 31.45 kg/m  Constitutional: Well-developed and well-nourished. No distress. HEENT: Normocephalic and atraumatic. Oropharynx is clear and moist. Conjunctivae are normal.  No scleral icterus. Neck: Neck supple. Trachea midline. Cardiovascular: Normal rate, regular rhythm and intact distal pulses. No M/R/G Pulmonary/chest: Effort normal and breath sounds normal. No wheezing, rales or rhonchi. Abdominal: Soft, nontender, nondistended. Bowel sounds active throughout.  Extremities: no clubbing, cyanosis, or edema Neurological: Alert and  oriented to person place and time. Skin: Skin is warm and dry.  Psychiatric: Normal mood and affect. Behavior is normal.  RELEVANT LABS AND IMAGING: CBC    Component Value Date/Time   WBC 8.0 05/10/2017 0930   RBC 5.20 (H) 05/10/2017 0930   HGB 14.4 05/10/2017 0930   HCT 43.6 05/10/2017 0930   PLT 263.0 05/10/2017 0930   MCV 83.7 05/10/2017 0930   MCHC 33.1 05/10/2017 0930   RDW 13.9 05/10/2017 0930   LYMPHSABS 3.1 05/10/2017 0930   MONOABS 0.6 05/10/2017 0930   EOSABS 0.1 05/10/2017 0930   BASOSABS 0.0 05/10/2017 0930    CMP     Component Value Date/Time   NA 140 05/10/2017 0930   K 4.8 05/10/2017 0930   CL 103 05/10/2017 0930   CO2 32 05/10/2017 0930   GLUCOSE 133 (H) 05/10/2017 0930   BUN 16 05/10/2017 0930   CREATININE 0.98 05/10/2017 0930   CALCIUM 9.4 05/10/2017 0930   PROT 7.3 05/10/2017 0930   ALBUMIN 4.2 05/10/2017 0930   AST 13 05/10/2017 0930   ALT 10 05/10/2017 0930   ALKPHOS 68 05/10/2017 0930   BILITOT 0.4 05/10/2017 0930   GFRNONAA 72.21 03/13/2010 0825   GFRAA 72 03/07/2007 1506    ASSESSMENT/PLAN: 72 year old female with a past medical history of diabetes, hyperlipidemia, family history of colon cancer in 2 sisters who is seen to evaluate trouble swallowing.  1. Dysphagia --her symptoms seem more oropharyngeal than esophageal.  The way she describes food collecting I am suspicious for a possible Zenker's diverticulum.  I recommended that we proceed with barium esophagram with tablet to evaluate swallowing both oropharyngeal and esophageal.  Depending on the findings of this study she may in fact need upper endoscopy.  We discussed this today.  Further recommendations after her esophagram  2.  Family history of colon cancer --normal colonoscopy in March 2017, repeat colonoscopy recommended for screening in March 2022   ZO:XWRU, Len Blalock, Md [address] [place], [place] [address]

## 2017-08-09 ENCOUNTER — Ambulatory Visit (HOSPITAL_COMMUNITY)
Admission: RE | Admit: 2017-08-09 | Discharge: 2017-08-09 | Disposition: A | Discharge status: Home / Self Care | Payer: Medicare Other | Source: Ambulatory Visit | Attending: Internal Medicine | Admitting: Internal Medicine

## 2017-08-09 DIAGNOSIS — R1310 Dysphagia, unspecified: Secondary | ICD-10-CM | POA: Insufficient documentation

## 2017-08-10 ENCOUNTER — Other Ambulatory Visit: Payer: Self-pay

## 2017-08-10 DIAGNOSIS — R1312 Dysphagia, oropharyngeal phase: Secondary | ICD-10-CM

## 2017-08-19 ENCOUNTER — Other Ambulatory Visit (HOSPITAL_COMMUNITY): Payer: Self-pay | Admitting: Internal Medicine

## 2017-08-19 DIAGNOSIS — R1310 Dysphagia, unspecified: Secondary | ICD-10-CM

## 2017-08-23 ENCOUNTER — Ambulatory Visit (HOSPITAL_COMMUNITY)
Admission: RE | Admit: 2017-08-23 | Discharge: 2017-08-23 | Disposition: A | Discharge status: Home / Self Care | Payer: Medicare Other | Source: Ambulatory Visit | Attending: Internal Medicine | Admitting: Internal Medicine

## 2017-08-23 DIAGNOSIS — R1310 Dysphagia, unspecified: Secondary | ICD-10-CM | POA: Diagnosis present

## 2017-08-23 DIAGNOSIS — R1312 Dysphagia, oropharyngeal phase: Secondary | ICD-10-CM

## 2017-08-24 ENCOUNTER — Telehealth: Payer: Self-pay | Admitting: Internal Medicine

## 2017-08-24 NOTE — Telephone Encounter (Signed)
See result note.  

## 2017-08-26 ENCOUNTER — Other Ambulatory Visit: Payer: Self-pay

## 2017-08-31 ENCOUNTER — Telehealth: Payer: Self-pay | Admitting: Internal Medicine

## 2017-08-31 NOTE — Telephone Encounter (Signed)
Referral faxed to Dr. Narda Bondshris Newman at [phone]. Office to contact pt and schedule appt.

## 2017-08-31 NOTE — Telephone Encounter (Signed)
Pt scheduled to see Dr. Ezzard StandingNewman 09/07/17@10 :15am.

## 2017-09-16 ENCOUNTER — Other Ambulatory Visit: Payer: Self-pay | Admitting: Internal Medicine

## 2017-09-16 DIAGNOSIS — Z1231 Encounter for screening mammogram for malignant neoplasm of breast: Secondary | ICD-10-CM

## 2017-09-24 ENCOUNTER — Other Ambulatory Visit: Payer: Self-pay | Admitting: Internal Medicine

## 2017-09-26 ENCOUNTER — Ambulatory Visit (INDEPENDENT_AMBULATORY_CARE_PROVIDER_SITE_OTHER)
Admission: RE | Admit: 2017-09-26 | Discharge: 2017-09-26 | Disposition: A | Discharge status: Home / Self Care | Payer: Medicare Other | Source: Ambulatory Visit | Attending: Acute Care | Admitting: Acute Care

## 2017-09-26 ENCOUNTER — Other Ambulatory Visit: Payer: Self-pay | Admitting: Acute Care

## 2017-09-26 DIAGNOSIS — F17210 Nicotine dependence, cigarettes, uncomplicated: Secondary | ICD-10-CM | POA: Diagnosis not present

## 2017-09-26 DIAGNOSIS — Z122 Encounter for screening for malignant neoplasm of respiratory organs: Secondary | ICD-10-CM

## 2017-10-07 ENCOUNTER — Ambulatory Visit
Admission: RE | Admit: 2017-10-07 | Discharge: 2017-10-07 | Disposition: A | Discharge status: Home / Self Care | Payer: Medicare Other | Source: Ambulatory Visit | Attending: Internal Medicine | Admitting: Internal Medicine

## 2017-10-07 DIAGNOSIS — Z1231 Encounter for screening mammogram for malignant neoplasm of breast: Secondary | ICD-10-CM

## 2017-11-11 ENCOUNTER — Other Ambulatory Visit (INDEPENDENT_AMBULATORY_CARE_PROVIDER_SITE_OTHER): Payer: Medicare Other

## 2017-11-11 DIAGNOSIS — E119 Type 2 diabetes mellitus without complications: Secondary | ICD-10-CM | POA: Diagnosis not present

## 2017-11-11 LAB — BASIC METABOLIC PANEL
BUN: 16 mg/dL (ref 6–23)
CHLORIDE: 104 meq/L (ref 96–112)
CO2: 33 mEq/L — ABNORMAL HIGH (ref 19–32)
CREATININE: 1.02 mg/dL (ref 0.40–1.20)
Calcium: 9.8 mg/dL (ref 8.4–10.5)
GFR: 68.22 mL/min (ref >60.00)
GLUCOSE: 109 mg/dL — AB (ref 70–99)
POTASSIUM: 4.2 meq/L (ref 3.5–5.1)
Sodium: 140 mEq/L (ref 135–145)

## 2017-11-11 LAB — HEPATIC FUNCTION PANEL
ALK PHOS: 54 U/L (ref 39–117)
ALT: 8 U/L (ref 0–35)
AST: 14 U/L (ref 0–37)
Albumin: 4.1 g/dL (ref 3.5–5.2)
BILIRUBIN DIRECT: 0.1 mg/dL (ref 0.0–0.3)
TOTAL PROTEIN: 7.1 g/dL (ref 6.0–8.3)
Total Bilirubin: 0.5 mg/dL (ref 0.2–1.2)

## 2017-11-11 LAB — HEMOGLOBIN A1C: HEMOGLOBIN A1C: 6.7 % — AB (ref 4.6–6.5)

## 2017-11-11 LAB — LIPID PANEL
Cholesterol: 136 mg/dL (ref 0–200)
HDL: 56 mg/dL (ref >39.00)
LDL CALC: 59 mg/dL (ref 0–99)
NonHDL: 80.18
Total CHOL/HDL Ratio: 2
Triglycerides: 106 mg/dL (ref 0.0–149.0)
VLDL: 21.2 mg/dL (ref 0.0–40.0)

## 2017-11-15 ENCOUNTER — Encounter: Payer: Self-pay | Admitting: Internal Medicine

## 2017-11-15 ENCOUNTER — Ambulatory Visit (INDEPENDENT_AMBULATORY_CARE_PROVIDER_SITE_OTHER): Payer: Medicare Other | Admitting: Internal Medicine

## 2017-11-15 VITALS — BP 154/86 | HR 95 | Temp 98.3°F | Ht 64.0 in | Wt 183.0 lb

## 2017-11-15 DIAGNOSIS — R030 Elevated blood-pressure reading, without diagnosis of hypertension: Secondary | ICD-10-CM | POA: Diagnosis not present

## 2017-11-15 DIAGNOSIS — E119 Type 2 diabetes mellitus without complications: Secondary | ICD-10-CM

## 2017-11-15 DIAGNOSIS — E785 Hyperlipidemia, unspecified: Secondary | ICD-10-CM | POA: Diagnosis not present

## 2017-11-15 NOTE — Assessment & Plan Note (Signed)
stable overall by history and exam, recent data reviewed with pt, and pt to continue medical treatment as before,  to f/u any worsening symptoms or concerns Lab Results  Component Value Date   HGBA1C 6.7 (H) 11/11/2017

## 2017-11-15 NOTE — Assessment & Plan Note (Signed)
Mild elevated today, declines med tx, cont to work on wt loss

## 2017-11-15 NOTE — Assessment & Plan Note (Signed)
stable overall by history and exam, recent data reviewed with pt, and pt to continue medical treatment as before,  to f/u any worsening symptoms or concerns Lab Results  Component Value Date   LDLCALC 59 11/11/2017

## 2017-11-15 NOTE — Patient Instructions (Signed)
Please continue all other medications as before, and refills have been done if requested.  Please have the pharmacy call with any other refills you may need.  Please continue your efforts at being more active, low cholesterol diet, and weight control.  You are otherwise up to date with prevention measures today.  Please keep your appointments with your specialists as you may have planned  Please return in 6 months, or sooner if needed, with Lab testing done 3-5 days before  

## 2017-11-15 NOTE — Progress Notes (Signed)
Subjective:    Patient ID: Brittany Oconnor, female    DOB: 01-28-1946, 72 y.o.   MRN: [id number]  HPI  Here to f/u; overall doing ok,  Pt denies chest pain, increasing sob or doe, wheezing, orthopnea, PND, increased LE swelling, palpitations, dizziness or syncope.  Pt denies new neurological symptoms such as new headache, or facial or extremity weakness or numbness.  Pt denies polydipsia, polyuria, or low sugar episode.  Pt states overall good compliance with meds, mostly trying to follow appropriate diet, with wt overall stable,  but little exercise however.  No new complaints except for mild nonpainful numbness persistent to left 5th toe for several months, really only notices with trying to get to sleep.  BP at home was < 140/90 and was speeding to get her Past Medical History:  Diagnosis Date  . ALLERGIC RHINITIS 11/24/2006  . Allergy   . ANXIETY 07/10/2008  . DIABETES MELLITUS, TYPE II 11/24/2006  . GERD (gastroesophageal reflux disease)   . History of shingles   . HYPERLIPIDEMIA 11/24/2006   Past Surgical History:  Procedure Laterality Date  . BREAST BIOPSY Right    benign  . MOUTH SURGERY      reports that she has been smoking cigarettes. She has a 47.00 pack-year smoking history. She has never used smokeless tobacco. She reports that she does not drink alcohol or use drugs. family history includes Appendicitis in her father; Colon cancer (age of onset: 6967) in her sister; Colon cancer (age of onset: 7770) in her sister; Heart disease in her sister; Hypertension in her father; Stroke in her mother. Allergies  Allergen Reactions  . Doxycycline    Current Outpatient Medications on File Prior to Visit  Medication Sig Dispense Refill  . aspirin 81 MG tablet Take 81 mg by mouth daily.    . metFORMIN (GLUCOPHAGE-XR) 500 MG 24 hr tablet TAKE 3 TABLETS EVERY MORNING 270 tablet 3  . simvastatin (ZOCOR) 20 MG tablet TAKE 1 TABLET BY MOUTH EVERY DAY IN THE EVENING 90 tablet 3   No current  facility-administered medications on file prior to visit.     Review of Systems  Constitutional: Negative for other unusual diaphoresis or sweats HENT: Negative for ear discharge or swelling Eyes: Negative for other worsening visual disturbances Respiratory: Negative for stridor or other swelling  Gastrointestinal: Negative for worsening distension or other blood Genitourinary: Negative for retention or other urinary change Musculoskeletal: Negative for other MSK pain or swelling Skin: Negative for color change or other new lesions Neurological: Negative for worsening tremors and other numbness  Psychiatric/Behavioral: Negative for worsening agitation or other fatigue All other system neg per pt    Objective:   Physical Exam BP (!) 154/86   Pulse 95   Temp 98.3 F (36.8 C) (Oral)   Ht 5\' 4"  (1.626 m)   Wt 183 lb (83 kg)   SpO2 100%   BMI 31.41 kg/m  VS noted,  Constitutional: Pt appears in NAD HENT: Head: NCAT.  Right Ear: External ear normal.  Left Ear: External ear normal.  Eyes: . Pupils are equal, round, and reactive to light. Conjunctivae and EOM are normal Nose: without d/c or deformity Neck: Neck supple. Gross normal ROM Cardiovascular: Normal rate and regular rhythm.   Pulmonary/Chest: Effort normal and breath sounds without rales or wheezing.  Neurological: Pt is alert. At baseline orientation, motor grossly intact Skin: Skin is warm. No rashes, other new lesions, no LE edema Psychiatric: Pt behavior is normal  without agitation  No other exam findings Lab Results  Component Value Date   WBC 8.0 05/10/2017   HGB 14.4 05/10/2017   HCT 43.6 05/10/2017   PLT 263.0 05/10/2017   GLUCOSE 109 (H) 11/11/2017   CHOL 136 11/11/2017   TRIG 106.0 11/11/2017   HDL 56.00 11/11/2017   LDLDIRECT 99.9 03/07/2007   LDLCALC 59 11/11/2017   ALT 8 11/11/2017   AST 14 11/11/2017   NA 140 11/11/2017   K 4.2 11/11/2017   CL 104 11/11/2017   CREATININE 1.02 11/11/2017   BUN  16 11/11/2017   CO2 33 (H) 11/11/2017   TSH 1.83 05/10/2017   HGBA1C 6.7 (H) 11/11/2017   MICROALBUR 7.6 (H) 05/10/2017          Assessment & Plan:

## 2017-11-17 ENCOUNTER — Other Ambulatory Visit: Payer: Self-pay | Admitting: Internal Medicine

## 2018-03-23 ENCOUNTER — Encounter: Payer: Self-pay | Admitting: Internal Medicine

## 2018-03-23 ENCOUNTER — Ambulatory Visit (INDEPENDENT_AMBULATORY_CARE_PROVIDER_SITE_OTHER): Payer: Medicare Other | Admitting: Internal Medicine

## 2018-03-23 VITALS — BP 160/90 | HR 105 | Temp 97.8°F | Ht 64.0 in | Wt 190.0 lb

## 2018-03-23 DIAGNOSIS — F17210 Nicotine dependence, cigarettes, uncomplicated: Secondary | ICD-10-CM

## 2018-03-23 DIAGNOSIS — F17200 Nicotine dependence, unspecified, uncomplicated: Secondary | ICD-10-CM

## 2018-03-23 DIAGNOSIS — J069 Acute upper respiratory infection, unspecified: Secondary | ICD-10-CM | POA: Insufficient documentation

## 2018-03-23 DIAGNOSIS — B9789 Other viral agents as the cause of diseases classified elsewhere: Secondary | ICD-10-CM | POA: Diagnosis not present

## 2018-03-23 NOTE — Progress Notes (Signed)
   Subjective:   Patient ID: Brittany Oconnor, female    DOB: 11-Sep-1945, 72 y.o.   MRN: [id number]  HPI The patient is a 72 y.o. female smoker coming in for cold symptoms. Started about 1 week ago. Started taking claritin and this helped most of her symptoms. Stopped taking that about 2 days ago and symptoms are returning. This is typically her bad season for allergies anyway. Main symptoms are: sore throat and congestion and coughing as well as mild SOB. Last week she was having more nose drainage. Denies ear pains or pressure, headaches, fevers or chills, muscle aches. Overall it is worsening. Has tried claritin and rare flonase but not taking either for at least 2 days.   Review of Systems  Constitutional: Positive for activity change and appetite change. Negative for chills, fatigue, fever and unexpected weight change.  HENT: Positive for congestion and postnasal drip. Negative for ear discharge, ear pain, rhinorrhea, sinus pressure, sinus pain, sneezing, sore throat, tinnitus, trouble swallowing and voice change.   Eyes: Negative.   Respiratory: Positive for cough and shortness of breath. Negative for chest tightness and wheezing.   Cardiovascular: Negative.   Gastrointestinal: Negative.   Musculoskeletal: Negative for myalgias.  Neurological: Negative.     Objective:  Physical Exam Constitutional:      Appearance: She is well-developed.  HENT:     Head: Normocephalic and atraumatic.     Comments: Oropharynx with redness and clear drainage, nose with swollen turbinates, TMs normal bilaterally.  Neck:     Musculoskeletal: Normal range of motion.     Thyroid: No thyromegaly.  Cardiovascular:     Rate and Rhythm: Normal rate and regular rhythm.  Pulmonary:     Effort: Pulmonary effort is normal. No respiratory distress.     Breath sounds: Normal breath sounds. No wheezing or rales.     Comments: Coughing during exam non-productive Abdominal:     Palpations: Abdomen is soft.    Musculoskeletal:        General: Tenderness present.  Lymphadenopathy:     Cervical: No cervical adenopathy.  Skin:    General: Skin is warm and dry.  Neurological:     Mental Status: She is alert and oriented to person, place, and time.     Vitals:   03/23/18 1534  BP: (!) 160/90  Pulse: (!) 105  Temp: 97.8 F (36.6 C)  TempSrc: Oral  SpO2: 96%  Weight: 190 lb (86.2 kg)  Height: 5\' 4"  (1.626 m)    Assessment & Plan:

## 2018-03-23 NOTE — Assessment & Plan Note (Signed)
Offered albuterol inhaler rx but she declines. Advised she does not need steroids or antibiotics today and should resume claritin and taking flonase for next 1-2 weeks and she agrees. Will call back with worsening or lack of improvement in 1 week.

## 2018-03-23 NOTE — Patient Instructions (Signed)
Start taking the claritin and the flonase daily for the next 1-2 weeks.

## 2018-03-23 NOTE — Assessment & Plan Note (Signed)
Time spent counseling about tobacco usage: 3 minutes. I have asked about smoking and is smoking less than usual. The patient is advised to quit. The patient is maybe willing to quit. They would like to try to quit in the next 3 months. We will have them follow up with PCP.

## 2018-04-20 ENCOUNTER — Encounter: Payer: Self-pay | Admitting: Internal Medicine

## 2018-04-20 ENCOUNTER — Ambulatory Visit (INDEPENDENT_AMBULATORY_CARE_PROVIDER_SITE_OTHER): Payer: Medicare Other | Admitting: Internal Medicine

## 2018-04-20 VITALS — BP 124/86 | HR 105 | Temp 98.4°F | Ht 64.0 in | Wt 187.0 lb

## 2018-04-20 DIAGNOSIS — R05 Cough: Secondary | ICD-10-CM

## 2018-04-20 DIAGNOSIS — J209 Acute bronchitis, unspecified: Secondary | ICD-10-CM

## 2018-04-20 DIAGNOSIS — J309 Allergic rhinitis, unspecified: Secondary | ICD-10-CM | POA: Diagnosis not present

## 2018-04-20 DIAGNOSIS — J440 Chronic obstructive pulmonary disease with acute lower respiratory infection: Secondary | ICD-10-CM

## 2018-04-20 DIAGNOSIS — R059 Cough, unspecified: Secondary | ICD-10-CM

## 2018-04-20 MED ORDER — HYDROCODONE-HOMATROPINE 5-1.5 MG/5ML PO SYRP: 5.0000 mL | ORAL_SOLUTION | Freq: Four times a day (QID) | ORAL | 0 refills | Status: AC | PRN

## 2018-04-20 MED ORDER — AZITHROMYCIN 250 MG PO TABS: ORAL_TABLET | ORAL | 1 refills | Status: DC

## 2018-04-20 MED ORDER — PREDNISONE 10 MG PO TABS: ORAL_TABLET | ORAL | 0 refills | Status: DC

## 2018-04-20 NOTE — Assessment & Plan Note (Signed)
stable overall by history and exam, recent data reviewed with pt, and pt to continue medical treatment as before,  to f/u any worsening symptoms or concerns  

## 2018-04-20 NOTE — Assessment & Plan Note (Signed)
Mild to mod, most likely related to URI, for antibx course,  to f/u any worsening symptoms or concerns

## 2018-04-20 NOTE — Assessment & Plan Note (Signed)
Also for predpac asd, . to f/u any worsening symptoms or concerns 

## 2018-04-20 NOTE — Progress Notes (Signed)
Subjective:    Patient ID: Brittany Oconnor, female    DOB: 18-Dec-1945, 73 y.o.   MRN: [id number]  HPI   Here with 2-3 days acute onset fever, facial pain, pressure, headache, general weakness and malaise, and greenish d/c, with mild ST and cough, but pt denies chest pain, wheezing, increased sob or doe, orthopnea, PND, increased LE swelling, palpitations, dizziness or syncope.  Does have several wks ongoing nasal allergy symptoms with clearish congestion, itch and sneezing, without fever, pain, ST, cough, swelling or wheezing.   Pt denies new neurological symptoms such as new headache, or facial or extremity weakness or numbness   Pt denies polydipsia, polyuria, Past Medical History:  Diagnosis Date  . ALLERGIC RHINITIS 11/24/2006  . Allergy   . ANXIETY 07/10/2008  . DIABETES MELLITUS, TYPE II 11/24/2006  . GERD (gastroesophageal reflux disease)   . History of shingles   . HYPERLIPIDEMIA 11/24/2006   Past Surgical History:  Procedure Laterality Date  . BREAST BIOPSY Right    benign  . MOUTH SURGERY      reports that she has been smoking cigarettes. She has a 47.00 pack-year smoking history. She has never used smokeless tobacco. She reports that she does not drink alcohol or use drugs. family history includes Appendicitis in her father; Colon cancer (age of onset: 25) in her sister; Colon cancer (age of onset: 10) in her sister; Heart disease in her sister; Hypertension in her father; Stroke in her mother. Allergies  Allergen Reactions  . Doxycycline    Current Outpatient Medications on File Prior to Visit  Medication Sig Dispense Refill  . aspirin 81 MG tablet Take 81 mg by mouth daily.    . metFORMIN (GLUCOPHAGE-XR) 500 MG 24 hr tablet TAKE 3 TABLETS EVERY MORNING 270 tablet 3  . simvastatin (ZOCOR) 20 MG tablet TAKE 1 TABLET BY MOUTH EVERY DAY IN THE EVENING 90 tablet 3   No current facility-administered medications on file prior to visit.    Review of Systems  Constitutional:  Negative for other unusual diaphoresis or sweats HENT: Negative for ear discharge or swelling Eyes: Negative for other worsening visual disturbances Respiratory: Negative for stridor or other swelling  Gastrointestinal: Negative for worsening distension or other blood Genitourinary: Negative for retention or other urinary change Musculoskeletal: Negative for other MSK pain or swelling Skin: Negative for color change or other new lesions Neurological: Negative for worsening tremors and other numbness  Psychiatric/Behavioral: Negative for worsening agitation or other fatigue All other system neg per pt    Objective:   Physical Exam BP 124/86   Pulse (!) 105   Temp 98.4 F (36.9 C) (Oral)   Ht 5\' 4"  (1.626 m)   Wt 187 lb (84.8 kg)   SpO2 97%   BMI 32.10 kg/m  VS noted, mild ill Constitutional: Pt appears in NAD HENT: Head: NCAT.  Right Ear: External ear normal.  Left Ear: External ear normal.  Bilat tm's with mild erythema.  Max sinus areas mild tender.  Pharynx with mild erythema, no exudate Eyes: . Pupils are equal, round, and reactive to light. Conjunctivae and EOM are normal Nose: without d/c or deformity Neck: Neck supple. Gross normal ROM Cardiovascular: Normal rate and regular rhythm.   Pulmonary/Chest: Effort normal and breath sounds without rales or wheezing.  Neurological: Pt is alert. At baseline orientation, motor grossly intact Skin: Skin is warm. No rashes, other new lesions, no LE edema Psychiatric: Pt behavior is normal without agitation  No other  exam findings Lab Results  Component Value Date   WBC 8.0 05/10/2017   HGB 14.4 05/10/2017   HCT 43.6 05/10/2017   PLT 263.0 05/10/2017   GLUCOSE 109 (H) 11/11/2017   CHOL 136 11/11/2017   TRIG 106.0 11/11/2017   HDL 56.00 11/11/2017   LDLDIRECT 99.9 03/07/2007   LDLCALC 59 11/11/2017   ALT 8 11/11/2017   AST 14 11/11/2017   NA 140 11/11/2017   K 4.2 11/11/2017   CL 104 11/11/2017   CREATININE 1.02  11/11/2017   BUN 16 11/11/2017   CO2 33 (H) 11/11/2017   TSH 1.83 05/10/2017   HGBA1C 6.7 (H) 11/11/2017   MICROALBUR 7.6 (H) 05/10/2017       Assessment & Plan:

## 2018-04-20 NOTE — Patient Instructions (Signed)
Please take all new medication as prescribed - the antibiotic, cough medicine, and prednisone  Please continue all other medications as before, and refills have been done if requested.  Please have the pharmacy call with any other refills you may need.  Please continue your efforts at being more active, low cholesterol diet, and weight control.  Please keep your appointments with your specialists as you may have planned     

## 2018-09-25 ENCOUNTER — Other Ambulatory Visit: Payer: Self-pay | Admitting: Internal Medicine

## 2018-09-25 DIAGNOSIS — Z1231 Encounter for screening mammogram for malignant neoplasm of breast: Secondary | ICD-10-CM

## 2018-10-01 ENCOUNTER — Other Ambulatory Visit: Payer: Self-pay | Admitting: Internal Medicine

## 2018-10-10 ENCOUNTER — Telehealth: Payer: Self-pay | Admitting: Emergency Medicine

## 2018-10-10 DIAGNOSIS — Z0000 Encounter for general adult medical examination without abnormal findings: Secondary | ICD-10-CM

## 2018-10-10 NOTE — Telephone Encounter (Signed)
Pt has appt scheduled for 8/20 for CPE. Pt would like blood work to bed ordered before appt. Please advise.

## 2018-11-09 ENCOUNTER — Ambulatory Visit
Admission: RE | Admit: 2018-11-09 | Discharge: 2018-11-09 | Disposition: A | Discharge status: Home / Self Care | Payer: Medicare Other | Source: Ambulatory Visit | Attending: Internal Medicine | Admitting: Internal Medicine

## 2018-11-09 ENCOUNTER — Other Ambulatory Visit: Payer: Self-pay

## 2018-11-09 DIAGNOSIS — Z1231 Encounter for screening mammogram for malignant neoplasm of breast: Secondary | ICD-10-CM

## 2018-11-14 ENCOUNTER — Other Ambulatory Visit (INDEPENDENT_AMBULATORY_CARE_PROVIDER_SITE_OTHER): Payer: Medicare Other

## 2018-11-14 DIAGNOSIS — Z0000 Encounter for general adult medical examination without abnormal findings: Secondary | ICD-10-CM

## 2018-11-14 LAB — URINALYSIS, ROUTINE W REFLEX MICROSCOPIC
Bilirubin Urine: NEGATIVE
Hgb urine dipstick: NEGATIVE
Ketones, ur: NEGATIVE
Nitrite: NEGATIVE
Specific Gravity, Urine: 1.01 (ref 1.000–1.030)
Total Protein, Urine: NEGATIVE
Urine Glucose: NEGATIVE
Urobilinogen, UA: 0.2 (ref 0.0–1.0)
pH: 5.5 (ref 5.0–8.0)

## 2018-11-14 LAB — TSH: TSH: 1.16 u[IU]/mL (ref 0.35–4.50)

## 2018-11-14 LAB — LIPID PANEL
Cholesterol: 148 mg/dL (ref 0–200)
HDL: 49.9 mg/dL (ref >39.00)
LDL Cholesterol: 61 mg/dL (ref 0–99)
NonHDL: 97.79
Total CHOL/HDL Ratio: 3
Triglycerides: 182 mg/dL — ABNORMAL HIGH (ref 0.0–149.0)
VLDL: 36.4 mg/dL (ref 0.0–40.0)

## 2018-11-14 LAB — CBC WITH DIFFERENTIAL/PLATELET
Basophils Absolute: 0.1 10*3/uL (ref 0.0–0.1)
Basophils Relative: 1.1 % (ref 0.0–3.0)
Eosinophils Absolute: 0.1 10*3/uL (ref 0.0–0.7)
Eosinophils Relative: 0.7 % (ref 0.0–5.0)
HCT: 43.8 % (ref 36.0–46.0)
Hemoglobin: 14.4 g/dL (ref 12.0–15.0)
Lymphocytes Relative: 42.3 % (ref 12.0–46.0)
Lymphs Abs: 3.4 10*3/uL (ref 0.7–4.0)
MCHC: 32.7 g/dL (ref 30.0–36.0)
MCV: 83.7 fl (ref 78.0–100.0)
Monocytes Absolute: 0.6 10*3/uL (ref 0.1–1.0)
Monocytes Relative: 7.2 % (ref 3.0–12.0)
Neutro Abs: 3.9 10*3/uL (ref 1.4–7.7)
Neutrophils Relative %: 48.7 % (ref 43.0–77.0)
Platelets: 230 10*3/uL (ref 150.0–400.0)
RBC: 5.24 Mil/uL — ABNORMAL HIGH (ref 3.87–5.11)
RDW: 14.3 % (ref 11.5–15.5)
WBC: 8 10*3/uL (ref 4.0–10.5)

## 2018-11-14 LAB — HEPATIC FUNCTION PANEL
ALT: 9 U/L (ref 0–35)
AST: 14 U/L (ref 0–37)
Albumin: 4.2 g/dL (ref 3.5–5.2)
Alkaline Phosphatase: 59 U/L (ref 39–117)
Bilirubin, Direct: 0 mg/dL (ref 0.0–0.3)
Total Bilirubin: 0.3 mg/dL (ref 0.2–1.2)
Total Protein: 7.2 g/dL (ref 6.0–8.3)

## 2018-11-14 LAB — BASIC METABOLIC PANEL
BUN: 12 mg/dL (ref 6–23)
CO2: 27 mEq/L (ref 19–32)
Calcium: 9.5 mg/dL (ref 8.4–10.5)
Chloride: 105 mEq/L (ref 96–112)
Creatinine, Ser: 0.99 mg/dL (ref 0.40–1.20)
GFR: 66.25 mL/min (ref >60.00)
Glucose, Bld: 116 mg/dL — ABNORMAL HIGH (ref 70–99)
Potassium: 4.3 mEq/L (ref 3.5–5.1)
Sodium: 138 mEq/L (ref 135–145)

## 2018-11-14 LAB — HEMOGLOBIN A1C: Hgb A1c MFr Bld: 6.8 % — ABNORMAL HIGH (ref 4.6–6.5)

## 2018-11-16 ENCOUNTER — Ambulatory Visit (INDEPENDENT_AMBULATORY_CARE_PROVIDER_SITE_OTHER): Payer: Medicare Other | Admitting: Internal Medicine

## 2018-11-16 ENCOUNTER — Other Ambulatory Visit: Payer: Self-pay

## 2018-11-16 ENCOUNTER — Encounter: Payer: Self-pay | Admitting: Internal Medicine

## 2018-11-16 VITALS — BP 160/110 | HR 108 | Temp 98.1°F | Ht 64.0 in | Wt 188.0 lb

## 2018-11-16 DIAGNOSIS — E611 Iron deficiency: Secondary | ICD-10-CM

## 2018-11-16 DIAGNOSIS — Z23 Encounter for immunization: Secondary | ICD-10-CM | POA: Diagnosis not present

## 2018-11-16 DIAGNOSIS — Z0000 Encounter for general adult medical examination without abnormal findings: Secondary | ICD-10-CM

## 2018-11-16 DIAGNOSIS — R030 Elevated blood-pressure reading, without diagnosis of hypertension: Secondary | ICD-10-CM

## 2018-11-16 DIAGNOSIS — E538 Deficiency of other specified B group vitamins: Secondary | ICD-10-CM

## 2018-11-16 DIAGNOSIS — E559 Vitamin D deficiency, unspecified: Secondary | ICD-10-CM | POA: Diagnosis not present

## 2018-11-16 DIAGNOSIS — E119 Type 2 diabetes mellitus without complications: Secondary | ICD-10-CM | POA: Diagnosis not present

## 2018-11-16 NOTE — Patient Instructions (Addendum)
You had the flu shot today, and the Tdap tetanus shot today  Please continue all other medications as before, and refills have been done if requested.  Please have the pharmacy call with any other refills you may need.  Please continue your efforts at being more active, low cholesterol diet, and weight control.  You are otherwise up to date with prevention measures today.  Please keep your appointments with your specialists as you may have planned  Please return in 6 months, or sooner if needed, with Lab testing done 3-5 days before

## 2018-11-16 NOTE — Assessment & Plan Note (Signed)
Pt adamant BP at home < 14090, declines tx

## 2018-11-16 NOTE — Assessment & Plan Note (Signed)
stable overall by history and exam, recent data reviewed with pt, and pt to continue medical treatment as before,  to f/u any worsening symptoms or concerns  

## 2018-11-16 NOTE — Progress Notes (Signed)
Subjective:    Patient ID: Brittany Oconnor, female    DOB: [birth year], 73 y.o.   MRN: [id number]  HPI Here for wellness and f/u;  Overall doing ok;  Pt denies Chest pain, worsening SOB, DOE, wheezing, orthopnea, PND, worsening LE edema, palpitations, dizziness or syncope.  Pt denies neurological change such as new headache, facial or extremity weakness.  Pt denies polydipsia, polyuria, or low sugar symptoms. Pt states overall good compliance with treatment and medications, good tolerability, and has been trying to follow appropriate diet.  Pt denies worsening depressive symptoms, suicidal ideation or panic. No fever, night sweats, wt loss, loss of appetite, or other constitutional symptoms.  Pt states good ability with ADL's, has low fall risk, home safety reviewed and adequate, no other significant changes in hearing or vision, and only occasionally active with exercise. Pt plans to see optho but needs to reschedule due to pandemic.  Wt overall stable Wt Readings from Last 3 Encounters:  11/16/18 188 lb (85.3 kg)  04/20/18 187 lb (84.8 kg)  03/23/18 190 lb (86.2 kg)   BP Readings from Last 3 Encounters:  11/16/18 (!) 160/110  04/20/18 124/86  03/23/18 (!) 160/90  Smoked 2 cigs on the way here, unable to quit. Pt is adamant Bp at home < 140/90. Past Medical History:  Diagnosis Date   ALLERGIC RHINITIS 11/24/2006   Allergy    ANXIETY 07/10/2008   DIABETES MELLITUS, TYPE II 11/24/2006   GERD (gastroesophageal reflux disease)    History of shingles    HYPERLIPIDEMIA 11/24/2006   Past Surgical History:  Procedure Laterality Date   BREAST BIOPSY Right    benign   MOUTH SURGERY      reports that she has been smoking cigarettes. She has a 47.00 pack-year smoking history. She has never used smokeless tobacco. She reports that she does not drink alcohol or use drugs. family history includes Appendicitis in her father; Colon cancer (age of onset: 40) in her sister; Colon cancer (age of  onset: 46) in her sister; Heart disease in her sister; Hypertension in her father; Stroke in her mother. Allergies  Allergen Reactions   Doxycycline    Current Outpatient Medications on File Prior to Visit  Medication Sig Dispense Refill   aspirin 81 MG tablet Take 81 mg by mouth daily.     metFORMIN (GLUCOPHAGE-XR) 500 MG 24 hr tablet TAKE 3 TABLETS EVERY MORNING 270 tablet 3   simvastatin (ZOCOR) 20 MG tablet TAKE 1 TABLET BY MOUTH EVERY DAY IN THE EVENING 90 tablet 3   No current facility-administered medications on file prior to visit.    Review of Systems Constitutional: Negative for other unusual diaphoresis, sweats, appetite or weight changes HENT: Negative for other worsening hearing loss, ear pain, facial swelling, mouth sores or neck stiffness.   Eyes: Negative for other worsening pain, redness or other visual disturbance.  Respiratory: Negative for other stridor or swelling Cardiovascular: Negative for other palpitations or other chest pain  Gastrointestinal: Negative for worsening diarrhea or loose stools, blood in stool, distention or other pain Genitourinary: Negative for hematuria, flank pain or other change in urine volume.  Musculoskeletal: Negative for myalgias or other joint swelling.  Skin: Negative for other color change, or other wound or worsening drainage.  Neurological: Negative for other syncope or numbness. Hematological: Negative for other adenopathy or swelling Psychiatric/Behavioral: Negative for hallucinations, other worsening agitation, SI, self-injury, or new decreased concentration All other system neg per pt    Objective:  Physical Exam BP (!) 160/110 (BP Location: Left Arm, Patient Position: Sitting, Cuff Size: Normal)    Pulse (!) 108    Temp 98.1 F (36.7 C) (Oral)    Ht 5\' 4"  (1.626 m)    Wt 188 lb (85.3 kg)    SpO2 97%    BMI 32.27 kg/m  VS noted,  Constitutional: Pt is oriented to person, place, and time. Appears well-developed and  well-nourished, in no significant distress and comfortable Head: Normocephalic and atraumatic  Eyes: Conjunctivae and EOM are normal. Pupils are equal, round, and reactive to light Right Ear: External ear normal without discharge Left Ear: External ear normal without discharge Nose: Nose without discharge or deformity Mouth/Throat: Oropharynx is without other ulcerations and moist  Neck: Normal range of motion. Neck supple. No JVD present. No tracheal deviation present or significant neck LA or mass Cardiovascular: Normal rate, regular rhythm, normal heart sounds and intact distal pulses.   Pulmonary/Chest: WOB normal and breath sounds without rales or wheezing  Abdominal: Soft. Bowel sounds are normal. NT. No HSM  Musculoskeletal: Normal range of motion. Exhibits no edema Lymphadenopathy: Has no other cervical adenopathy.  Neurological: Pt is alert and oriented to person, place, and time. Pt has normal reflexes. No cranial nerve deficit. Motor grossly intact, Gait intact Skin: Skin is warm and dry. No rash noted or new ulcerations Psychiatric:  Has nervous mood and affect. Behavior is normal without agitation No other exam fidnings Lab Results  Component Value Date   WBC 8.0 11/14/2018   HGB 14.4 11/14/2018   HCT 43.8 11/14/2018   PLT 230.0 11/14/2018   GLUCOSE 116 (H) 11/14/2018   CHOL 148 11/14/2018   TRIG 182.0 (H) 11/14/2018   HDL 49.90 11/14/2018   LDLDIRECT 99.9 03/07/2007   LDLCALC 61 11/14/2018   ALT 9 11/14/2018   AST 14 11/14/2018   NA 138 11/14/2018   K 4.3 11/14/2018   CL 105 11/14/2018   CREATININE 0.99 11/14/2018   BUN 12 11/14/2018   CO2 27 11/14/2018   TSH 1.16 11/14/2018   HGBA1C 6.8 (H) 11/14/2018   MICROALBUR 7.6 (H) 05/10/2017      Assessment & Plan:

## 2018-11-16 NOTE — Assessment & Plan Note (Signed)

## 2018-11-22 ENCOUNTER — Other Ambulatory Visit: Payer: Self-pay | Admitting: Internal Medicine

## 2018-11-28 ENCOUNTER — Ambulatory Visit (INDEPENDENT_AMBULATORY_CARE_PROVIDER_SITE_OTHER)
Admission: RE | Admit: 2018-11-28 | Discharge: 2018-11-28 | Disposition: A | Discharge status: Home / Self Care | Payer: Medicare Other | Source: Ambulatory Visit | Attending: Acute Care | Admitting: Acute Care

## 2018-11-28 ENCOUNTER — Other Ambulatory Visit: Payer: Self-pay

## 2018-11-28 DIAGNOSIS — Z122 Encounter for screening for malignant neoplasm of respiratory organs: Secondary | ICD-10-CM

## 2018-11-28 DIAGNOSIS — F17210 Nicotine dependence, cigarettes, uncomplicated: Secondary | ICD-10-CM | POA: Diagnosis not present

## 2018-12-01 ENCOUNTER — Other Ambulatory Visit: Payer: Self-pay | Admitting: *Deleted

## 2018-12-01 DIAGNOSIS — F17210 Nicotine dependence, cigarettes, uncomplicated: Secondary | ICD-10-CM

## 2018-12-01 DIAGNOSIS — Z122 Encounter for screening for malignant neoplasm of respiratory organs: Secondary | ICD-10-CM

## 2018-12-01 DIAGNOSIS — Z87891 Personal history of nicotine dependence: Secondary | ICD-10-CM

## 2019-03-19 ENCOUNTER — Telehealth: Payer: Self-pay

## 2019-03-19 NOTE — Telephone Encounter (Signed)
Pt called wants to be seen in office. Pt does not want to do virtaul stating not comfortable with that.  Pt ha sinus congestion with post nasal drip and productive cough. No fever.  Please advise. Given appt 03/21/19 @ 11:00.

## 2019-03-20 NOTE — Telephone Encounter (Signed)
FYI... pt has not been around anyone who had the covid.Marland KitchenJohny Oconnor

## 2019-03-21 ENCOUNTER — Ambulatory Visit (INDEPENDENT_AMBULATORY_CARE_PROVIDER_SITE_OTHER): Payer: Managed Care, Other (non HMO) | Admitting: Internal Medicine

## 2019-03-21 DIAGNOSIS — J329 Chronic sinusitis, unspecified: Secondary | ICD-10-CM

## 2019-03-21 DIAGNOSIS — E119 Type 2 diabetes mellitus without complications: Secondary | ICD-10-CM | POA: Diagnosis not present

## 2019-03-21 MED ORDER — AMOXICILLIN 500 MG PO CAPS: 1000.0000 mg | ORAL_CAPSULE | Freq: Two times a day (BID) | ORAL | 0 refills | Status: DC

## 2019-03-21 MED ORDER — HYDROCODONE-HOMATROPINE 5-1.5 MG/5ML PO SYRP: 5.0000 mL | ORAL_SOLUTION | Freq: Four times a day (QID) | ORAL | 0 refills | Status: AC | PRN

## 2019-03-21 NOTE — Progress Notes (Signed)
Patient ID: Brittany Oconnor, female   DOB: 02/13/1946, 73 y.o.   MRN: [id number]  Phone visit  Cumulative time during 7-day interval 14 min, there was not an associated office visit for this concern within a 7 day period.  Verbal consent for services obtained from patient prior to services given.  Names of all persons present for services: Cathlean Cower, MD, patient  Chief complaint: sinus symptoms  History, background, results pertinent:   Here with 2-3 days acute onset fever, facial pain, pressure, headache, general weakness and malaise, and greenish d/c, with mild ST and non prod cough that keeps her up at night, but pt denies chest pain, wheezing, increased sob or doe, orthopnea, PND, increased LE swelling, palpitations, dizziness or syncope. Pt denies new neurological symptoms such as new headache, or facial or extremity weakness or numbness   Pt denies polydipsia, polyuria,  Past Medical History:  Diagnosis Date  . ALLERGIC RHINITIS 11/24/2006  . Allergy   . ANXIETY 07/10/2008  . DIABETES MELLITUS, TYPE II 11/24/2006  . GERD (gastroesophageal reflux disease)   . History of shingles   . HYPERLIPIDEMIA 11/24/2006   No results found for this or any previous visit (from the past 48 hour(s)). Current Outpatient Medications on File Prior to Visit  Medication Sig Dispense Refill  . aspirin 81 MG tablet Take 81 mg by mouth daily.    . metFORMIN (GLUCOPHAGE-XR) 500 MG 24 hr tablet TAKE 3 TABLETS EVERY MORNING 270 tablet 3  . simvastatin (ZOCOR) 20 MG tablet TAKE 1 TABLET BY MOUTH EVERY DAY IN THE EVENING 90 tablet 3   No current facility-administered medications on file prior to visit.   Lab Results  Component Value Date   WBC 8.0 11/14/2018   HGB 14.4 11/14/2018   HCT 43.8 11/14/2018   PLT 230.0 11/14/2018   GLUCOSE 116 (H) 11/14/2018   CHOL 148 11/14/2018   TRIG 182.0 (H) 11/14/2018   HDL 49.90 11/14/2018   LDLDIRECT 99.9 03/07/2007   LDLCALC 61 11/14/2018   ALT 9 11/14/2018   AST  14 11/14/2018   NA 138 11/14/2018   K 4.3 11/14/2018   CL 105 11/14/2018   CREATININE 0.99 11/14/2018   BUN 12 11/14/2018   CO2 27 11/14/2018   TSH 1.16 11/14/2018   HGBA1C 6.8 (H) 11/14/2018   MICROALBUR 7.6 (H) 05/10/2017   A/P/next steps:   1)  sinusitis - Mild to mod, for antibx course,  to f/u any worsening symptoms or concerns  2) DM - stable overall by history and exam, recent data reviewed with pt, and pt to continue medical treatment as before,  to f/u any worsening symptoms or concerns  Cathlean Cower MD

## 2019-03-21 NOTE — Patient Instructions (Signed)
Please take all new medication as prescribed  Please continue all other medications as before, and refills have been done if requested.  Please have the pharmacy call with any other refills you may need.  Please continue your efforts at being more active, low cholesterol diet, and weight control.  Please keep your appointments with your specialists as you may have planned    

## 2019-03-30 ENCOUNTER — Encounter: Payer: Self-pay | Admitting: Internal Medicine

## 2019-03-30 DIAGNOSIS — J329 Chronic sinusitis, unspecified: Secondary | ICD-10-CM | POA: Insufficient documentation

## 2019-03-30 NOTE — Assessment & Plan Note (Signed)
See notes

## 2019-05-04 ENCOUNTER — Telehealth: Payer: Self-pay | Admitting: Internal Medicine

## 2019-05-04 NOTE — Telephone Encounter (Signed)
Patient states she is scheduled for a COVID vac tomorrow.  Would like to know if she can take the vac being allergic to shellfish.

## 2019-05-08 NOTE — Telephone Encounter (Signed)
No, that should be ok, thanks

## 2019-05-08 NOTE — Telephone Encounter (Signed)
Pt stated that she had the vaccination on Saturday and has had no issue following.

## 2019-05-21 ENCOUNTER — Other Ambulatory Visit (INDEPENDENT_AMBULATORY_CARE_PROVIDER_SITE_OTHER): Payer: Medicare PPO

## 2019-05-21 DIAGNOSIS — E611 Iron deficiency: Secondary | ICD-10-CM | POA: Diagnosis not present

## 2019-05-21 DIAGNOSIS — E538 Deficiency of other specified B group vitamins: Secondary | ICD-10-CM

## 2019-05-21 DIAGNOSIS — E119 Type 2 diabetes mellitus without complications: Secondary | ICD-10-CM

## 2019-05-21 DIAGNOSIS — E559 Vitamin D deficiency, unspecified: Secondary | ICD-10-CM

## 2019-05-21 LAB — LIPID PANEL
Cholesterol: 151 mg/dL (ref 0–200)
HDL: 60.1 mg/dL (ref >39.00)
LDL Cholesterol: 63 mg/dL (ref 0–99)
NonHDL: 90.75
Total CHOL/HDL Ratio: 3
Triglycerides: 137 mg/dL (ref 0.0–149.0)
VLDL: 27.4 mg/dL (ref 0.0–40.0)

## 2019-05-21 LAB — BASIC METABOLIC PANEL
BUN: 14 mg/dL (ref 6–23)
CO2: 27 mEq/L (ref 19–32)
Calcium: 10 mg/dL (ref 8.4–10.5)
Chloride: 102 mEq/L (ref 96–112)
Creatinine, Ser: 1.01 mg/dL (ref 0.40–1.20)
GFR: 64.65 mL/min (ref >60.00)
Glucose, Bld: 122 mg/dL — ABNORMAL HIGH (ref 70–99)
Potassium: 4.1 mEq/L (ref 3.5–5.1)
Sodium: 141 mEq/L (ref 135–145)

## 2019-05-21 LAB — HEPATIC FUNCTION PANEL
ALT: 11 U/L (ref 0–35)
AST: 15 U/L (ref 0–37)
Albumin: 4.4 g/dL (ref 3.5–5.2)
Alkaline Phosphatase: 62 U/L (ref 39–117)
Bilirubin, Direct: 0.1 mg/dL (ref 0.0–0.3)
Total Bilirubin: 0.4 mg/dL (ref 0.2–1.2)
Total Protein: 7.4 g/dL (ref 6.0–8.3)

## 2019-05-21 LAB — VITAMIN B12: Vitamin B-12: 252 pg/mL (ref 211–911)

## 2019-05-21 LAB — HEMOGLOBIN A1C: Hgb A1c MFr Bld: 6.8 % — ABNORMAL HIGH (ref 4.6–6.5)

## 2019-05-21 LAB — VITAMIN D 25 HYDROXY (VIT D DEFICIENCY, FRACTURES): VITD: 23.35 ng/mL — ABNORMAL LOW (ref 30.00–100.00)

## 2019-05-21 LAB — IBC PANEL
Iron: 88 ug/dL (ref 42–145)
Saturation Ratios: 23.9 % (ref 20.0–50.0)
Transferrin: 263 mg/dL (ref 212.0–360.0)

## 2019-05-23 ENCOUNTER — Other Ambulatory Visit: Payer: Self-pay

## 2019-05-23 ENCOUNTER — Ambulatory Visit (INDEPENDENT_AMBULATORY_CARE_PROVIDER_SITE_OTHER): Payer: Medicare PPO | Admitting: Internal Medicine

## 2019-05-23 ENCOUNTER — Encounter: Payer: Self-pay | Admitting: Internal Medicine

## 2019-05-23 VITALS — BP 152/88 | HR 104 | Temp 97.8°F | Ht 64.0 in | Wt 186.0 lb

## 2019-05-23 DIAGNOSIS — R030 Elevated blood-pressure reading, without diagnosis of hypertension: Secondary | ICD-10-CM | POA: Diagnosis not present

## 2019-05-23 DIAGNOSIS — J209 Acute bronchitis, unspecified: Secondary | ICD-10-CM | POA: Diagnosis not present

## 2019-05-23 DIAGNOSIS — J440 Chronic obstructive pulmonary disease with acute lower respiratory infection: Secondary | ICD-10-CM

## 2019-05-23 DIAGNOSIS — E119 Type 2 diabetes mellitus without complications: Secondary | ICD-10-CM | POA: Diagnosis not present

## 2019-05-23 DIAGNOSIS — E559 Vitamin D deficiency, unspecified: Secondary | ICD-10-CM | POA: Diagnosis not present

## 2019-05-23 DIAGNOSIS — E785 Hyperlipidemia, unspecified: Secondary | ICD-10-CM | POA: Diagnosis not present

## 2019-05-23 DIAGNOSIS — Z0000 Encounter for general adult medical examination without abnormal findings: Secondary | ICD-10-CM

## 2019-05-23 MED ORDER — ONETOUCH VERIO VI STRP: ORAL_STRIP | 12 refills | Status: DC

## 2019-05-23 MED ORDER — ONETOUCH VERIO W/DEVICE KIT: 1.0000 | PACK | Freq: Every day | 0 refills | Status: DC

## 2019-05-23 MED ORDER — LANCETS MISC: 1.0000 | Freq: Every day | 3 refills | Status: AC

## 2019-05-23 MED ORDER — VITAMIN D (ERGOCALCIFEROL) 1.25 MG (50000 UNIT) PO CAPS: 50000.0000 [IU] | ORAL_CAPSULE | ORAL | 0 refills | Status: DC

## 2019-05-23 NOTE — Assessment & Plan Note (Addendum)
stable overall by history and exam, recent data reviewed with pt, and pt to continue medical treatment as before,  to f/u any worsening symptoms or concerns, rx for new meter and supplies sent  I spent 31 minutes in preparing to see the patient by review of recent labs, imaging and procedures, obtaining and reviewing separately obtained history, communicating with the patient and family or caregiver, ordering medications, tests or procedures, and documenting clinical information in the EHR including the differential Dx, treatment, and any further evaluation and other management of DM, elev BP, HLD, vit d defiec, copd

## 2019-05-23 NOTE — Patient Instructions (Addendum)
Please take Vitamin D 37858 units weekly for 12 weeks, then plan to change to OTC Vitamin D3 at 2000 units per day, indefinitely.  Please take all new medication as prescribed - the OneTouch meter and strips (sent to the pharmacy)  Please continue all other medications as before, and refills have been done if requested.  Please have the pharmacy call with any other refills you may need.  Please continue your efforts at being more active, low cholesterol diet, and weight control.  You are otherwise up to date with prevention measures today.  Please keep your appointments with your specialists as you may have planned  Please make an Appointment to return in 6 months, or sooner if needed, also with Lab Appointment for testing done 3-5 days before at the FIRST FLOOR Lab (so this is for TWO appointments - please see the scheduling desk as you leave)

## 2019-05-23 NOTE — Assessment & Plan Note (Signed)
stable overall by history and exam, recent data reviewed with pt, and pt to continue medical treatment as before,  to f/u any worsening symptoms or concerns, declines BP med start

## 2019-05-23 NOTE — Progress Notes (Signed)
Subjective:    Patient ID: Brittany Oconnor, female    DOB: 1945-10-02, 74 y.o.   MRN: [id number]  HPI Here to f/u; overall doing ok,  Pt denies chest pain, increasing sob or doe, wheezing, orthopnea, PND, increased LE swelling, palpitations, dizziness or syncope.  Pt denies new neurological symptoms such as new headache, or facial or extremity weakness or numbness.  Pt denies polydipsia, polyuria, or low sugar episode.  Pt states overall good compliance with meds, mostly trying to follow appropriate diet, with wt overall stable,  but little exercise however.  S/p covid vaccine x 1, last later this wk.  BP at home < 140/90.  Pt need right cataract out soon per Dr Dione Booze.   Past Medical History:  Diagnosis Date  . ALLERGIC RHINITIS 11/24/2006  . Allergy   . ANXIETY 07/10/2008  . DIABETES MELLITUS, TYPE II 11/24/2006  . GERD (gastroesophageal reflux disease)   . History of shingles   . HYPERLIPIDEMIA 11/24/2006   Past Surgical History:  Procedure Laterality Date  . BREAST BIOPSY Right    benign  . MOUTH SURGERY      reports that she has been smoking cigarettes. She has a 47.00 pack-year smoking history. She has never used smokeless tobacco. She reports that she does not drink alcohol or use drugs. family history includes Appendicitis in her father; Colon cancer (age of onset: 82) in her sister; Colon cancer (age of onset: 32) in her sister; Heart disease in her sister; Hypertension in her father; Stroke in her mother. Allergies  Allergen Reactions  . Doxycycline    Current Outpatient Medications on File Prior to Visit  Medication Sig Dispense Refill  . aspirin 81 MG tablet Take 81 mg by mouth daily.    . metFORMIN (GLUCOPHAGE-XR) 500 MG 24 hr tablet TAKE 3 TABLETS EVERY MORNING 270 tablet 3  . simvastatin (ZOCOR) 20 MG tablet TAKE 1 TABLET BY MOUTH EVERY DAY IN THE EVENING 90 tablet 3   No current facility-administered medications on file prior to visit.   Review of Systems All otherwise  neg per pt     Objective:   Physical Exam BP (!) 152/88 (BP Location: Left Arm, Patient Position: Sitting)   Pulse (!) 104   Temp 97.8 F (36.6 C) (Oral)   Ht 5\' 4"  (1.626 m)   Wt 186 lb (84.4 kg)   SpO2 98%   BMI 31.93 kg/m  VS noted,  Constitutional: Pt appears in NAD HENT: Head: NCAT.  Right Ear: External ear normal.  Left Ear: External ear normal.  Eyes: . Pupils are equal, round, and reactive to light. Conjunctivae and EOM are normal Nose: without d/c or deformity Neck: Neck supple. Gross normal ROM Cardiovascular: Normal rate and regular rhythm.   Pulmonary/Chest: Effort normal and breath sounds without rales or wheezing.  Abd:  Soft, NT, ND, + BS, no organomegaly Neurological: Pt is alert. At baseline orientation, motor grossly intact Skin: Skin is warm. No rashes, other new lesions, no LE edema Psychiatric: Pt behavior is normal without agitation  All otherwise neg per pt Lab Results  Component Value Date   WBC 8.0 11/14/2018   HGB 14.4 11/14/2018   HCT 43.8 11/14/2018   PLT 230.0 11/14/2018   GLUCOSE 122 (H) 05/21/2019   CHOL 151 05/21/2019   TRIG 137.0 05/21/2019   HDL 60.10 05/21/2019   LDLDIRECT 99.9 03/07/2007   LDLCALC 63 05/21/2019   ALT 11 05/21/2019   AST 15 05/21/2019   NA  141 05/21/2019   K 4.1 05/21/2019   CL 102 05/21/2019   CREATININE 1.01 05/21/2019   BUN 14 05/21/2019   CO2 27 05/21/2019   TSH 1.16 11/14/2018   HGBA1C 6.8 (H) 05/21/2019   MICROALBUR 7.6 (H) 05/10/2017         Assessment & Plan:

## 2019-05-23 NOTE — Assessment & Plan Note (Signed)
stable overall by history and exam, recent data reviewed with pt, and pt to continue medical treatment as before,  to f/u any worsening symptoms or concerns  

## 2019-05-23 NOTE — Assessment & Plan Note (Signed)
For replacement 

## 2019-05-24 ENCOUNTER — Other Ambulatory Visit: Payer: Self-pay | Admitting: Internal Medicine

## 2019-05-24 MED ORDER — ACCU-CHEK AVIVA PLUS VI STRP: ORAL_STRIP | 12 refills | Status: DC

## 2019-05-24 MED ORDER — ACCU-CHEK AVIVA DEVI: 0 refills | Status: DC

## 2019-05-24 MED ORDER — ACCU-CHEK AVIVA DEVI: 0 refills | Status: AC

## 2019-05-24 MED ORDER — ACCU-CHEK GUIDE VI STRP: 1.0000 | ORAL_STRIP | Freq: Every day | 3 refills | Status: AC

## 2019-07-14 ENCOUNTER — Ambulatory Visit (HOSPITAL_COMMUNITY)
Admission: EM | Admit: 2019-07-14 | Discharge: 2019-07-14 | Disposition: A | Discharge status: Home / Self Care | Payer: Medicare PPO | Attending: Emergency Medicine | Admitting: Emergency Medicine

## 2019-07-14 ENCOUNTER — Ambulatory Visit (INDEPENDENT_AMBULATORY_CARE_PROVIDER_SITE_OTHER): Payer: Medicare PPO

## 2019-07-14 ENCOUNTER — Other Ambulatory Visit: Payer: Self-pay

## 2019-07-14 ENCOUNTER — Encounter (HOSPITAL_COMMUNITY): Payer: Self-pay

## 2019-07-14 DIAGNOSIS — S92514A Nondisplaced fracture of proximal phalanx of right lesser toe(s), initial encounter for closed fracture: Secondary | ICD-10-CM | POA: Diagnosis not present

## 2019-07-14 NOTE — Discharge Instructions (Signed)
Fracture of the fourth toe-should gradually heal over the next 4 to 6 weeks Please keep buddy taped Elevate and ice when at home and resting May use postop shoe to help with pain  Please follow-up if having any complications

## 2019-07-14 NOTE — ED Provider Notes (Signed)
[facility]    CSN: [id number] Arrival date & time: 07/14/19  1548      History   Chief Complaint Chief Complaint  Patient presents with  . Toe Injury    HPI Brittany Oconnor is a 74 y.o. female history of DM type II, GERD, presenting today for evaluation of toe injury.  Patient notes that earlier this morning she stubbed her toe on a set of steps.  Since she has had pain and swelling.  Reports feels similar to prior toe fractures in her fifth toe.  Denies pain extending into the foot or ankle.  HPI  Past Medical History:  Diagnosis Date  . ALLERGIC RHINITIS 11/24/2006  . Allergy   . ANXIETY 07/10/2008  . DIABETES MELLITUS, TYPE II 11/24/2006  . GERD (gastroesophageal reflux disease)   . History of shingles   . HYPERLIPIDEMIA 11/24/2006    Patient Active Problem List   Diagnosis Date Noted  . Vitamin D deficiency 05/23/2019  . Sinusitis 03/30/2019  . Viral URI with cough 03/23/2018  . Dysphagia 05/10/2017  . COPD exacerbation (HCC) 05/10/2015  . Cough 05/10/2015  . Chest pain 05/10/2015  . Skin tag 05/17/2014  . Right carpal tunnel syndrome 10/13/2013  . COPD (chronic obstructive pulmonary disease) with acute bronchitis (HCC) 10/13/2013  . Obesity (BMI 30-39.9) 09/08/2013  . Smoker 04/04/2013  . Left leg swelling 12/13/2012  . Family history of colon cancer 06/09/2011  . Preventative health care 04/29/2011  . MENOPAUSAL DISORDER 03/18/2010  . Anxiety state 07/10/2008  . ELEVATED BLOOD PRESSURE WITHOUT DIAGNOSIS OF HYPERTENSION 05/24/2007  . KNEE PAIN, LEFT 03/07/2007  . Diabetes (HCC) 11/24/2006  . Hyperlipidemia 11/24/2006  . Allergic rhinitis 11/24/2006    Past Surgical History:  Procedure Laterality Date  . BREAST BIOPSY Right    benign  . MOUTH SURGERY      OB History   No obstetric history on file.      Home Medications    Prior to Admission medications   Medication Sig Start Date End Date Taking? Authorizing Provider  aspirin 81 MG  tablet Take 81 mg by mouth daily.    [provider]  Blood Glucose Monitoring Suppl (ACCU-CHEK AVIVA) device Use as instructed once daily E11.9 05/24/19 05/23/20  Corwin Levins, MD  glucose blood (ACCU-CHEK GUIDE) test strip 1 each by Other route daily. Use as instructed 05/24/19 09/01/19  Corwin Levins, MD  Lancets MISC Apply 1 Device topically daily. E11.9 05/23/19   Corwin Levins, MD  metFORMIN (GLUCOPHAGE-XR) 500 MG 24 hr tablet TAKE 3 TABLETS EVERY MORNING 11/22/18   Corwin Levins, MD  simvastatin (ZOCOR) 20 MG tablet TAKE 1 TABLET BY MOUTH EVERY DAY IN THE EVENING 10/02/18   Corwin Levins, MD  Vitamin D, Ergocalciferol, (DRISDOL) 1.25 MG (50000 UNIT) CAPS capsule Take 1 capsule (50,000 Units total) by mouth every 7 (seven) days. 05/23/19   Corwin Levins, MD    Family History Family History  Problem Relation Age of Onset  . Colon cancer Sister 56       deceased   . Heart disease Sister        died of heart attack  . Colon cancer Sister 23       alive  . Hypertension Father   . Appendicitis Father        rupture  . Stroke Mother        stroke 59;died age 53    Social History Social  History   Tobacco Use  . Smoking status: Current Every Day Smoker    Packs/day: 1.00    Years: 47.00    Pack years: 47.00    Types: Cigarettes  . Smokeless tobacco: Never Used  . Tobacco comment: 1 ppd  Substance Use Topics  . Alcohol use: No    Alcohol/week: 0.0 standard drinks  . Drug use: No     Allergies   Doxycycline   Review of Systems Review of Systems  Constitutional: Negative for fatigue and fever.  Eyes: Negative for visual disturbance.  Respiratory: Negative for shortness of breath.   Cardiovascular: Negative for chest pain.  Gastrointestinal: Negative for abdominal pain, nausea and vomiting.  Musculoskeletal: Positive for arthralgias, gait problem and joint swelling.  Skin: Positive for color change. Negative for rash and wound.  Neurological: Negative for dizziness,  weakness, light-headedness and headaches.     Physical Exam Triage Vital Signs ED Triage Vitals  Enc Vitals Group     BP 07/14/19 1637 (!) 161/95     Pulse Rate 07/14/19 1637 99     Resp 07/14/19 1637 14     Temp 07/14/19 1637 98 F (36.7 C)     Temp Source 07/14/19 1637 Oral     SpO2 07/14/19 1637 100 %     Weight --      Height --      Head Circumference --      Peak Flow --      Pain Score 07/14/19 1636 6     Pain Loc --      Pain Edu? --      Excl. in [place]? --    No data found.  Updated Vital Signs BP (!) 161/95 (BP Location: Right Arm)   Pulse 99   Temp 98 F (36.7 C) (Oral)   Resp 14   SpO2 100%   Visual Acuity Right Eye Distance:   Left Eye Distance:   Bilateral Distance:    Right Eye Near:   Left Eye Near:    Bilateral Near:     Physical Exam Vitals and nursing note reviewed.  Constitutional:      Appearance: She is well-developed.     Comments: No acute distress  HENT:     Head: Normocephalic and atraumatic.     Nose: Nose normal.  Eyes:     Conjunctiva/sclera: Conjunctivae normal.  Cardiovascular:     Rate and Rhythm: Normal rate.  Pulmonary:     Effort: Pulmonary effort is normal. No respiratory distress.  Abdominal:     General: There is no distension.  Musculoskeletal:        General: Normal range of motion.     Cervical back: Neck supple.     Comments: Right foot: Fourth toe with swelling and mild bruising noted proximally.  Tender to palpation, nontender to palpation throughout metatarsals, dorsalis pedis 2+  Skin:    General: Skin is warm and dry.  Neurological:     Mental Status: She is alert and oriented to person, place, and time.      UC Treatments / Results  Labs (all labs ordered are listed, but only abnormal results are displayed) Labs Reviewed - No data to display  EKG   Radiology DG Foot Complete Right  Result Date: 07/14/2019 CLINICAL DATA:  74 year old female with pain in the right fourth toe. EXAM: RIGHT FOOT  COMPLETE - 3+ VIEW COMPARISON:  None. FINDINGS: There is a nondisplaced oblique fracture of the midportion of  the proximal phalanx of the fourth toe. No dislocation. No significant arthritic changes. Mild soft tissue swelling of the fourth toe. No radiopaque foreign object or soft tissue gas. IMPRESSION: Nondisplaced oblique fracture of the proximal phalanx of the fourth toe. Electronically Signed   By: Elgie Collard M.D.   On: 07/14/2019 17:02    Procedures Procedures (including critical care time)  Medications Ordered in UC Medications - No data to display  Initial Impression / Assessment and Plan / UC Course  I have reviewed the triage vital signs and the nursing notes.  Pertinent labs & imaging results that were available during my care of the patient were reviewed by me and considered in my medical decision making (see chart for details).     Nondisplaced proximal phalanx fracture of fourth toe on right foot.  Will buddy tape and place in postop shoe.  Anti-inflammatories for pain/swelling, ice and elevate.  Would expect gradual healing over the next 4 to 6 weeks.  Follow-up for any complications.  Discussed strict return precautions. Patient verbalized understanding and is agreeable with plan.  Final Clinical Impressions(s) / UC Diagnoses   Final diagnoses:  Closed nondisplaced fracture of proximal phalanx of lesser toe of right foot, initial encounter     Discharge Instructions     Fracture of the fourth toe-should gradually heal over the next 4 to 6 weeks Please keep buddy taped Elevate and ice when at home and resting May use postop shoe to help with pain  Please follow-up if having any complications   ED Prescriptions    None     PDMP not reviewed this encounter.   Lew Dawes, [place] 07/14/19 1807

## 2019-07-14 NOTE — ED Triage Notes (Signed)
Patient reports she stubbed her right fourth toe this morning.

## 2019-08-11 ENCOUNTER — Other Ambulatory Visit: Payer: Self-pay | Admitting: Internal Medicine

## 2019-08-11 NOTE — Telephone Encounter (Signed)
Please change to OTC Vitamin D3 at 2000 units per day, indefinitely.  

## 2019-09-30 ENCOUNTER — Other Ambulatory Visit: Payer: Self-pay | Admitting: Internal Medicine

## 2019-09-30 NOTE — Telephone Encounter (Signed)
Please refill as per office routine med refill policy (all routine meds refilled for 3 mo or monthly per pt preference up to one year from last visit, then month to month grace period for 3 mo, then further med refills will have to be denied)  

## 2019-10-23 ENCOUNTER — Other Ambulatory Visit: Payer: Self-pay | Admitting: Internal Medicine

## 2019-10-23 DIAGNOSIS — Z1231 Encounter for screening mammogram for malignant neoplasm of breast: Secondary | ICD-10-CM

## 2019-11-14 ENCOUNTER — Other Ambulatory Visit: Payer: 59

## 2019-11-20 ENCOUNTER — Ambulatory Visit: Payer: 59 | Admitting: Internal Medicine

## 2019-11-21 ENCOUNTER — Ambulatory Visit
Admission: RE | Admit: 2019-11-21 | Discharge: 2019-11-21 | Disposition: A | Discharge status: Home / Self Care | Payer: Medicare PPO | Source: Ambulatory Visit | Attending: Internal Medicine | Admitting: Internal Medicine

## 2019-11-21 ENCOUNTER — Other Ambulatory Visit (INDEPENDENT_AMBULATORY_CARE_PROVIDER_SITE_OTHER): Payer: Medicare PPO

## 2019-11-21 ENCOUNTER — Other Ambulatory Visit: Payer: Self-pay

## 2019-11-21 DIAGNOSIS — Z0000 Encounter for general adult medical examination without abnormal findings: Secondary | ICD-10-CM

## 2019-11-21 DIAGNOSIS — Z1231 Encounter for screening mammogram for malignant neoplasm of breast: Secondary | ICD-10-CM

## 2019-11-21 DIAGNOSIS — E559 Vitamin D deficiency, unspecified: Secondary | ICD-10-CM

## 2019-11-21 DIAGNOSIS — E119 Type 2 diabetes mellitus without complications: Secondary | ICD-10-CM

## 2019-11-21 LAB — CBC WITH DIFFERENTIAL/PLATELET
Basophils Absolute: 0 10*3/uL (ref 0.0–0.1)
Basophils Relative: 0.3 % (ref 0.0–3.0)
Eosinophils Absolute: 0 10*3/uL (ref 0.0–0.7)
Eosinophils Relative: 0.4 % (ref 0.0–5.0)
HCT: 43.1 % (ref 36.0–46.0)
Hemoglobin: 14.2 g/dL (ref 12.0–15.0)
Lymphocytes Relative: 42.6 % (ref 12.0–46.0)
Lymphs Abs: 3.2 10*3/uL (ref 0.7–4.0)
MCHC: 33 g/dL (ref 30.0–36.0)
MCV: 84.6 fl (ref 78.0–100.0)
Monocytes Absolute: 0.6 10*3/uL (ref 0.1–1.0)
Monocytes Relative: 8.4 % (ref 3.0–12.0)
Neutro Abs: 3.6 10*3/uL (ref 1.4–7.7)
Neutrophils Relative %: 48.3 % (ref 43.0–77.0)
Platelets: 232 10*3/uL (ref 150.0–400.0)
RBC: 5.09 Mil/uL (ref 3.87–5.11)
RDW: 14.3 % (ref 11.5–15.5)
WBC: 7.4 10*3/uL (ref 4.0–10.5)

## 2019-11-21 LAB — BASIC METABOLIC PANEL
BUN: 15 mg/dL (ref 6–23)
CO2: 31 mEq/L (ref 19–32)
Calcium: 9.8 mg/dL (ref 8.4–10.5)
Chloride: 102 mEq/L (ref 96–112)
Creatinine, Ser: 1.02 mg/dL (ref 0.40–1.20)
GFR: 63.83 mL/min (ref >60.00)
Glucose, Bld: 115 mg/dL — ABNORMAL HIGH (ref 70–99)
Potassium: 4.1 mEq/L (ref 3.5–5.1)
Sodium: 138 mEq/L (ref 135–145)

## 2019-11-21 LAB — HEPATIC FUNCTION PANEL
ALT: 9 U/L (ref 0–35)
AST: 15 U/L (ref 0–37)
Albumin: 4.3 g/dL (ref 3.5–5.2)
Alkaline Phosphatase: 51 U/L (ref 39–117)
Bilirubin, Direct: 0.1 mg/dL (ref 0.0–0.3)
Total Bilirubin: 0.4 mg/dL (ref 0.2–1.2)
Total Protein: 7.4 g/dL (ref 6.0–8.3)

## 2019-11-21 LAB — URINALYSIS, ROUTINE W REFLEX MICROSCOPIC
Bilirubin Urine: NEGATIVE
Hgb urine dipstick: NEGATIVE
Ketones, ur: NEGATIVE
Nitrite: NEGATIVE
Specific Gravity, Urine: 1.02 (ref 1.000–1.030)
Total Protein, Urine: 30 — AB
Urine Glucose: NEGATIVE
Urobilinogen, UA: 0.2 (ref 0.0–1.0)
pH: 6 (ref 5.0–8.0)

## 2019-11-21 LAB — MICROALBUMIN / CREATININE URINE RATIO
Creatinine,U: 166.4 mg/dL
Microalb Creat Ratio: 13.2 mg/g (ref 0.0–30.0)
Microalb, Ur: 21.9 mg/dL — ABNORMAL HIGH (ref 0.0–1.9)

## 2019-11-21 LAB — TSH: TSH: 0.95 u[IU]/mL (ref 0.35–4.50)

## 2019-11-21 LAB — LIPID PANEL
Cholesterol: 141 mg/dL (ref 0–200)
HDL: 56.5 mg/dL (ref >39.00)
LDL Cholesterol: 66 mg/dL (ref 0–99)
NonHDL: 84.25
Total CHOL/HDL Ratio: 2
Triglycerides: 93 mg/dL (ref 0.0–149.0)
VLDL: 18.6 mg/dL (ref 0.0–40.0)

## 2019-11-21 LAB — VITAMIN D 25 HYDROXY (VIT D DEFICIENCY, FRACTURES): VITD: 36.32 ng/mL (ref 30.00–100.00)

## 2019-11-21 LAB — HEMOGLOBIN A1C: Hgb A1c MFr Bld: 6.7 % — ABNORMAL HIGH (ref 4.6–6.5)

## 2019-11-27 ENCOUNTER — Encounter: Payer: Self-pay | Admitting: Internal Medicine

## 2019-11-27 ENCOUNTER — Other Ambulatory Visit: Payer: Self-pay

## 2019-11-27 ENCOUNTER — Ambulatory Visit (INDEPENDENT_AMBULATORY_CARE_PROVIDER_SITE_OTHER): Payer: Medicare PPO | Admitting: Internal Medicine

## 2019-11-27 VITALS — BP 140/86 | HR 91 | Temp 98.0°F | Ht 64.0 in | Wt 182.0 lb

## 2019-11-27 DIAGNOSIS — E119 Type 2 diabetes mellitus without complications: Secondary | ICD-10-CM | POA: Diagnosis not present

## 2019-11-27 DIAGNOSIS — E559 Vitamin D deficiency, unspecified: Secondary | ICD-10-CM | POA: Diagnosis not present

## 2019-11-27 DIAGNOSIS — Z0000 Encounter for general adult medical examination without abnormal findings: Secondary | ICD-10-CM

## 2019-11-27 NOTE — Progress Notes (Signed)
Subjective:    Patient ID: Brittany Oconnor, female    DOB: [birth year], 74 y.o.   MRN: [id number]  HPI  Here for wellness and f/u;  Overall doing ok;  Pt denies Chest pain, worsening SOB, DOE, wheezing, orthopnea, PND, worsening LE edema, palpitations, dizziness or syncope.  Pt denies neurological change such as new headache, facial or extremity weakness.  Pt denies polydipsia, polyuria, or low sugar symptoms. Pt states overall good compliance with treatment and medications, good tolerability, and has been trying to follow appropriate diet.  Pt denies worsening depressive symptoms, suicidal ideation or panic. No fever, night sweats, wt loss, loss of appetite, or other constitutional symptoms.  Pt states good ability with ADL's, has low fall risk, home safety reviewed and adequate, no other significant changes in hearing or vision, and only occasionally active with exercise.  Has a left trigger thumb with mild discomfort only, does not want hand surgury referral for now. Past Medical History:  Diagnosis Date  . ALLERGIC RHINITIS 11/24/2006  . Allergy   . ANXIETY 07/10/2008  . DIABETES MELLITUS, TYPE II 11/24/2006  . GERD (gastroesophageal reflux disease)   . History of shingles   . HYPERLIPIDEMIA 11/24/2006   Past Surgical History:  Procedure Laterality Date  . BREAST BIOPSY Right    benign  . MOUTH SURGERY      reports that she has been smoking cigarettes. She has a 47.00 pack-year smoking history. She has never used smokeless tobacco. She reports that she does not drink alcohol and does not use drugs. family history includes Appendicitis in her father; Colon cancer (age of onset: 56) in her sister; Colon cancer (age of onset: 38) in her sister; Heart disease in her sister; Hypertension in her father; Stroke in her mother. Allergies  Allergen Reactions  . Doxycycline    Current Outpatient Medications on File Prior to Visit  Medication Sig Dispense Refill  . aspirin 81 MG tablet Take 81 mg by  mouth daily.    . Blood Glucose Monitoring Suppl (ACCU-CHEK AVIVA) device Use as instructed once daily E11.9 1 each 0  . Lancets MISC Apply 1 Device topically daily. E11.9 100 each 3  . metFORMIN (GLUCOPHAGE-XR) 500 MG 24 hr tablet TAKE 3 TABLETS EVERY MORNING 270 tablet 3  . simvastatin (ZOCOR) 20 MG tablet TAKE 1 TABLET BY MOUTH EVERY DAY IN THE EVENING 90 tablet 3   No current facility-administered medications on file prior to visit.   Review of Systems All otherwise neg per pt    Objective:   Physical Exam BP 140/86 (BP Location: Left Arm, Patient Position: Sitting, Cuff Size: Large)   Pulse 91   Temp 98 F (36.7 C) (Oral)   Ht 5\' 4"  (1.626 m)   Wt 182 lb (82.6 kg)   SpO2 96%   BMI 31.24 kg/m  VS noted,  Constitutional: Pt appears in NAD HENT: Head: NCAT.  Right Ear: External ear normal.  Left Ear: External ear normal.  Eyes: . Pupils are equal, round, and reactive to light. Conjunctivae and EOM are normal Nose: without d/c or deformity Neck: Neck supple. Gross normal ROM Cardiovascular: Normal rate and regular rhythm.   Pulmonary/Chest: Effort normal and breath sounds without rales or wheezing.  Abd:  Soft, NT, ND, + BS, no organomegaly Neurological: Pt is alert. At baseline orientation, motor grossly intact Skin: Skin is warm. No rashes, other new lesions, no LE edema Psychiatric: Pt behavior is normal without agitation  All otherwise neg per  pt Lab Results  Component Value Date   WBC 7.4 11/21/2019   HGB 14.2 11/21/2019   HCT 43.1 11/21/2019   PLT 232.0 11/21/2019   GLUCOSE 115 (H) 11/21/2019   CHOL 141 11/21/2019   TRIG 93.0 11/21/2019   HDL 56.50 11/21/2019   LDLDIRECT 99.9 03/07/2007   LDLCALC 66 11/21/2019   ALT 9 11/21/2019   AST 15 11/21/2019   NA 138 11/21/2019   K 4.1 11/21/2019   CL 102 11/21/2019   CREATININE 1.02 11/21/2019   BUN 15 11/21/2019   CO2 31 11/21/2019   TSH 0.95 11/21/2019   HGBA1C 6.7 (H) 11/21/2019   MICROALBUR 21.9 (H)  11/21/2019      Assessment & Plan:

## 2019-11-27 NOTE — Patient Instructions (Signed)

## 2019-12-02 ENCOUNTER — Encounter: Payer: Self-pay | Admitting: Internal Medicine

## 2019-12-02 NOTE — Assessment & Plan Note (Signed)
Cont oral replacement 

## 2019-12-02 NOTE — Assessment & Plan Note (Signed)
stable overall by history and exam, recent data reviewed with pt, and pt to continue medical treatment as before,  to f/u any worsening symptoms or concerns  

## 2019-12-02 NOTE — Assessment & Plan Note (Signed)

## 2019-12-20 ENCOUNTER — Ambulatory Visit (INDEPENDENT_AMBULATORY_CARE_PROVIDER_SITE_OTHER)
Admission: RE | Admit: 2019-12-20 | Discharge: 2019-12-20 | Disposition: A | Discharge status: Home / Self Care | Payer: Medicare PPO | Source: Ambulatory Visit | Attending: Acute Care | Admitting: Acute Care

## 2019-12-20 ENCOUNTER — Other Ambulatory Visit: Payer: Self-pay

## 2019-12-20 DIAGNOSIS — Z122 Encounter for screening for malignant neoplasm of respiratory organs: Secondary | ICD-10-CM

## 2019-12-20 DIAGNOSIS — F17210 Nicotine dependence, cigarettes, uncomplicated: Secondary | ICD-10-CM | POA: Diagnosis not present

## 2019-12-20 DIAGNOSIS — Z87891 Personal history of nicotine dependence: Secondary | ICD-10-CM | POA: Diagnosis not present

## 2019-12-21 ENCOUNTER — Other Ambulatory Visit: Payer: Self-pay | Admitting: *Deleted

## 2019-12-21 DIAGNOSIS — F17210 Nicotine dependence, cigarettes, uncomplicated: Secondary | ICD-10-CM

## 2019-12-21 DIAGNOSIS — Z87891 Personal history of nicotine dependence: Secondary | ICD-10-CM

## 2019-12-21 NOTE — Progress Notes (Signed)

## 2020-01-19 ENCOUNTER — Other Ambulatory Visit: Payer: Self-pay | Admitting: Internal Medicine

## 2020-01-19 NOTE — Telephone Encounter (Signed)
Please refill as per office routine med refill policy (all routine meds refilled for 3 mo or monthly per pt preference up to one year from last visit, then month to month grace period for 3 mo, then further med refills will have to be denied)  

## 2020-03-29 HISTORY — PX: EYE SURGERY: SHX253

## 2020-05-21 ENCOUNTER — Other Ambulatory Visit (INDEPENDENT_AMBULATORY_CARE_PROVIDER_SITE_OTHER): Payer: Medicare PPO

## 2020-05-21 DIAGNOSIS — E559 Vitamin D deficiency, unspecified: Secondary | ICD-10-CM

## 2020-05-21 DIAGNOSIS — E119 Type 2 diabetes mellitus without complications: Secondary | ICD-10-CM

## 2020-05-21 LAB — COMPREHENSIVE METABOLIC PANEL
ALT: 9 U/L (ref 0–35)
AST: 14 U/L (ref 0–37)
Albumin: 4 g/dL (ref 3.5–5.2)
Alkaline Phosphatase: 56 U/L (ref 39–117)
BUN: 12 mg/dL (ref 6–23)
CO2: 31 mEq/L (ref 19–32)
Calcium: 9.5 mg/dL (ref 8.4–10.5)
Chloride: 101 mEq/L (ref 96–112)
Creatinine, Ser: 1 mg/dL (ref 0.40–1.20)
GFR: 54.91 mL/min — ABNORMAL LOW (ref >60.00)
Glucose, Bld: 97 mg/dL (ref 70–99)
Potassium: 4.1 mEq/L (ref 3.5–5.1)
Sodium: 138 mEq/L (ref 135–145)
Total Bilirubin: 0.5 mg/dL (ref 0.2–1.2)
Total Protein: 7.1 g/dL (ref 6.0–8.3)

## 2020-05-21 LAB — VITAMIN D 25 HYDROXY (VIT D DEFICIENCY, FRACTURES): VITD: 47.28 ng/mL (ref 30.00–100.00)

## 2020-05-21 LAB — LIPID PANEL
Cholesterol: 132 mg/dL (ref 0–200)
HDL: 58.1 mg/dL (ref >39.00)
LDL Cholesterol: 51 mg/dL (ref 0–99)
NonHDL: 73.6
Total CHOL/HDL Ratio: 2
Triglycerides: 111 mg/dL (ref 0.0–149.0)
VLDL: 22.2 mg/dL (ref 0.0–40.0)

## 2020-05-21 LAB — HEMOGLOBIN A1C: Hgb A1c MFr Bld: 6.6 % — ABNORMAL HIGH (ref 4.6–6.5)

## 2020-05-23 ENCOUNTER — Other Ambulatory Visit: Payer: Self-pay

## 2020-05-26 ENCOUNTER — Ambulatory Visit (INDEPENDENT_AMBULATORY_CARE_PROVIDER_SITE_OTHER): Payer: Medicare PPO | Admitting: Internal Medicine

## 2020-05-26 ENCOUNTER — Other Ambulatory Visit: Payer: Self-pay

## 2020-05-26 ENCOUNTER — Encounter: Payer: Self-pay | Admitting: Internal Medicine

## 2020-05-26 VITALS — BP 158/98 | HR 109 | Temp 98.1°F | Ht 64.0 in | Wt 183.4 lb

## 2020-05-26 DIAGNOSIS — J440 Chronic obstructive pulmonary disease with acute lower respiratory infection: Secondary | ICD-10-CM | POA: Diagnosis not present

## 2020-05-26 DIAGNOSIS — Z0000 Encounter for general adult medical examination without abnormal findings: Secondary | ICD-10-CM

## 2020-05-26 DIAGNOSIS — R030 Elevated blood-pressure reading, without diagnosis of hypertension: Secondary | ICD-10-CM | POA: Diagnosis not present

## 2020-05-26 DIAGNOSIS — Z0001 Encounter for general adult medical examination with abnormal findings: Secondary | ICD-10-CM

## 2020-05-26 DIAGNOSIS — J209 Acute bronchitis, unspecified: Secondary | ICD-10-CM

## 2020-05-26 DIAGNOSIS — E7800 Pure hypercholesterolemia, unspecified: Secondary | ICD-10-CM | POA: Diagnosis not present

## 2020-05-26 DIAGNOSIS — E119 Type 2 diabetes mellitus without complications: Secondary | ICD-10-CM

## 2020-05-26 DIAGNOSIS — E559 Vitamin D deficiency, unspecified: Secondary | ICD-10-CM | POA: Diagnosis not present

## 2020-05-26 NOTE — Assessment & Plan Note (Signed)
Lab Results  Component Value Date   LDLCALC 51 05/21/2020   Stable, pt to continue current statin zocor 20  * Current Outpatient Medications (Endocrine & Metabolic):  .  metFORMIN (GLUCOPHAGE-XR) 500 MG 24 hr tablet, TAKE 3 TABLETS EVERY MORNING  Current Outpatient Medications (Cardiovascular):  .  simvastatin (ZOCOR) 20 MG tablet, TAKE 1 TABLET BY MOUTH EVERY DAY IN THE EVENING   Current Outpatient Medications (Analgesics):  .  aspirin 81 MG tablet, Take 81 mg by mouth daily.   Current Outpatient Medications (Other):  Marland Kitchen  Lancets MISC, Apply 1 Device topically daily. E11.9

## 2020-05-26 NOTE — Assessment & Plan Note (Signed)
Pt adamant BP athome < 140/90 , declines low dose arb

## 2020-05-26 NOTE — Assessment & Plan Note (Signed)
Lab Results  Component Value Date   HGBA1C 6.6 (H) 05/21/2020   Stable, pt to continue current medical treatment metformin

## 2020-05-26 NOTE — Progress Notes (Signed)
Patient ID: Brittany Oconnor, female   DOB: [birth year], 75 y.o.   MRN: [id number]         Chief Complaint:: wellness exam and Follow-up HTn, low Vit D, and smoking       HPI:  Brittany Oconnor is a 75 y.o. female here for wellness exam; plans for eye exam soon as she will need cataract surgury with Dr Dione Booze.                          Also BP marked elevated today, but adamant that BP at home < 140/90;  Tolerating vit d well,  Still smoking not ready to quit.  Pt denies chest pain, increased sob or doe, wheezing, orthopnea, PND, increased LE swelling, palpitations, dizziness or syncope.  Denies focal neuro s/s.   Pt denies polydipsia, polyuria,  Pt denies fever, wt loss, night sweats, loss of appetite, or other constitutional symptoms   Wt Readings from Last 3 Encounters:  05/26/20 183 lb 6.4 oz (83.2 kg)  11/27/19 182 lb (82.6 kg)  05/23/19 186 lb (84.4 kg)   BP Readings from Last 3 Encounters:  05/26/20 (!) 158/98  11/27/19 140/86  07/14/19 (!) 161/95   Immunization History  Administered Date(s) Administered  . Fluad Quad(high Dose 65+) 11/16/2018  . Influenza Whole 01/27/2010  . PFIZER(Purple Top)SARS-COV-2 Vaccination 05/05/2019, 05/25/2019, 12/24/2019  . Pneumococcal Conjugate-13 05/17/2014  . Pneumococcal Polysaccharide-23 07/10/2008, 05/10/2017  . Td 07/10/2008  . Tdap 11/16/2018  . Zoster 07/10/2008   There are no preventive care reminders to display for this patient.    Past Medical History:  Diagnosis Date  . ALLERGIC RHINITIS 11/24/2006  . Allergy   . ANXIETY 07/10/2008  . DIABETES MELLITUS, TYPE II 11/24/2006  . GERD (gastroesophageal reflux disease)   . History of shingles   . HYPERLIPIDEMIA 11/24/2006   Past Surgical History:  Procedure Laterality Date  . BREAST BIOPSY Right    benign  . MOUTH SURGERY      reports that she has been smoking cigarettes. She has a 47.00 pack-year smoking history. She has never used smokeless tobacco. She reports that she does not  drink alcohol and does not use drugs. family history includes Appendicitis in her father; Colon cancer (age of onset: 49) in her sister; Colon cancer (age of onset: 17) in her sister; Heart disease in her sister; Hypertension in her father; Stroke in her mother. Allergies  Allergen Reactions  . Doxycycline    Current Outpatient Medications on File Prior to Visit  Medication Sig Dispense Refill  . aspirin 81 MG tablet Take 81 mg by mouth daily.    . Lancets MISC Apply 1 Device topically daily. E11.9 100 each 3  . metFORMIN (GLUCOPHAGE-XR) 500 MG 24 hr tablet TAKE 3 TABLETS EVERY MORNING 270 tablet 3  . simvastatin (ZOCOR) 20 MG tablet TAKE 1 TABLET BY MOUTH EVERY DAY IN THE EVENING 90 tablet 3   No current facility-administered medications on file prior to visit.        ROS:  All others reviewed and negative.  Objective        PE:  BP (!) 158/98   Pulse (!) 109   Temp 98.1 F (36.7 C) (Oral)   Ht 5\' 4"  (1.626 m)   Wt 183 lb 6.4 oz (83.2 kg)   SpO2 97%   BMI 31.48 kg/m  Constitutional: Pt appears in NAD               HENT: Head: NCAT.                Right Ear: External ear normal.                 Left Ear: External ear normal.                Eyes: . Pupils are equal, round, and reactive to light. Conjunctivae and EOM are normal               Nose: without d/c or deformity               Neck: Neck supple. Gross normal ROM               Cardiovascular: Normal rate and regular rhythm.                 Pulmonary/Chest: Effort normal and breath sounds without rales or wheezing.                Abd:  Soft, NT, ND, + BS, no organomegaly               Neurological: Pt is alert. At baseline orientation, motor grossly intact               Skin: Skin is warm. No rashes, no other new lesions, LE edema - none               Psychiatric: Pt behavior is normal without agitation   Micro: none  Cardiac tracings I have personally interpreted today:  SR with PAC 100  Pertinent  Radiological findings (summarize): none   Lab Results  Component Value Date   WBC 7.4 11/21/2019   HGB 14.2 11/21/2019   HCT 43.1 11/21/2019   PLT 232.0 11/21/2019   GLUCOSE 97 05/21/2020   CHOL 132 05/21/2020   TRIG 111.0 05/21/2020   HDL 58.10 05/21/2020   LDLDIRECT 99.9 03/07/2007   LDLCALC 51 05/21/2020   ALT 9 05/21/2020   AST 14 05/21/2020   NA 138 05/21/2020   K 4.1 05/21/2020   CL 101 05/21/2020   CREATININE 1.00 05/21/2020   BUN 12 05/21/2020   CO2 31 05/21/2020   TSH 0.95 11/21/2019   HGBA1C 6.6 (H) 05/21/2020   MICROALBUR 21.9 (H) 11/21/2019   Assessment/Plan:  Airika E Andrzejewski is a 75 y.o. Black or African American [2] female with  has a past medical history of ALLERGIC RHINITIS (11/24/2006), Allergy, ANXIETY (07/10/2008), DIABETES MELLITUS, TYPE II (11/24/2006), GERD (gastroesophageal reflux disease), History of shingles, and HYPERLIPIDEMIA (11/24/2006).  Encounter for well adult exam with abnormal findings Age and sex appropriate education and counseling updated with regular exercise and diet Referrals for preventative services - pt to call for eye exam soon Immunizations addressed - none needed Smoking counseling  - counseled to quit Evidence for depression or other mood disorder - none significant Most recent labs reviewed. I have personally reviewed and have noted: 1) the patient's medical and social history 2) The patient's current medications and supplements 3) The patient's height, weight, and BMI have been recorded in the chart   Diabetes Lab Results  Component Value Date   HGBA1C 6.6 (H) 05/21/2020   Stable, pt to continue current medical treatment metformin   ELEVATED BLOOD PRESSURE WITHOUT DIAGNOSIS OF HYPERTENSION Pt adamant BP athome < 140/90 , declines low dose arb  Hyperlipidemia Lab Results  Component Value Date   LDLCALC 51 05/21/2020   Stable, pt to continue current statin zocor 20  * Current Outpatient Medications (Endocrine &  Metabolic):  .  metFORMIN (GLUCOPHAGE-XR) 500 MG 24 hr tablet, TAKE 3 TABLETS EVERY MORNING  Current Outpatient Medications (Cardiovascular):  .  simvastatin (ZOCOR) 20 MG tablet, TAKE 1 TABLET BY MOUTH EVERY DAY IN THE EVENING   Current Outpatient Medications (Analgesics):  .  aspirin 81 MG tablet, Take 81 mg by mouth daily.   Current Outpatient Medications (Other):  Marland Kitchen  Lancets MISC, Apply 1 Device topically daily. E11.9   Vitamin D deficiency Last vitamin D Lab Results  Component Value Date   VD25OH 47.28 05/21/2020   Stable, cont oral replacement   COPD (chronic obstructive pulmonary disease) with acute bronchitis (HCC) Stable, cont current med tx - none, declines inhaler   Followup: Return in about 6 months (around 11/23/2020).  Oliver Barre, MD 05/26/2020 8:17 PM [facility]

## 2020-05-26 NOTE — Assessment & Plan Note (Signed)
Stable, cont current med tx - none, declines inhaler

## 2020-05-26 NOTE — Assessment & Plan Note (Signed)
Age and sex appropriate education and counseling updated with regular exercise and diet Referrals for preventative services - pt to call for eye exam soon Immunizations addressed - none needed Smoking counseling  - counseled to quit Evidence for depression or other mood disorder - none significant Most recent labs reviewed. I have personally reviewed and have noted: 1) the patient's medical and social history 2) The patient's current medications and supplements 3) The patient's height, weight, and BMI have been recorded in the chart

## 2020-05-26 NOTE — Assessment & Plan Note (Signed)
Last vitamin D Lab Results  Component Value Date   VD25OH 47.28 05/21/2020   Stable, cont oral replacement

## 2020-05-26 NOTE — Patient Instructions (Signed)
Please stop smoking  Please take OTC Vitamin D3 at 2000 units per day, indefinitely.  Please continue to monitor your BP at home as you do  Please continue all other medications as before, and refills have been done if requested.  Please have the pharmacy call with any other refills you may need.  Please continue your efforts at being more active, low cholesterol diet, and weight control.  You are otherwise up to date with prevention measures today.  Please keep your appointments with your specialists as you may have planned  Please make an Appointment to return in 6 months, or sooner if needed, also with Lab Appointment for testing done 3-5 days before at the FIRST FLOOR Lab (so this is for TWO appointments - please see the scheduling desk as you leave)  Due to the ongoing Covid 19 pandemic, our lab now requires an appointment for any labs done at our office.  If you need labs done and do not have an appointment, please call our office ahead of time to schedule before presenting to the lab for your testing.

## 2020-06-26 NOTE — Addendum Note (Signed)
Addended by: Casper Harrison on: 06/26/2020 03:37 PM   Modules accepted: Orders

## 2020-08-28 DIAGNOSIS — H2513 Age-related nuclear cataract, bilateral: Secondary | ICD-10-CM | POA: Diagnosis not present

## 2020-08-28 DIAGNOSIS — H43811 Vitreous degeneration, right eye: Secondary | ICD-10-CM | POA: Diagnosis not present

## 2020-08-28 DIAGNOSIS — H524 Presbyopia: Secondary | ICD-10-CM | POA: Diagnosis not present

## 2020-08-28 DIAGNOSIS — E119 Type 2 diabetes mellitus without complications: Secondary | ICD-10-CM | POA: Diagnosis not present

## 2020-08-28 DIAGNOSIS — H43392 Other vitreous opacities, left eye: Secondary | ICD-10-CM | POA: Diagnosis not present

## 2020-10-06 ENCOUNTER — Other Ambulatory Visit: Payer: Self-pay | Admitting: Internal Medicine

## 2020-10-06 NOTE — Telephone Encounter (Signed)
Please refill as per office routine med refill policy (all routine meds refilled for 3 mo or monthly per pt preference up to one year from last visit, then month to month grace period for 3 mo, then further med refills will have to be denied)  

## 2020-10-08 ENCOUNTER — Encounter: Payer: Self-pay | Admitting: Emergency Medicine

## 2020-10-08 ENCOUNTER — Ambulatory Visit
Admission: EM | Admit: 2020-10-08 | Discharge: 2020-10-08 | Disposition: A | Discharge status: Home / Self Care | Payer: Medicare PPO | Attending: Urgent Care | Admitting: Urgent Care

## 2020-10-08 DIAGNOSIS — E119 Type 2 diabetes mellitus without complications: Secondary | ICD-10-CM

## 2020-10-08 DIAGNOSIS — U071 COVID-19: Secondary | ICD-10-CM

## 2020-10-08 DIAGNOSIS — J449 Chronic obstructive pulmonary disease, unspecified: Secondary | ICD-10-CM

## 2020-10-08 DIAGNOSIS — R059 Cough, unspecified: Secondary | ICD-10-CM

## 2020-10-08 DIAGNOSIS — Z20822 Contact with and (suspected) exposure to covid-19: Secondary | ICD-10-CM | POA: Diagnosis not present

## 2020-10-08 MED ORDER — BENZONATATE 100 MG PO CAPS: 100.0000 mg | ORAL_CAPSULE | Freq: Three times a day (TID) | ORAL | 0 refills | Status: DC | PRN

## 2020-10-08 MED ORDER — MOLNUPIRAVIR EUA 200MG CAPSULE: 4.0000 | ORAL_CAPSULE | Freq: Two times a day (BID) | ORAL | 0 refills | Status: AC

## 2020-10-08 NOTE — Discharge Instructions (Addendum)
Please wait for results of your COVID test prior to starting molnupiravir.

## 2020-10-08 NOTE — ED Triage Notes (Signed)
Husband tested positive for covid two days prior with a fever and fatigue. Patient tested negative, having a mild cough. Requesting paxlovid.

## 2020-10-08 NOTE — ED Provider Notes (Signed)
[facility]   MRN: [id number] DOB: 1945/04/23  Subjective:   Brittany Oconnor is a 75 y.o. female presenting for 2-day history of acute onset fever, fatigue, cough.  Patient took an at-home COVID test and was negative.  Her husband had very similar symptoms and his home test was positive.  Patient is vaccinated, has 2 blisters.  Has a history of COPD.  Does not need an inhaler.  Has a history of diabetes well controlled with metformin only.  Denies chest pain, shortness of breath.  No current facility-administered medications for this encounter.  Current Outpatient Medications:    aspirin 81 MG tablet, Take 81 mg by mouth daily., Disp: , Rfl:    Lancets MISC, Apply 1 Device topically daily. E11.9, Disp: 100 each, Rfl: 3   metFORMIN (GLUCOPHAGE-XR) 500 MG 24 hr tablet, TAKE 3 TABLETS EVERY MORNING, Disp: 270 tablet, Rfl: 3   simvastatin (ZOCOR) 20 MG tablet, TAKE 1 TABLET BY MOUTH EVERY DAY IN THE EVENING, Disp: 90 tablet, Rfl: 3   Allergies  Allergen Reactions   Doxycycline     Past Medical History:  Diagnosis Date   ALLERGIC RHINITIS 11/24/2006   Allergy    ANXIETY 07/10/2008   DIABETES MELLITUS, TYPE II 11/24/2006   GERD (gastroesophageal reflux disease)    History of shingles    HYPERLIPIDEMIA 11/24/2006     Past Surgical History:  Procedure Laterality Date   BREAST BIOPSY Right    benign   MOUTH SURGERY      Family History  Problem Relation Age of Onset   Colon cancer Sister 46       deceased    Heart disease Sister        died of heart attack   Colon cancer Sister 12       alive   Hypertension Father    Appendicitis Father        rupture   Stroke Mother        stroke 59;died age 53    Social History   Tobacco Use   Smoking status: Every Day    Packs/day: 1.00    Years: 47.00    Pack years: 47.00    Types: Cigarettes   Smokeless tobacco: Never   Tobacco comments:    1 ppd  Substance Use Topics   Alcohol use: No    Alcohol/week: 0.0  standard drinks   Drug use: No    ROS   Objective:   Vitals: BP (!) 149/94 (BP Location: Left Arm)   Pulse 97   Temp 98 F (36.7 C) (Oral)   Resp 14   SpO2 95%   Physical Exam Constitutional:      General: She is not in acute distress.    Appearance: Normal appearance. She is well-developed. She is not ill-appearing, toxic-appearing or diaphoretic.  HENT:     Head: Normocephalic and atraumatic.     Nose: Nose normal.     Mouth/Throat:     Mouth: Mucous membranes are moist.  Eyes:     Extraocular Movements: Extraocular movements intact.     Pupils: Pupils are equal, round, and reactive to light.  Cardiovascular:     Rate and Rhythm: Normal rate and regular rhythm.     Pulses: Normal pulses.     Heart sounds: Normal heart sounds. No murmur heard.   No friction rub. No gallop.  Pulmonary:     Effort: Pulmonary effort is normal. No respiratory distress.     Breath  sounds: Normal breath sounds. No stridor. No wheezing, rhonchi or rales.  Skin:    General: Skin is warm and dry.     Findings: No rash.  Neurological:     Mental Status: She is alert and oriented to person, place, and time.  Psychiatric:        Mood and Affect: Mood normal.        Behavior: Behavior normal.        Thought Content: Thought content normal.     Assessment and Plan :   PDMP not reviewed this encounter.  1. Clinical diagnosis of COVID-19   2. Encounter for screening laboratory testing for COVID-19 virus   3. Close exposure to COVID-19 virus   4. Cough   5. Chronic obstructive pulmonary disease, unspecified COPD type (HCC)   6. Type 2 diabetes mellitus treated with insulin [facility])     Patient has a clinical diagnosis of COVID-19 given her symptoms, close exposure to a patient that tested positive with an at-home test.  Recommended molnupiravir should her COVID test result be positive.  Use supportive care otherwise. Counseled patient on potential for adverse effects with medications  prescribed/recommended today, ER and return-to-clinic precautions discussed, patient verbalized understanding.    Wallis Bamberg, PA-C 10/08/20 1427

## 2020-10-11 LAB — NOVEL CORONAVIRUS, NAA: SARS-CoV-2, NAA: DETECTED — AB

## 2020-10-15 ENCOUNTER — Other Ambulatory Visit: Payer: Self-pay | Admitting: Internal Medicine

## 2020-10-15 DIAGNOSIS — Z1231 Encounter for screening mammogram for malignant neoplasm of breast: Secondary | ICD-10-CM

## 2020-11-09 ENCOUNTER — Encounter: Payer: Self-pay | Admitting: Internal Medicine

## 2020-11-13 ENCOUNTER — Other Ambulatory Visit: Payer: Self-pay | Admitting: *Deleted

## 2020-11-13 ENCOUNTER — Ambulatory Visit (INDEPENDENT_AMBULATORY_CARE_PROVIDER_SITE_OTHER): Payer: Medicare PPO

## 2020-11-13 DIAGNOSIS — F17210 Nicotine dependence, cigarettes, uncomplicated: Secondary | ICD-10-CM

## 2020-11-13 DIAGNOSIS — Z0000 Encounter for general adult medical examination without abnormal findings: Secondary | ICD-10-CM | POA: Diagnosis not present

## 2020-11-13 DIAGNOSIS — Z87891 Personal history of nicotine dependence: Secondary | ICD-10-CM

## 2020-11-13 NOTE — Progress Notes (Addendum)
I connected with Deloris Mentink today by telephone and verified that I am speaking with the correct person using two identifiers. Location patient: home Location provider: work Persons participating in the virtual visit: Maeola Teacher, music and [employer], LPN.   I discussed the limitations, risks, security and privacy concerns of performing an evaluation and management service by telephone and the availability of in person appointments. I also discussed with the patient that there may be a patient responsible charge related to this service. The patient expressed understanding and verbally consented to this telephonic visit.    Interactive audio and video telecommunications were attempted between this provider and patient, however failed, due to patient having technical difficulties OR patient did not have access to video capability.  We continued and completed visit with audio only.  Some vital signs may be absent or patient reported.   Time Spent with patient on telephone encounter: 30 minutes  Subjective:   Genevia E Mirante is a 75 y.o. female who presents for Medicare Annual (Subsequent) preventive examination.  Review of Systems     Cardiac Risk Factors include: advanced age (>13men, >53 women);diabetes mellitus;dyslipidemia;family history of premature cardiovascular disease;smoking/ tobacco exposure     Objective:    There were no vitals filed for this visit. There is no height or weight on file to calculate BMI.  Advanced Directives 11/13/2020 05/28/2015  Does Patient Have a Medical Advance Directive? No No  Would patient like information on creating a medical advance directive? No - Patient declined -    Current Medications (verified) Outpatient Encounter Medications as of 11/13/2020  Medication Sig   aspirin 81 MG tablet Take 81 mg by mouth daily.   cholecalciferol (VITAMIN D3) 25 MCG (1000 UNIT) tablet Take 1,000 Units by mouth daily.   metFORMIN (GLUCOPHAGE-XR) 500 MG 24 hr  tablet TAKE 3 TABLETS EVERY MORNING   simvastatin (ZOCOR) 20 MG tablet TAKE 1 TABLET BY MOUTH EVERY DAY IN THE EVENING   benzonatate (TESSALON) 100 MG capsule Take 1-2 capsules (100-200 mg total) by mouth 3 (three) times daily as needed. (Patient not taking: Reported on 11/13/2020)   Lancets MISC Apply 1 Device topically daily. E11.9 (Patient not taking: Reported on 11/13/2020)   No facility-administered encounter medications on file as of 11/13/2020.    Allergies (verified) Doxycycline   History: Past Medical History:  Diagnosis Date   ALLERGIC RHINITIS 11/24/2006   Allergy    ANXIETY 07/10/2008   DIABETES MELLITUS, TYPE II 11/24/2006   GERD (gastroesophageal reflux disease)    History of shingles    HYPERLIPIDEMIA 11/24/2006   Past Surgical History:  Procedure Laterality Date   BREAST BIOPSY Right    benign   MOUTH SURGERY     Family History  Problem Relation Age of Onset   Colon cancer Sister 54       deceased    Heart disease Sister        died of heart attack   Colon cancer Sister 59       alive   Hypertension Father    Appendicitis Father        rupture   Stroke Mother        stroke 59;died age 50   Social History   Socioeconomic History   Marital status: Married    Spouse name: Not on file   Number of children: 1   Years of education: Not on file   Highest education level: Not on file  Occupational History   Not on  file  Tobacco Use   Smoking status: Every Day    Packs/day: 0.50    Years: 47.00    Pack years: 23.50    Types: Cigarettes   Smokeless tobacco: Never   Tobacco comments:    0.5 ppd  Substance and Sexual Activity   Alcohol use: No    Alcohol/week: 0.0 standard drinks   Drug use: No   Sexual activity: Not on file  Other Topics Concern   Not on file  Social History Narrative   Not on file   Social Determinants of Health   Financial Resource Strain: Not on file  Food Insecurity: No Food Insecurity   Worried About Running Out of Food in  the Last Year: Never true   Ran Out of Food in the Last Year: Never true  Transportation Needs: No Transportation Needs   Lack of Transportation (Medical): No   Lack of Transportation (Non-Medical): No  Physical Activity: Sufficiently Active   Days of Exercise per Week: 5 days   Minutes of Exercise per Session: 30 min  Stress: No Stress Concern Present   Feeling of Stress : Not at all  Social Connections: Socially Integrated   Frequency of Communication with Friends and Family: More than three times a week   Frequency of Social Gatherings with Friends and Family: More than three times a week   Attends Religious Services: More than 4 times per year   Active Member of [employer] or Organizations: Yes   Attends Engineer, structural: More than 4 times per year   Marital Status: Married    Tobacco Counseling Ready to quit: No Counseling given: Not Answered Tobacco comments: 0.5 ppd   Clinical Intake:  Pre-visit preparation completed: Yes  Pain : No/denies pain     Nutritional Risks: None Diabetes: Yes CBG done?: No Did pt. bring in CBG monitor from home?: No  How often do you need to have someone help you when you read instructions, pamphlets, or other written materials from your doctor or pharmacy?: 1 - Never What is the last grade level you completed in school?: HSG; 1 year of college  Diabetic? yes  Interpreter Needed?: No  Information entered by :: Susie Cassette, LPN   Activities of Daily Living In your present state of health, do you have any difficulty performing the following activities: 11/13/2020 05/26/2020  Hearing? N N  Vision? N N  Difficulty concentrating or making decisions? N N  Walking or climbing stairs? N N  Dressing or bathing? N N  Doing errands, shopping? N N  Preparing Food and eating ? N -  Using the Toilet? N -  In the past six months, have you accidently leaked urine? N -  Do you have problems with loss of bowel control? N -   Managing your Medications? N -  Managing your Finances? N -  Housekeeping or managing your Housekeeping? N -  Some recent data might be hidden    Patient Care Team: Corwin Levins, MD as PCP - General  Indicate any recent Medical Services you may have received from other than [facility] providers in the past year (date may be approximate).     Assessment:   This is a routine wellness examination for Shirelle.  Hearing/Vision screen Hearing Screening - Comments:: Patient denied any hearing difficulty. Vision Screening - Comments:: Patient wears glasses.  Cataract surgery scheduled for 11/14/2020 with Dr. Olivia Canter.  Dietary issues and exercise activities discussed: Current Exercise Habits: Home exercise  routine, Type of exercise: walking;Other - see comments;treadmill Esmeralda Links), Time (Minutes): 30, Frequency (Times/Week): 5, Weekly Exercise (Minutes/Week): 150, Intensity: Moderate, Exercise limited by: respiratory conditions(s)   Goals Addressed               This Visit's Progress     Patient Stated (pt-stated)        I would like to get more physically active.      Depression Screen PHQ 2/9 Scores 11/13/2020 05/26/2020 05/26/2020 05/23/2019 05/23/2019 11/16/2018 11/16/2018  PHQ - 2 Score 0 0 0 0 0 0 0  PHQ- 9 Score - - - - - - -    Fall Risk Fall Risk  11/13/2020 05/26/2020 05/26/2020 05/23/2019 05/23/2019  Falls in the past year? 0 0 0 0 0  Number falls in past yr: 0 - 0 - -  Injury with Fall? 0 - 0 - -  Risk for fall due to : No Fall Risks - - - -  Follow up Falls evaluation completed - - - -    FALL RISK PREVENTION PERTAINING TO THE HOME:  Any stairs in or around the home? Yes  If so, are there any without handrails? No  Home free of loose throw rugs in walkways, pet beds, electrical cords, etc? Yes  Adequate lighting in your home to reduce risk of falls? Yes   ASSISTIVE DEVICES UTILIZED TO PREVENT FALLS:  Life alert? Yes  Use of a cane, walker or w/c? No  Grab bars  in the bathroom? No  Shower chair or bench in shower? Yes  Elevated toilet seat or a handicapped toilet? Yes   TIMED UP AND GO:  Was the test performed? No .  Length of time to ambulate 10 feet: n/a sec.   Gait steady and fast without use of assistive device  Cognitive Function: Normal cognitive status assessed by direct observation by this Nurse Health Advisor. No abnormalities found.          Immunizations Immunization History  Administered Date(s) Administered   Fluad Quad(high Dose 65+) 11/16/2018   Influenza Whole 01/27/2010   PFIZER(Purple Top)SARS-COV-2 Vaccination 05/05/2019, 05/26/2019, 12/24/2019, 06/27/2020   Pneumococcal Conjugate-13 05/17/2014   Pneumococcal Polysaccharide-23 07/10/2008, 05/10/2017   Td 07/10/2008   Tdap 11/16/2018   Zoster, Live 07/10/2008    TDAP status: Up to date  Flu Vaccine status: Due, Education has been provided regarding the importance of this vaccine. Advised may receive this vaccine at local pharmacy or Health Dept. Aware to provide a copy of the vaccination record if obtained from local pharmacy or Health Dept. Verbalized acceptance and understanding.  Pneumococcal vaccine status: Up to date  Covid-19 vaccine status: Completed vaccines  Qualifies for Shingles Vaccine? Yes   Zostavax completed Yes   Shingrix Completed?: No.    Education has been provided regarding the importance of this vaccine. Patient has been advised to call insurance company to determine out of pocket expense if they have not yet received this vaccine. Advised may also receive vaccine at local pharmacy or Health Dept. Verbalized acceptance and understanding.  Screening Tests Health Maintenance  Topic Date Due   Zoster Vaccines- Shingrix (1 of 2) Never done   OPHTHALMOLOGY EXAM  11/28/2019   COVID-19 Vaccine (5 - Booster for Pfizer series) 10/27/2020   INFLUENZA VACCINE  10/27/2020   URINE MICROALBUMIN  11/20/2020   HEMOGLOBIN A1C  11/18/2020   FOOT EXAM   11/26/2020   TETANUS/TDAP  11/15/2028   DEXA SCAN  Completed   Hepatitis  C Screening  Completed   PNA vac Low Risk Adult  Completed   HPV VACCINES  Aged Out    Health Maintenance  Health Maintenance Due  Topic Date Due   Zoster Vaccines- Shingrix (1 of 2) Never done   OPHTHALMOLOGY EXAM  11/28/2019   COVID-19 Vaccine (5 - Booster for Pfizer series) 10/27/2020   INFLUENZA VACCINE  10/27/2020   URINE MICROALBUMIN  11/20/2020    Colorectal cancer screening: No longer required.   Mammogram status: Completed 11/21/2019. Repeat every year (Scheduled for 01/14/2021)  Bone Density status: Completed 08/24/2016. Results reflect: Bone density results: NORMAL. Repeat every 5 years.  Lung Cancer Screening: (Low Dose CT Chest recommended if Age 31-80 years, 30 pack-year currently smoking OR have quit w/in 15years.) does qualify.   Lung Cancer Screening Referral: no; order placed 11/2019  Additional Screening:  Hepatitis C Screening: does qualify; Completed yes  Vision Screening: Recommended annual ophthalmology exams for early detection of glaucoma and other disorders of the eye. Is the patient up to date with their annual eye exam?  Yes  Who is the provider or what is the name of the office in which the patient attends annual eye exams? Richard Fabian SharpScott Groat, MD If pt is not established with a provider, would they like to be referred to a provider to establish care? No .   Dental Screening: Recommended annual dental exams for proper oral hygiene  Community Resource Referral / Chronic Care Management: CRR required this visit?  No   CCM required this visit?  No      Plan:     I have personally reviewed and noted the following in the patient's chart:   Medical and social history Use of alcohol, tobacco or illicit drugs  Current medications and supplements including opioid prescriptions.  Functional ability and status Nutritional status Physical activity Advanced directives List  of other physicians Hospitalizations, surgeries, and ER visits in previous 12 months Vitals Screenings to include cognitive, depression, and falls Referrals and appointments  In addition, I have reviewed and discussed with patient certain preventive protocols, quality metrics, and best practice recommendations. A written personalized care plan for preventive services as well as general preventive health recommendations were provided to patient.     Mickeal NeedyShenika N Malavika Lira, LPN   8/41/32448/18/2022  Nurse Notes:  Patient is cogitatively intact. There were no vitals filed for this visit. There is no height or weight on file to calculate BMI. Patient stated that she has no issues with gait or balance; does not use any assistive devices. Medications reviewed with patient; no opioid use noted.   Medical screening examination/treatment/procedure(s) were performed by non-physician practitioner and as supervising physician I was immediately available for consultation/collaboration.  I agree with above. Jacinta Shoe, MD

## 2020-11-13 NOTE — Patient Instructions (Signed)
Brittany Oconnor , Thank you for taking time to come for your Medicare Wellness Visit. I appreciate your ongoing commitment to your health goals. Please review the following plan we discussed and let me know if I can assist you in the future.   Screening recommendations/referrals: Colonoscopy: last done 06/17/2015; no repeat due to age Mammogram: last done 11/21/2019; scheduled for 01/14/2021 Bone Density: last done 08/24/2016; due every 5 years Recommended yearly ophthalmology/optometry visit for glaucoma screening and checkup Recommended yearly dental visit for hygiene and checkup  Vaccinations: Influenza vaccine: due Fall 2022 Pneumococcal vaccine: 07/10/2008, 05/10/2017, 05/17/2014 Tdap vaccine: 11/16/2018; due every 10 years Shingles vaccine: never done; patient can check with local pharmacy for vaccine.   Covid-19: 05/05/2019, 05/26/2019, 12/24/2019, 06/27/2020  Advanced directives: Advance directive discussed with you today. I have provided a copy for you to complete at home and have notarized. Once this is complete please bring a copy in to our office so we can scan it into your chart.  Conditions/risks identified: Yes; Client understands the importance of follow-up with providers by attending scheduled visits and discussed goals to eat healthier, increase physical activity, exercise the brain, socialize more, get enough sleep and make time for laughter.  Next appointment: Please schedule your next Medicare Wellness Visit with your Nurse Health Advisor in 1 year by calling [phone].   Preventive Care 2 Years and Older, Female Preventive care refers to lifestyle choices and visits with your health care provider that can promote health and wellness. What does preventive care include? A yearly physical exam. This is also called an annual well check. Dental exams once or twice a year. Routine eye exams. Ask your health care provider how often you should have your eyes checked. Personal lifestyle  choices, including: Daily care of your teeth and gums. Regular physical activity. Eating a healthy diet. Avoiding tobacco and drug use. Limiting alcohol use. Practicing safe sex. Taking low-dose aspirin every day. Taking vitamin and mineral supplements as recommended by your health care provider. What happens during an annual well check? The services and screenings done by your health care provider during your annual well check will depend on your age, overall health, lifestyle risk factors, and family history of disease. Counseling  Your health care provider may ask you questions about your: Alcohol use. Tobacco use. Drug use. Emotional well-being. Home and relationship well-being. Sexual activity. Eating habits. History of falls. Memory and ability to understand (cognition). Work and work Astronomer. Reproductive health. Screening  You may have the following tests or measurements: Height, weight, and BMI. Blood pressure. Lipid and cholesterol levels. These may be checked every 5 years, or more frequently if you are over 3 years old. Skin check. Lung cancer screening. You may have this screening every year starting at age 68 if you have a 30-pack-year history of smoking and currently smoke or have quit within the past 15 years. Fecal occult blood test (FOBT) of the stool. You may have this test every year starting at age 50. Flexible sigmoidoscopy or colonoscopy. You may have a sigmoidoscopy every 5 years or a colonoscopy every 10 years starting at age 47. Hepatitis C blood test. Hepatitis B blood test. Sexually transmitted disease (STD) testing. Diabetes screening. This is done by checking your blood sugar (glucose) after you have not eaten for a while (fasting). You may have this done every 1-3 years. Bone density scan. This is done to screen for osteoporosis. You may have this done starting at age 76. Mammogram. This may  be done every 1-2 years. Talk to your health care  provider about how often you should have regular mammograms. Talk with your health care provider about your test results, treatment options, and if necessary, the need for more tests. Vaccines  Your health care provider may recommend certain vaccines, such as: Influenza vaccine. This is recommended every year. Tetanus, diphtheria, and acellular pertussis (Tdap, Td) vaccine. You may need a Td booster every 10 years. Zoster vaccine. You may need this after age 64. Pneumococcal 13-valent conjugate (PCV13) vaccine. One dose is recommended after age 53. Pneumococcal polysaccharide (PPSV23) vaccine. One dose is recommended after age 55. Talk to your health care provider about which screenings and vaccines you need and how often you need them. This information is not intended to replace advice given to you by your health care provider. Make sure you discuss any questions you have with your health care provider. Document Released: 04/11/2015 Document Revised: 12/03/2015 Document Reviewed: 01/14/2015 Elsevier Interactive Patient Education  2017 [employer].  Fall Prevention in the Home Falls can cause injuries. They can happen to people of all ages. There are many things you can do to make your home safe and to help prevent falls. What can I do on the outside of my home? Regularly fix the edges of walkways and driveways and fix any cracks. Remove anything that might make you trip as you walk through a door, such as a raised step or threshold. Trim any bushes or trees on the path to your home. Use bright outdoor lighting. Clear any walking paths of anything that might make someone trip, such as rocks or tools. Regularly check to see if handrails are loose or broken. Make sure that both sides of any steps have handrails. Any raised decks and porches should have guardrails on the edges. Have any leaves, snow, or ice cleared regularly. Use sand or salt on walking paths during winter. Clean up any  spills in your garage right away. This includes oil or grease spills. What can I do in the bathroom? Use night lights. Install grab bars by the toilet and in the tub and shower. Do not use towel bars as grab bars. Use non-skid mats or decals in the tub or shower. If you need to sit down in the shower, use a plastic, non-slip stool. Keep the floor dry. Clean up any water that spills on the floor as soon as it happens. Remove soap buildup in the tub or shower regularly. Attach bath mats securely with double-sided non-slip rug tape. Do not have throw rugs and other things on the floor that can make you trip. What can I do in the bedroom? Use night lights. Make sure that you have a light by your bed that is easy to reach. Do not use any sheets or blankets that are too big for your bed. They should not hang down onto the floor. Have a firm chair that has side arms. You can use this for support while you get dressed. Do not have throw rugs and other things on the floor that can make you trip. What can I do in the kitchen? Clean up any spills right away. Avoid walking on wet floors. Keep items that you use a lot in easy-to-reach places. If you need to reach something above you, use a strong step stool that has a grab bar. Keep electrical cords out of the way. Do not use floor polish or wax that makes floors slippery. If you must use  wax, use non-skid floor wax. Do not have throw rugs and other things on the floor that can make you trip. What can I do with my stairs? Do not leave any items on the stairs. Make sure that there are handrails on both sides of the stairs and use them. Fix handrails that are broken or loose. Make sure that handrails are as long as the stairways. Check any carpeting to make sure that it is firmly attached to the stairs. Fix any carpet that is loose or worn. Avoid having throw rugs at the top or bottom of the stairs. If you do have throw rugs, attach them to the floor  with carpet tape. Make sure that you have a light switch at the top of the stairs and the bottom of the stairs. If you do not have them, ask someone to add them for you. What else can I do to help prevent falls? Wear shoes that: Do not have high heels. Have rubber bottoms. Are comfortable and fit you well. Are closed at the toe. Do not wear sandals. If you use a stepladder: Make sure that it is fully opened. Do not climb a closed stepladder. Make sure that both sides of the stepladder are locked into place. Ask someone to hold it for you, if possible. Clearly mark and make sure that you can see: Any grab bars or handrails. First and last steps. Where the edge of each step is. Use tools that help you move around (mobility aids) if they are needed. These include: Canes. Walkers. Scooters. Crutches. Turn on the lights when you go into a dark area. Replace any light bulbs as soon as they burn out. Set up your furniture so you have a clear path. Avoid moving your furniture around. If any of your floors are uneven, fix them. If there are any pets around you, be aware of where they are. Review your medicines with your doctor. Some medicines can make you feel dizzy. This can increase your chance of falling. Ask your doctor what other things that you can do to help prevent falls. This information is not intended to replace advice given to you by your health care provider. Make sure you discuss any questions you have with your health care provider. Document Released: 01/09/2009 Document Revised: 08/21/2015 Document Reviewed: 04/19/2014 Elsevier Interactive Patient Education  2017 [employer].

## 2020-11-14 DIAGNOSIS — H25811 Combined forms of age-related cataract, right eye: Secondary | ICD-10-CM | POA: Diagnosis not present

## 2020-11-21 ENCOUNTER — Other Ambulatory Visit: Payer: Self-pay | Admitting: Internal Medicine

## 2020-11-21 ENCOUNTER — Other Ambulatory Visit (INDEPENDENT_AMBULATORY_CARE_PROVIDER_SITE_OTHER): Payer: Medicare PPO

## 2020-11-21 DIAGNOSIS — E530 Riboflavin deficiency: Secondary | ICD-10-CM | POA: Diagnosis not present

## 2020-11-21 DIAGNOSIS — E119 Type 2 diabetes mellitus without complications: Secondary | ICD-10-CM | POA: Diagnosis not present

## 2020-11-21 DIAGNOSIS — E559 Vitamin D deficiency, unspecified: Secondary | ICD-10-CM

## 2020-11-21 LAB — URINALYSIS, ROUTINE W REFLEX MICROSCOPIC
Bilirubin Urine: NEGATIVE
Hgb urine dipstick: NEGATIVE
Ketones, ur: NEGATIVE
Nitrite: NEGATIVE
Specific Gravity, Urine: 1.025 (ref 1.000–1.030)
Urine Glucose: NEGATIVE
Urobilinogen, UA: 0.2 (ref 0.0–1.0)
pH: 6 (ref 5.0–8.0)

## 2020-11-21 LAB — CBC WITH DIFFERENTIAL/PLATELET
Basophils Absolute: 0.1 10*3/uL (ref 0.0–0.1)
Basophils Relative: 0.8 % (ref 0.0–3.0)
Eosinophils Absolute: 0.1 10*3/uL (ref 0.0–0.7)
Eosinophils Relative: 0.8 % (ref 0.0–5.0)
HCT: 43.4 % (ref 36.0–46.0)
Hemoglobin: 14.2 g/dL (ref 12.0–15.0)
Lymphocytes Relative: 41.5 % (ref 12.0–46.0)
Lymphs Abs: 2.9 10*3/uL (ref 0.7–4.0)
MCHC: 32.7 g/dL (ref 30.0–36.0)
MCV: 83.3 fl (ref 78.0–100.0)
Monocytes Absolute: 0.5 10*3/uL (ref 0.1–1.0)
Monocytes Relative: 7.3 % (ref 3.0–12.0)
Neutro Abs: 3.4 10*3/uL (ref 1.4–7.7)
Neutrophils Relative %: 49.6 % (ref 43.0–77.0)
Platelets: 238 10*3/uL (ref 150.0–400.0)
RBC: 5.21 Mil/uL — ABNORMAL HIGH (ref 3.87–5.11)
RDW: 14 % (ref 11.5–15.5)
WBC: 6.9 10*3/uL (ref 4.0–10.5)

## 2020-11-21 LAB — HEPATIC FUNCTION PANEL
ALT: 10 U/L (ref 0–35)
AST: 17 U/L (ref 0–37)
Albumin: 4.1 g/dL (ref 3.5–5.2)
Alkaline Phosphatase: 49 U/L (ref 39–117)
Bilirubin, Direct: 0.1 mg/dL (ref 0.0–0.3)
Total Bilirubin: 0.5 mg/dL (ref 0.2–1.2)
Total Protein: 7.1 g/dL (ref 6.0–8.3)

## 2020-11-21 LAB — LIPID PANEL
Cholesterol: 137 mg/dL (ref 0–200)
HDL: 58.3 mg/dL (ref >39.00)
LDL Cholesterol: 58 mg/dL (ref 0–99)
NonHDL: 78.45
Total CHOL/HDL Ratio: 2
Triglycerides: 101 mg/dL (ref 0.0–149.0)
VLDL: 20.2 mg/dL (ref 0.0–40.0)

## 2020-11-21 LAB — BASIC METABOLIC PANEL
BUN: 15 mg/dL (ref 6–23)
CO2: 26 mEq/L (ref 19–32)
Calcium: 9.9 mg/dL (ref 8.4–10.5)
Chloride: 102 mEq/L (ref 96–112)
Creatinine, Ser: 1.05 mg/dL (ref 0.40–1.20)
GFR: 51.61 mL/min — ABNORMAL LOW (ref >60.00)
Glucose, Bld: 99 mg/dL (ref 70–99)
Potassium: 4.3 mEq/L (ref 3.5–5.1)
Sodium: 139 mEq/L (ref 135–145)

## 2020-11-21 LAB — HEMOGLOBIN A1C: Hgb A1c MFr Bld: 6.7 % — ABNORMAL HIGH (ref 4.6–6.5)

## 2020-11-21 LAB — VITAMIN D 25 HYDROXY (VIT D DEFICIENCY, FRACTURES): VITD: 51.09 ng/mL (ref 30.00–100.00)

## 2020-11-21 LAB — MICROALBUMIN / CREATININE URINE RATIO
Creatinine,U: 140 mg/dL
Microalb Creat Ratio: 7.5 mg/g (ref 0.0–30.0)
Microalb, Ur: 10.5 mg/dL — ABNORMAL HIGH (ref 0.0–1.9)

## 2020-11-21 LAB — TSH: TSH: 1.02 u[IU]/mL (ref 0.35–5.50)

## 2020-11-21 LAB — VITAMIN B12: Vitamin B-12: 289 pg/mL (ref 211–911)

## 2020-11-25 ENCOUNTER — Encounter: Payer: Self-pay | Admitting: Internal Medicine

## 2020-11-25 ENCOUNTER — Other Ambulatory Visit: Payer: Self-pay

## 2020-11-25 ENCOUNTER — Ambulatory Visit (INDEPENDENT_AMBULATORY_CARE_PROVIDER_SITE_OTHER): Payer: Medicare PPO | Admitting: Internal Medicine

## 2020-11-25 VITALS — BP 122/70 | HR 111 | Ht 64.0 in | Wt 174.0 lb

## 2020-11-25 DIAGNOSIS — G629 Polyneuropathy, unspecified: Secondary | ICD-10-CM

## 2020-11-25 DIAGNOSIS — E538 Deficiency of other specified B group vitamins: Secondary | ICD-10-CM | POA: Diagnosis not present

## 2020-11-25 DIAGNOSIS — R030 Elevated blood-pressure reading, without diagnosis of hypertension: Secondary | ICD-10-CM | POA: Diagnosis not present

## 2020-11-25 DIAGNOSIS — E7800 Pure hypercholesterolemia, unspecified: Secondary | ICD-10-CM | POA: Diagnosis not present

## 2020-11-25 DIAGNOSIS — E119 Type 2 diabetes mellitus without complications: Secondary | ICD-10-CM

## 2020-11-25 DIAGNOSIS — E559 Vitamin D deficiency, unspecified: Secondary | ICD-10-CM | POA: Diagnosis not present

## 2020-11-25 NOTE — Patient Instructions (Signed)
Please continue all other medications as before, and refills have been done if requested.  Please have the pharmacy call with any other refills you may need.  Please continue your efforts at being more active, low cholesterol diet, and weight control.  Please keep your appointments with your specialists as you may have planned  Please make an Appointment to return in 6 months, or sooner if needed, also with Lab Appointment for testing done 3-5 days before at the FIRST FLOOR Lab (so this is for TWO appointments - please see the scheduling desk as you leave)  Due to the ongoing Covid 19 pandemic, our lab now requires an appointment for any labs done at our office.  If you need labs done and do not have an appointment, please call our office ahead of time to schedule before presenting to the lab for your testing.   

## 2020-11-25 NOTE — Progress Notes (Signed)
Patient ID: Brittany Oconnor, female   DOB: 1945/06/22, 75 y.o.   MRN: [id number]        Chief Complaint: follow up HTN, HLD and hyperglycemial and toe numbness       HPI:  Brittany Oconnor is a 75 y.o. female here with c/o new onset 3-4 mo worsening bilateral toe numbness and mild discomfort worse to going to bed at night.  Not really an issue during the day and doesn't really think about it.  Pt denies chest pain, increased sob or doe, wheezing, orthopnea, PND, increased LE swelling, palpitations, dizziness or syncope.   Pt denies polydipsia, polyuria, or new other focal neuro s/s.    lso, S/p cataract surgury on right recently, for left to be done soon.   Pt denies fever, wt loss, night sweats, loss of appetite, or other constitutional symptoms  No other new complaints Wt Readings from Last 3 Encounters:  11/25/20 174 lb (78.9 kg)  05/26/20 183 lb 6.4 oz (83.2 kg)  11/27/19 182 lb (82.6 kg)   BP Readings from Last 3 Encounters:  11/25/20 122/70  10/08/20 (!) 149/94  05/26/20 (!) 158/98         Past Medical History:  Diagnosis Date   ALLERGIC RHINITIS 11/24/2006   Allergy    ANXIETY 07/10/2008   DIABETES MELLITUS, TYPE II 11/24/2006   GERD (gastroesophageal reflux disease)    History of shingles    HYPERLIPIDEMIA 11/24/2006   Past Surgical History:  Procedure Laterality Date   BREAST BIOPSY Right    benign   MOUTH SURGERY      reports that she has been smoking cigarettes. She has a 23.50 pack-year smoking history. She has never used smokeless tobacco. She reports that she does not drink alcohol and does not use drugs. family history includes Appendicitis in her father; Colon cancer (age of onset: 75) in her sister; Colon cancer (age of onset: 56) in her sister; Heart disease in her sister; Hypertension in her father; Stroke in her mother. Allergies  Allergen Reactions   Doxycycline    Current Outpatient Medications on File Prior to Visit  Medication Sig Dispense Refill   aspirin 81  MG tablet Take 81 mg by mouth daily.     cholecalciferol (VITAMIN D3) 25 MCG (1000 UNIT) tablet Take 1,000 Units by mouth daily.     Lancets MISC Apply 1 Device topically daily. E11.9 100 each 3   metFORMIN (GLUCOPHAGE-XR) 500 MG 24 hr tablet TAKE 3 TABLETS EVERY MORNING 270 tablet 3   simvastatin (ZOCOR) 20 MG tablet TAKE 1 TABLET BY MOUTH EVERY DAY IN THE EVENING 90 tablet 3   benzonatate (TESSALON) 100 MG capsule Take 1-2 capsules (100-200 mg total) by mouth 3 (three) times daily as needed. (Patient not taking: Reported on 11/25/2020) 60 capsule 0   No current facility-administered medications on file prior to visit.        ROS:  All others reviewed and negative.  Objective        PE:  BP 122/70 (BP Location: Left Arm, Patient Position: Sitting, Cuff Size: Normal)   Pulse (!) 111   Ht 5\' 4"  (1.626 m)   Wt 174 lb (78.9 kg)   SpO2 98%   BMI 29.87 kg/m                 Constitutional: Pt appears in NAD               HENT: Head: NCAT.  Right Ear: External ear normal.                 Left Ear: External ear normal.                Eyes: . Pupils are equal, round, and reactive to light. Conjunctivae and EOM are normal               Nose: without d/c or deformity               Neck: Neck supple. Gross normal ROM               Cardiovascular: Normal rate and regular rhythm.                 Pulmonary/Chest: Effort normal and breath sounds without rales or wheezing.                Abd:  Soft, NT, ND, + BS, no organomegaly               Neurological: Pt is alert. At baseline orientation, motor grossly intact, bilateral toes reduced sens to LT               Skin: Skin is warm. No rashes, no other new lesions, LE edema - none               Psychiatric: Pt behavior is normal without agitation   Micro: none  Cardiac tracings I have personally interpreted today:  none  Pertinent Radiological findings (summarize): none   Lab Results  Component Value Date   WBC 6.9 11/21/2020    HGB 14.2 11/21/2020   HCT 43.4 11/21/2020   PLT 238.0 11/21/2020   GLUCOSE 99 11/21/2020   CHOL 137 11/21/2020   TRIG 101.0 11/21/2020   HDL 58.30 11/21/2020   LDLDIRECT 99.9 03/07/2007   LDLCALC 58 11/21/2020   ALT 10 11/21/2020   AST 17 11/21/2020   NA 139 11/21/2020   K 4.3 11/21/2020   CL 102 11/21/2020   CREATININE 1.05 11/21/2020   BUN 15 11/21/2020   CO2 26 11/21/2020   TSH 1.02 11/21/2020   HGBA1C 6.7 (H) 11/21/2020   MICROALBUR 10.5 (H) 11/21/2020   Assessment/Plan:  Brittany Oconnor is a 75 y.o. Black or African American [2] female with  has a past medical history of ALLERGIC RHINITIS (11/24/2006), Allergy, ANXIETY (07/10/2008), DIABETES MELLITUS, TYPE II (11/24/2006), GERD (gastroesophageal reflux disease), History of shingles, and HYPERLIPIDEMIA (11/24/2006).  Neuropathy Recent onset symptoms, mild overall, for B12 with labs, o/w declines elavil for now,  to f/u any worsening symptoms or concerns  Hyperlipidemia Lab Results  Component Value Date   LDLCALC 58 11/21/2020   Stable, pt to continue current statin zocor 20   ELEVATED BLOOD PRESSURE WITHOUT DIAGNOSIS OF HYPERTENSION BP Readings from Last 3 Encounters:  11/25/20 122/70  10/08/20 (!) 149/94  05/26/20 (!) 158/98   Stable, pt to continue medical treatment , delcines low dose ace or arb for now   Diabetes Lab Results  Component Value Date   HGBA1C 6.7 (H) 11/21/2020   Stable, pt to continue current medical treatment metformin  Followup: Return in about 6 months (around 05/26/2021), or if symptoms worsen or fail to improve.  Oliver Barre, MD 11/29/2020 6:34 PM [facility]

## 2020-11-29 ENCOUNTER — Encounter: Payer: Self-pay | Admitting: Internal Medicine

## 2020-11-29 DIAGNOSIS — G629 Polyneuropathy, unspecified: Secondary | ICD-10-CM | POA: Insufficient documentation

## 2020-11-29 NOTE — Assessment & Plan Note (Signed)
Lab Results  Component Value Date   LDLCALC 58 11/21/2020   Stable, pt to continue current statin zocor 20

## 2020-11-29 NOTE — Assessment & Plan Note (Signed)
BP Readings from Last 3 Encounters:  11/25/20 122/70  10/08/20 (!) 149/94  05/26/20 (!) 158/98   Stable, pt to continue medical treatment , delcines low dose ace or arb for now

## 2020-11-29 NOTE — Assessment & Plan Note (Signed)
Recent onset symptoms, mild overall, for B12 with labs, o/w declines elavil for now,  to f/u any worsening symptoms or concerns

## 2020-11-29 NOTE — Assessment & Plan Note (Signed)
Lab Results  Component Value Date   HGBA1C 6.7 (H) 11/21/2020   Stable, pt to continue current medical treatment metformin

## 2020-12-17 DIAGNOSIS — H2512 Age-related nuclear cataract, left eye: Secondary | ICD-10-CM | POA: Diagnosis not present

## 2020-12-22 ENCOUNTER — Other Ambulatory Visit: Payer: Self-pay

## 2020-12-22 ENCOUNTER — Ambulatory Visit (INDEPENDENT_AMBULATORY_CARE_PROVIDER_SITE_OTHER)
Admission: RE | Admit: 2020-12-22 | Discharge: 2020-12-22 | Disposition: A | Discharge status: Home / Self Care | Payer: Medicare PPO | Source: Ambulatory Visit | Attending: Cardiology | Admitting: Cardiology

## 2020-12-22 DIAGNOSIS — Z87891 Personal history of nicotine dependence: Secondary | ICD-10-CM | POA: Diagnosis not present

## 2020-12-22 DIAGNOSIS — F17210 Nicotine dependence, cigarettes, uncomplicated: Secondary | ICD-10-CM | POA: Diagnosis not present

## 2021-01-01 ENCOUNTER — Other Ambulatory Visit: Payer: Self-pay | Admitting: Internal Medicine

## 2021-01-01 NOTE — Telephone Encounter (Signed)
Please refill as per office routine med refill policy (all routine meds to be refilled for 3 mo or monthly (per pt preference) up to one year from last visit, then month to month grace period for 3 mo, then further med refills will have to be denied) ? ?

## 2021-01-09 ENCOUNTER — Encounter: Payer: Self-pay | Admitting: *Deleted

## 2021-01-09 DIAGNOSIS — F17210 Nicotine dependence, cigarettes, uncomplicated: Secondary | ICD-10-CM

## 2021-01-09 DIAGNOSIS — Z87891 Personal history of nicotine dependence: Secondary | ICD-10-CM

## 2021-01-14 ENCOUNTER — Ambulatory Visit
Admission: RE | Admit: 2021-01-14 | Discharge: 2021-01-14 | Disposition: A | Discharge status: Home / Self Care | Payer: Medicare PPO | Source: Ambulatory Visit | Attending: Internal Medicine | Admitting: Internal Medicine

## 2021-01-14 ENCOUNTER — Other Ambulatory Visit: Payer: Self-pay

## 2021-01-14 DIAGNOSIS — Z1231 Encounter for screening mammogram for malignant neoplasm of breast: Secondary | ICD-10-CM | POA: Diagnosis not present

## 2021-04-08 DIAGNOSIS — H2512 Age-related nuclear cataract, left eye: Secondary | ICD-10-CM | POA: Diagnosis not present

## 2021-04-08 DIAGNOSIS — E119 Type 2 diabetes mellitus without complications: Secondary | ICD-10-CM | POA: Diagnosis not present

## 2021-04-08 DIAGNOSIS — Z961 Presence of intraocular lens: Secondary | ICD-10-CM | POA: Diagnosis not present

## 2021-04-08 DIAGNOSIS — H43811 Vitreous degeneration, right eye: Secondary | ICD-10-CM | POA: Diagnosis not present

## 2021-04-08 DIAGNOSIS — H43392 Other vitreous opacities, left eye: Secondary | ICD-10-CM | POA: Diagnosis not present

## 2021-05-22 ENCOUNTER — Other Ambulatory Visit (INDEPENDENT_AMBULATORY_CARE_PROVIDER_SITE_OTHER): Payer: Medicare PPO

## 2021-05-22 DIAGNOSIS — E559 Vitamin D deficiency, unspecified: Secondary | ICD-10-CM

## 2021-05-22 DIAGNOSIS — E538 Deficiency of other specified B group vitamins: Secondary | ICD-10-CM

## 2021-05-22 DIAGNOSIS — E119 Type 2 diabetes mellitus without complications: Secondary | ICD-10-CM

## 2021-05-22 LAB — CBC WITH DIFFERENTIAL/PLATELET
Basophils Absolute: 0.1 10*3/uL (ref 0.0–0.1)
Basophils Relative: 0.7 % (ref 0.0–3.0)
Eosinophils Absolute: 0.1 10*3/uL (ref 0.0–0.7)
Eosinophils Relative: 0.9 % (ref 0.0–5.0)
HCT: 42.8 % (ref 36.0–46.0)
Hemoglobin: 13.7 g/dL (ref 12.0–15.0)
Lymphocytes Relative: 45.1 % (ref 12.0–46.0)
Lymphs Abs: 3.3 10*3/uL (ref 0.7–4.0)
MCHC: 32.1 g/dL (ref 30.0–36.0)
MCV: 84 fl (ref 78.0–100.0)
Monocytes Absolute: 0.6 10*3/uL (ref 0.1–1.0)
Monocytes Relative: 8 % (ref 3.0–12.0)
Neutro Abs: 3.3 10*3/uL (ref 1.4–7.7)
Neutrophils Relative %: 45.3 % (ref 43.0–77.0)
Platelets: 249 10*3/uL (ref 150.0–400.0)
RBC: 5.09 Mil/uL (ref 3.87–5.11)
RDW: 14.3 % (ref 11.5–15.5)
WBC: 7.3 10*3/uL (ref 4.0–10.5)

## 2021-05-22 LAB — URINALYSIS, ROUTINE W REFLEX MICROSCOPIC
Bilirubin Urine: NEGATIVE
Ketones, ur: NEGATIVE
Nitrite: NEGATIVE
Specific Gravity, Urine: 1.02 (ref 1.000–1.030)
Total Protein, Urine: NEGATIVE
Urine Glucose: NEGATIVE
Urobilinogen, UA: 0.2 (ref 0.0–1.0)
pH: 5.5 (ref 5.0–8.0)

## 2021-05-22 LAB — HEPATIC FUNCTION PANEL
ALT: 11 U/L (ref 0–35)
AST: 16 U/L (ref 0–37)
Albumin: 4.3 g/dL (ref 3.5–5.2)
Alkaline Phosphatase: 48 U/L (ref 39–117)
Bilirubin, Direct: 0 mg/dL (ref 0.0–0.3)
Total Bilirubin: 0.4 mg/dL (ref 0.2–1.2)
Total Protein: 7.4 g/dL (ref 6.0–8.3)

## 2021-05-22 LAB — LIPID PANEL
Cholesterol: 155 mg/dL (ref 0–200)
HDL: 68.6 mg/dL (ref >39.00)
LDL Cholesterol: 69 mg/dL (ref 0–99)
NonHDL: 86.54
Total CHOL/HDL Ratio: 2
Triglycerides: 86 mg/dL (ref 0.0–149.0)
VLDL: 17.2 mg/dL (ref 0.0–40.0)

## 2021-05-22 LAB — BASIC METABOLIC PANEL
BUN: 19 mg/dL (ref 6–23)
CO2: 32 mEq/L (ref 19–32)
Calcium: 9.7 mg/dL (ref 8.4–10.5)
Chloride: 103 mEq/L (ref 96–112)
Creatinine, Ser: 0.96 mg/dL (ref 0.40–1.20)
GFR: 57.26 mL/min — ABNORMAL LOW (ref >60.00)
Glucose, Bld: 105 mg/dL — ABNORMAL HIGH (ref 70–99)
Potassium: 4 mEq/L (ref 3.5–5.1)
Sodium: 139 mEq/L (ref 135–145)

## 2021-05-22 LAB — TSH: TSH: 1.27 u[IU]/mL (ref 0.35–5.50)

## 2021-05-22 LAB — VITAMIN B12: Vitamin B-12: 224 pg/mL (ref 211–911)

## 2021-05-22 LAB — VITAMIN D 25 HYDROXY (VIT D DEFICIENCY, FRACTURES): VITD: 57.54 ng/mL (ref 30.00–100.00)

## 2021-05-22 LAB — HEMOGLOBIN A1C: Hgb A1c MFr Bld: 6.6 % — ABNORMAL HIGH (ref 4.6–6.5)

## 2021-05-26 ENCOUNTER — Other Ambulatory Visit: Payer: Self-pay

## 2021-05-26 ENCOUNTER — Ambulatory Visit (INDEPENDENT_AMBULATORY_CARE_PROVIDER_SITE_OTHER): Payer: Medicare PPO | Admitting: Internal Medicine

## 2021-05-26 ENCOUNTER — Encounter: Payer: Self-pay | Admitting: Internal Medicine

## 2021-05-26 VITALS — BP 132/72 | HR 74 | Temp 98.4°F | Ht 64.0 in | Wt 175.8 lb

## 2021-05-26 DIAGNOSIS — E538 Deficiency of other specified B group vitamins: Secondary | ICD-10-CM

## 2021-05-26 DIAGNOSIS — E119 Type 2 diabetes mellitus without complications: Secondary | ICD-10-CM

## 2021-05-26 DIAGNOSIS — E559 Vitamin D deficiency, unspecified: Secondary | ICD-10-CM | POA: Diagnosis not present

## 2021-05-26 DIAGNOSIS — J209 Acute bronchitis, unspecified: Secondary | ICD-10-CM | POA: Diagnosis not present

## 2021-05-26 DIAGNOSIS — Z0001 Encounter for general adult medical examination with abnormal findings: Secondary | ICD-10-CM | POA: Diagnosis not present

## 2021-05-26 DIAGNOSIS — F17200 Nicotine dependence, unspecified, uncomplicated: Secondary | ICD-10-CM | POA: Diagnosis not present

## 2021-05-26 DIAGNOSIS — J440 Chronic obstructive pulmonary disease with acute lower respiratory infection: Secondary | ICD-10-CM | POA: Diagnosis not present

## 2021-05-26 DIAGNOSIS — E7800 Pure hypercholesterolemia, unspecified: Secondary | ICD-10-CM

## 2021-05-26 NOTE — Assessment & Plan Note (Signed)
Last vitamin D Lab Results  Component Value Date   VD25OH 57.54 05/22/2021   Stable, cont oral replacement

## 2021-05-26 NOTE — Assessment & Plan Note (Signed)
Stable, pt to continue inhaler prn °

## 2021-05-26 NOTE — Assessment & Plan Note (Signed)
Age and sex appropriate education and counseling updated with regular exercise and diet Referrals for preventative services - none needed Immunizations addressed - declines covid booster, shingrix, flu shot Smoking counseling  - counsled to quit smoking, pt not ready Evidence for depression or other mood disorder - chronic anxiety no change Most recent labs reviewed. I have personally reviewed and have noted: 1) the patient's medical and social history 2) The patient's current medications and supplements 3) The patient's height, weight, and BMI have been recorded in the chart

## 2021-05-26 NOTE — Assessment & Plan Note (Signed)
Lab Results  Component Value Date   VITAMINB12 224 05/22/2021   Low, to start oral replacement - b12 1000 mcg qd

## 2021-05-26 NOTE — Assessment & Plan Note (Signed)
Lab Results  Component Value Date   LDLCALC 69 05/22/2021   Stable, pt to continue current statin zocor 20

## 2021-05-26 NOTE — Assessment & Plan Note (Signed)
Pt counsled to quit, pt not ready °

## 2021-05-26 NOTE — Assessment & Plan Note (Signed)
Lab Results  Component Value Date   HGBA1C 6.6 (H) 05/22/2021   Stable, pt to continue current medical treatment metformin

## 2021-05-26 NOTE — Progress Notes (Signed)
Patient ID: Brittany Oconnor, female   DOB: 1945/06/26, 76 y.o.   MRN: [id number]         Chief Complaint:: wellness exam and low b12, copd, dm, hld, low vit d       HPI:  Brittany Oconnor is a 76 y.o. female here for wellness exam; declines covid booster, flu shot, shingrix, o/w up to date; pt plans to f/u on colonoscopy scheduling herself                        Also not taking B12. Pt denies chest pain, increased sob or doe, wheezing, orthopnea, PND, increased LE swelling, palpitations, dizziness or syncope.   Pt denies polydipsia, polyuria, or new focal neuro s/s.    Pt denies fever, wt loss, night sweats, loss of appetite, or other constitutional symptoms  No other new complaints   Wt Readings from Last 3 Encounters:  05/26/21 175 lb 12.8 oz (79.7 kg)  11/25/20 174 lb (78.9 kg)  05/26/20 183 lb 6.4 oz (83.2 kg)   BP Readings from Last 3 Encounters:  05/26/21 132/72  11/25/20 122/70  10/08/20 (!) 149/94   Immunization History  Administered Date(s) Administered   Fluad Quad(high Dose 65+) 11/16/2018   Influenza Whole 01/27/2010   PFIZER(Purple Top)SARS-COV-2 Vaccination 05/05/2019, 05/26/2019, 12/24/2019, 06/27/2020   Pneumococcal Conjugate-13 05/17/2014   Pneumococcal Polysaccharide-23 07/10/2008, 05/10/2017   Td 07/10/2008   Tdap 11/16/2018   Zoster, Live 07/10/2008   There are no preventive care reminders to display for this patient.     Past Medical History:  Diagnosis Date   ALLERGIC RHINITIS 11/24/2006   Allergy    ANXIETY 07/10/2008   DIABETES MELLITUS, TYPE II 11/24/2006   GERD (gastroesophageal reflux disease)    History of shingles    HYPERLIPIDEMIA 11/24/2006   Past Surgical History:  Procedure Laterality Date   BREAST BIOPSY Right 03/11/2015   benign   MOUTH SURGERY      reports that she has been smoking cigarettes. She has a 23.50 pack-year smoking history. She has never used smokeless tobacco. She reports that she does not drink alcohol and does not use  drugs. family history includes Appendicitis in her father; Colon cancer (age of onset: 75) in her sister; Colon cancer (age of onset: 53) in her sister; Heart disease in her sister; Hypertension in her father; Stroke in her mother. Allergies  Allergen Reactions   Doxycycline    Current Outpatient Medications on File Prior to Visit  Medication Sig Dispense Refill   aspirin 81 MG tablet Take 81 mg by mouth daily.     cholecalciferol (VITAMIN D3) 25 MCG (1000 UNIT) tablet Take 1,000 Units by mouth daily.     metFORMIN (GLUCOPHAGE-XR) 500 MG 24 hr tablet TAKE 3 TABLETS EVERY MORNING 270 tablet 3   simvastatin (ZOCOR) 20 MG tablet TAKE 1 TABLET BY MOUTH EVERY DAY IN THE EVENING 90 tablet 3   Lancets MISC Apply 1 Device topically daily. E11.9 (Patient not taking: Reported on 05/26/2021) 100 each 3   No current facility-administered medications on file prior to visit.        ROS:  All others reviewed and negative.  Objective        PE:  BP 132/72 (BP Location: Right Arm, Patient Position: Sitting, Cuff Size: Large)    Pulse 74    Temp 98.4 F (36.9 C) (Oral)    Ht 5\' 4"  (1.626 m)    Wt 175 lb  12.8 oz (79.7 kg)    SpO2 97%    BMI 30.18 kg/m                 Constitutional: Pt appears in NAD               HENT: Head: NCAT.                Right Ear: External ear normal.                 Left Ear: External ear normal.                Eyes: . Pupils are equal, round, and reactive to light. Conjunctivae and EOM are normal               Nose: without d/c or deformity               Neck: Neck supple. Gross normal ROM               Cardiovascular: Normal rate and regular rhythm.                 Pulmonary/Chest: Effort normal and breath sounds without rales or wheezing.                Abd:  Soft, NT, ND, + BS, no organomegaly               Neurological: Pt is alert. At baseline orientation, motor grossly intact               Skin: Skin is warm. No rashes, no other new lesions, LE edema - none                Psychiatric: Pt behavior is normal without agitation   Micro: none  Cardiac tracings I have personally interpreted today:  none  Pertinent Radiological findings (summarize): none   Lab Results  Component Value Date   WBC 7.3 05/22/2021   HGB 13.7 05/22/2021   HCT 42.8 05/22/2021   PLT 249.0 05/22/2021   GLUCOSE 105 (H) 05/22/2021   CHOL 155 05/22/2021   TRIG 86.0 05/22/2021   HDL 68.60 05/22/2021   LDLDIRECT 99.9 03/07/2007   LDLCALC 69 05/22/2021   ALT 11 05/22/2021   AST 16 05/22/2021   NA 139 05/22/2021   K 4.0 05/22/2021   CL 103 05/22/2021   CREATININE 0.96 05/22/2021   BUN 19 05/22/2021   CO2 32 05/22/2021   TSH 1.27 05/22/2021   HGBA1C 6.6 (H) 05/22/2021   MICROALBUR 10.5 (H) 11/21/2020   Assessment/Plan:  Brittany Oconnor is a 76 y.o. Black or African American [2] female with  has a past medical history of ALLERGIC RHINITIS (11/24/2006), Allergy, ANXIETY (07/10/2008), DIABETES MELLITUS, TYPE II (11/24/2006), GERD (gastroesophageal reflux disease), History of shingles, and HYPERLIPIDEMIA (11/24/2006).  Smoker Pt counsled to quit, pt not ready  Encounter for well adult exam with abnormal findings Age and sex appropriate education and counseling updated with regular exercise and diet Referrals for preventative services - none needed Immunizations addressed - declines covid booster, shingrix, flu shot Smoking counseling  - counsled to quit smoking, pt not ready Evidence for depression or other mood disorder - chronic anxiety no change Most recent labs reviewed. I have personally reviewed and have noted: 1) the patient's medical and social history 2) The patient's current medications and supplements 3) The patient's height, weight, and BMI have been recorded in the chart   Diabetes  Lab Results  Component Value Date   HGBA1C 6.6 (H) 05/22/2021   Stable, pt to continue current medical treatment metformin   Hyperlipidemia Lab Results  Component Value Date    LDLCALC 69 05/22/2021   Stable, pt to continue current statin zocor 20   Vitamin D deficiency Last vitamin D Lab Results  Component Value Date   VD25OH 57.54 05/22/2021   Stable, cont oral replacement   B12 deficiency Lab Results  Component Value Date   VITAMINB12 224 05/22/2021   Low, to start oral replacement - b12 1000 mcg qd   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  Oliver Barre, MD 05/26/2021 9:51 PM [facility]

## 2021-05-26 NOTE — Patient Instructions (Signed)
Please remember to call for your colonsocopy as you are due  Please take the B12 1000 mcg per day (OTC) for 6 months  Please continue all other medications as before, and refills have been done if requested.  Please have the pharmacy call with any other refills you may need.  Please continue your efforts at being more active, low cholesterol diet, and weight control.  You are otherwise up to date with prevention measures today.  Please keep your appointments with your specialists as you may have planned  Please make an Appointment to return for your 1 year visit, or sooner if needed, with Lab testing by Appointment as well, to be done about 3-5 days before at the FIRST FLOOR Lab (so this is for TWO appointments - please see the scheduling desk as you leave)   Due to the ongoing Covid 19 pandemic, our lab now requires an appointment for any labs done at our office.  If you need labs done and do not have an appointment, please call our office ahead of time to schedule before presenting to the lab for your testing.

## 2021-06-18 DIAGNOSIS — H2512 Age-related nuclear cataract, left eye: Secondary | ICD-10-CM | POA: Diagnosis not present

## 2021-06-19 DIAGNOSIS — H25812 Combined forms of age-related cataract, left eye: Secondary | ICD-10-CM | POA: Diagnosis not present

## 2021-09-26 ENCOUNTER — Other Ambulatory Visit: Payer: Self-pay | Admitting: Internal Medicine

## 2021-09-26 NOTE — Telephone Encounter (Signed)
Please refill as per office routine med refill policy (all routine meds to be refilled for 3 mo or monthly (per pt preference) up to one year from last visit, then month to month grace period for 3 mo, then further med refills will have to be denied) ? ?

## 2021-11-16 ENCOUNTER — Telehealth: Payer: Self-pay

## 2021-11-16 ENCOUNTER — Ambulatory Visit (INDEPENDENT_AMBULATORY_CARE_PROVIDER_SITE_OTHER): Payer: Medicare PPO

## 2021-11-16 DIAGNOSIS — Z0000 Encounter for general adult medical examination without abnormal findings: Secondary | ICD-10-CM

## 2021-11-16 NOTE — Patient Instructions (Signed)
Ms. Bachicha , Thank you for taking time to come for your Medicare Wellness Visit. I appreciate your ongoing commitment to your health goals. Please review the following plan we discussed and let me know if I can assist you in the future.   Screening recommendations/referrals: Colonoscopy: 06/17/2015, DUE EVERY 10 YEARS Mammogram: 01/14/2021; DUE EVERY YEAR Bone Density: 08/24/2016; DUE EVERY 5 YEARS Recommended yearly ophthalmology/optometry visit for glaucoma screening and checkup Recommended yearly dental visit for hygiene and checkup  Vaccinations: Influenza vaccine: DUE FALL SEASON 2023 Pneumococcal vaccine: 05/17/2014, 05/10/2017 Tdap vaccine: 11/16/2018; DUE EVERY  10 YEARS Shingles vaccine: DUE    Covid-19: 05/05/2019, 05/26/2019, 12/24/2019, 06/27/2020  Advanced directives: YES; Please bring a copy of your health care power of attorney and living will to the office at your convenience.  Conditions/risks identified: YES; QUIT SMOKING  Next appointment: Please schedule your next Medicare Wellness Visit with your Nurse Health Advisor in 1 year by calling [phone].   Preventive Care 31 Years and Older, Female Preventive care refers to lifestyle choices and visits with your health care provider that can promote health and wellness. What does preventive care include? A yearly physical exam. This is also called an annual well check. Dental exams once or twice a year. Routine eye exams. Ask your health care provider how often you should have your eyes checked. Personal lifestyle choices, including: Daily care of your teeth and gums. Regular physical activity. Eating a healthy diet. Avoiding tobacco and drug use. Limiting alcohol use. Practicing safe sex. Taking low-dose aspirin every day. Taking vitamin and mineral supplements as recommended by your health care provider. What happens during an annual well check? The services and screenings done by your health care provider during your  annual well check will depend on your age, overall health, lifestyle risk factors, and family history of disease. Counseling  Your health care provider may ask you questions about your: Alcohol use. Tobacco use. Drug use. Emotional well-being. Home and relationship well-being. Sexual activity. Eating habits. History of falls. Memory and ability to understand (cognition). Work and work Astronomer. Reproductive health. Screening  You may have the following tests or measurements: Height, weight, and BMI. Blood pressure. Lipid and cholesterol levels. These may be checked every 5 years, or more frequently if you are over 53 years old. Skin check. Lung cancer screening. You may have this screening every year starting at age 88 if you have a 30-pack-year history of smoking and currently smoke or have quit within the past 15 years. Fecal occult blood test (FOBT) of the stool. You may have this test every year starting at age 49. Flexible sigmoidoscopy or colonoscopy. You may have a sigmoidoscopy every 5 years or a colonoscopy every 10 years starting at age 3. Hepatitis C blood test. Hepatitis B blood test. Sexually transmitted disease (STD) testing. Diabetes screening. This is done by checking your blood sugar (glucose) after you have not eaten for a while (fasting). You may have this done every 1-3 years. Bone density scan. This is done to screen for osteoporosis. You may have this done starting at age 10. Mammogram. This may be done every 1-2 years. Talk to your health care provider about how often you should have regular mammograms. Talk with your health care provider about your test results, treatment options, and if necessary, the need for more tests. Vaccines  Your health care provider may recommend certain vaccines, such as: Influenza vaccine. This is recommended every year. Tetanus, diphtheria, and acellular pertussis (Tdap, Td)  vaccine. You may need a Td booster every 10  years. Zoster vaccine. You may need this after age 48. Pneumococcal 13-valent conjugate (PCV13) vaccine. One dose is recommended after age 27. Pneumococcal polysaccharide (PPSV23) vaccine. One dose is recommended after age 83. Talk to your health care provider about which screenings and vaccines you need and how often you need them. This information is not intended to replace advice given to you by your health care provider. Make sure you discuss any questions you have with your health care provider. Document Released: 04/11/2015 Document Revised: 12/03/2015 Document Reviewed: 01/14/2015 Elsevier Interactive Patient Education  2017 [place] Prevention in the Home Falls can cause injuries. They can happen to people of all ages. There are many things you can do to make your home safe and to help prevent falls. What can I do on the outside of my home? Regularly fix the edges of walkways and driveways and fix any cracks. Remove anything that might make you trip as you walk through a door, such as a raised step or threshold. Trim any bushes or trees on the path to your home. Use bright outdoor lighting. Clear any walking paths of anything that might make someone trip, such as rocks or tools. Regularly check to see if handrails are loose or broken. Make sure that both sides of any steps have handrails. Any raised decks and porches should have guardrails on the edges. Have any leaves, snow, or ice cleared regularly. Use sand or salt on walking paths during winter. Clean up any spills in your garage right away. This includes oil or grease spills. What can I do in the bathroom? Use night lights. Install grab bars by the toilet and in the tub and shower. Do not use towel bars as grab bars. Use non-skid mats or decals in the tub or shower. If you need to sit down in the shower, use a plastic, non-slip stool. Keep the floor dry. Clean up any water that spills on the floor as soon as it  happens. Remove soap buildup in the tub or shower regularly. Attach bath mats securely with double-sided non-slip rug tape. Do not have throw rugs and other things on the floor that can make you trip. What can I do in the bedroom? Use night lights. Make sure that you have a light by your bed that is easy to reach. Do not use any sheets or blankets that are too big for your bed. They should not hang down onto the floor. Have a firm chair that has side arms. You can use this for support while you get dressed. Do not have throw rugs and other things on the floor that can make you trip. What can I do in the kitchen? Clean up any spills right away. Avoid walking on wet floors. Keep items that you use a lot in easy-to-reach places. If you need to reach something above you, use a strong step stool that has a grab bar. Keep electrical cords out of the way. Do not use floor polish or wax that makes floors slippery. If you must use wax, use non-skid floor wax. Do not have throw rugs and other things on the floor that can make you trip. What can I do with my stairs? Do not leave any items on the stairs. Make sure that there are handrails on both sides of the stairs and use them. Fix handrails that are broken or loose. Make sure that handrails are as long  as the stairways. Check any carpeting to make sure that it is firmly attached to the stairs. Fix any carpet that is loose or worn. Avoid having throw rugs at the top or bottom of the stairs. If you do have throw rugs, attach them to the floor with carpet tape. Make sure that you have a light switch at the top of the stairs and the bottom of the stairs. If you do not have them, ask someone to add them for you. What else can I do to help prevent falls? Wear shoes that: Do not have high heels. Have rubber bottoms. Are comfortable and fit you well. Are closed at the toe. Do not wear sandals. If you use a stepladder: Make sure that it is fully opened.  Do not climb a closed stepladder. Make sure that both sides of the stepladder are locked into place. Ask someone to hold it for you, if possible. Clearly mark and make sure that you can see: Any grab bars or handrails. First and last steps. Where the edge of each step is. Use tools that help you move around (mobility aids) if they are needed. These include: Canes. Walkers. Scooters. Crutches. Turn on the lights when you go into a dark area. Replace any light bulbs as soon as they burn out. Set up your furniture so you have a clear path. Avoid moving your furniture around. If any of your floors are uneven, fix them. If there are any pets around you, be aware of where they are. Review your medicines with your doctor. Some medicines can make you feel dizzy. This can increase your chance of falling. Ask your doctor what other things that you can do to help prevent falls. This information is not intended to replace advice given to you by your health care provider. Make sure you discuss any questions you have with your health care provider. Document Released: 01/09/2009 Document Revised: 08/21/2015 Document Reviewed: 04/19/2014 Elsevier Interactive Patient Education  2017 [employer].

## 2021-11-16 NOTE — Telephone Encounter (Signed)
Patient is Brittany Oconnor.  Wants ton know should she make an appt or can Dr Jonny Ruiz refer her to testing or specialist?

## 2021-11-16 NOTE — Progress Notes (Signed)
I connected with Brittany Oconnor today by telephone and verified that I am speaking with the correct person using two identifiers. Location patient: home Location provider: work Persons participating in the virtual visit: patient, provider.   I discussed the limitations, risks, security and privacy concerns of performing an evaluation and management service by telephone and the availability of in person appointments. I also discussed with the patient that there may be a patient responsible charge related to this service. The patient expressed understanding and verbally consented to this telephonic visit.    Interactive audio and video telecommunications were attempted between this provider and patient, however failed, due to patient having technical difficulties OR patient did not have access to video capability.  We continued and completed visit with audio only.  Some vital signs may be absent or patient reported.   Time Spent with patient on telephone encounter: 30 minutes  Subjective:   Brittany Oconnor is a 76 y.o. female who presents for Medicare Annual (Subsequent) preventive examination.  Review of Systems     Cardiac Risk Factors include: advanced age (>35men, >60 women);diabetes mellitus;dyslipidemia;family history of premature cardiovascular disease;smoking/ tobacco exposure     Objective:    There were no vitals filed for this visit. There is no height or weight on file to calculate BMI.     11/16/2021    1:37 PM 11/13/2020   12:38 PM 05/28/2015    9:57 AM  Advanced Directives  Does Patient Have a Medical Advance Directive? No No No  Would patient like information on creating a medical advance directive? No - Patient declined No - Patient declined     Current Medications (verified) Outpatient Encounter Medications as of 11/16/2021  Medication Sig   aspirin 81 MG tablet Take 81 mg by mouth daily.   cholecalciferol (VITAMIN D3) 25 MCG (1000 UNIT) tablet Take 50 mcg by mouth  daily.   cyanocobalamin (VITAMIN B12) 1000 MCG tablet Take 1,000 mcg by mouth daily.   metFORMIN (GLUCOPHAGE-XR) 500 MG 24 hr tablet TAKE 3 TABLETS EVERY MORNING   simvastatin (ZOCOR) 20 MG tablet TAKE 1 TABLET BY MOUTH EVERY DAY IN THE EVENING   Lancets MISC Apply 1 Device topically daily. E11.9 (Patient not taking: Reported on 05/26/2021)   No facility-administered encounter medications on file as of 11/16/2021.    Allergies (verified) Doxycycline   History: Past Medical History:  Diagnosis Date   ALLERGIC RHINITIS 11/24/2006   Allergy    ANXIETY 07/10/2008   DIABETES MELLITUS, TYPE II 11/24/2006   GERD (gastroesophageal reflux disease)    History of shingles    HYPERLIPIDEMIA 11/24/2006   Past Surgical History:  Procedure Laterality Date   BREAST BIOPSY Right 03/11/2015   benign   MOUTH SURGERY     Family History  Problem Relation Age of Onset   Colon cancer Sister 72       deceased    Heart disease Sister        died of heart attack   Colon cancer Sister 75       alive   Hypertension Father    Appendicitis Father        rupture   Stroke Mother        stroke 59;died age 12   Social History   Socioeconomic History   Marital status: Married    Spouse name: Not on file   Number of children: 1   Years of education: Not on file   Highest education level: Not on file  Occupational History   Not on file  Tobacco Use   Smoking status: Every Day    Packs/day: 0.50    Years: 47.00    Total pack years: 23.50    Types: Cigarettes   Smokeless tobacco: Never   Tobacco comments:    0.5 ppd  Substance and Sexual Activity   Alcohol use: No    Alcohol/week: 0.0 standard drinks of alcohol   Drug use: No   Sexual activity: Not on file  Other Topics Concern   Not on file  Social History Narrative   Not on file   Social Determinants of Health   Financial Resource Strain: Low Risk  (11/16/2021)   Overall Financial Resource Strain (CARDIA)    Difficulty of Paying  Living Expenses: Not hard at all  Food Insecurity: No Food Insecurity (11/16/2021)   Hunger Vital Sign    Worried About Running Out of Food in the Last Year: Never true    Ran Out of Food in the Last Year: Never true  Transportation Needs: No Transportation Needs (11/16/2021)   PRAPARE - Administrator, Civil ServiceTransportation    Lack of Transportation (Medical): No    Lack of Transportation (Non-Medical): No  Physical Activity: Sufficiently Active (11/16/2021)   Exercise Vital Sign    Days of Exercise per Week: 5 days    Minutes of Exercise per Session: 30 min  Stress: No Stress Concern Present (11/16/2021)   [employer] - Occupational Stress Questionnaire    Feeling of Stress : Not at all  Social Connections: Socially Integrated (11/16/2021)   Social Connection and Isolation Panel [NHANES]    Frequency of Communication with Friends and Family: More than three times a week    Frequency of Social Gatherings with Friends and Family: More than three times a week    Attends Religious Services: More than 4 times per year    Active Member of [employer]ubs or Organizations: Yes    Attends Engineer, structuralClub or Organization Meetings: More than 4 times per year    Marital Status: Married    Tobacco Counseling Ready to quit: Not Answered Counseling given: Not Answered Tobacco comments: 0.5 ppd   Clinical Intake:  Pre-visit preparation completed: Yes  Pain : No/denies pain     Nutritional Risks: None Diabetes: Yes CBG done?: No Did pt. bring in CBG monitor from home?: No  How often do you need to have someone help you when you read instructions, pamphlets, or other written materials from your doctor or pharmacy?: 1 - Never What is the last grade level you completed in school?: 13 YEARS OF EDUCATION  Diabetic? YES  Interpreter Needed?: No  Information entered by :: Jmichael Gille, LPN.   Activities of Daily Living    11/16/2021    1:49 PM  In your present state of health, do you have any  difficulty performing the following activities:  Hearing? 0  Vision? 0  Difficulty concentrating or making decisions? 0  Walking or climbing stairs? 0  Dressing or bathing? 0  Doing errands, shopping? 0  Preparing Food and eating ? N  Using the Toilet? N  In the past six months, have you accidently leaked urine? N  Do you have problems with loss of bowel control? N  Managing your Medications? N  Managing your Finances? N  Housekeeping or managing your Housekeeping? N    Patient Care Team: Corwin LevinsJohn, James W, MD as PCP - General  Indicate any recent Medical Services you may have received from  other than [facility] providers in the past year (date may be approximate).     Assessment:   This is a routine wellness examination for Brittany Oconnor.  Hearing/Vision screen Hearing Screening - Comments:: Patient denied any hearing difficulty.   No hearing aids.  Vision Screening - Comments:: Patient does wear readers.  Eye exam done by: Olivia Canter, MD   Dietary issues and exercise activities discussed: Current Exercise Habits: Home exercise routine, Type of exercise: walking;Other - see comments (DANCING), Time (Minutes): 30, Frequency (Times/Week): 5, Weekly Exercise (Minutes/Week): 150, Intensity: Moderate, Exercise limited by: respiratory conditions(s)   Goals Addressed             This Visit's Progress    MY GOAL IS TO QUIT SMOKING AND LOSE A FEW POUNDS.        Depression Screen    11/16/2021    1:49 PM 05/26/2021    1:48 PM 11/25/2020   11:40 AM 11/13/2020   12:44 PM 05/26/2020    2:26 PM 05/26/2020    1:46 PM 05/23/2019   10:32 AM  PHQ 2/9 Scores  PHQ - 2 Score 0 0 0 0 0 0 0    Fall Risk    11/16/2021    1:38 PM 05/26/2021    2:07 PM 05/26/2021    1:47 PM 11/25/2020   11:40 AM 11/13/2020   12:43 PM  Fall Risk   Falls in the past year? 0 0 0 0 0  Number falls in past yr: 0 0 0 0 0  Injury with Fall? 0 0 0 0 0  Risk for fall due to : No Fall Risks    No Fall Risks  Follow  up Falls evaluation completed    Falls evaluation completed    FALL RISK PREVENTION PERTAINING TO THE HOME:  Any stairs in or around the home? Yes  If so, are there any without handrails? No  Home free of loose throw rugs in walkways, pet beds, electrical cords, etc? Yes  Adequate lighting in your home to reduce risk of falls? Yes   ASSISTIVE DEVICES UTILIZED TO PREVENT FALLS:  Life alert? No  Use of a cane, walker or w/c? No  Grab bars in the bathroom? Yes  Shower chair or bench in shower? Yes  Elevated toilet seat or a handicapped toilet? No   TIMED UP AND GO:  Was the test performed? No .  Length of time to ambulate 10 feet: N/A sec.   Appearance of gait: Gait not evaluated during this visit.  Cognitive Function:        11/16/2021    1:50 PM  6CIT Screen  What Year? 0 points  What month? 0 points  What time? 0 points  Count back from 20 0 points  Months in reverse 0 points  Repeat phrase 0 points  Total Score 0 points    Immunizations Immunization History  Administered Date(s) Administered   Fluad Quad(high Dose 65+) 11/16/2018   Influenza Whole 01/27/2010   PFIZER(Purple Top)SARS-COV-2 Vaccination 05/05/2019, 05/26/2019, 12/24/2019, 06/27/2020   Pneumococcal Conjugate-13 05/17/2014   Pneumococcal Polysaccharide-23 07/10/2008, 05/10/2017   Td 07/10/2008   Tdap 11/16/2018   Zoster, Live 07/10/2008    TDAP status: Up to date  Flu Vaccine status: Due, Education has been provided regarding the importance of this vaccine. Advised may receive this vaccine at local pharmacy or Health Dept. Aware to provide a copy of the vaccination record if obtained from local pharmacy or Health Dept. Verbalized acceptance  and understanding.  Pneumococcal vaccine status: Up to date  Covid-19 vaccine status: Completed vaccines  Qualifies for Shingles Vaccine? Yes   Zostavax completed Yes   Shingrix Completed?: No.    Education has been provided regarding the importance of  this vaccine. Patient has been advised to call insurance company to determine out of pocket expense if they have not yet received this vaccine. Advised may also receive vaccine at local pharmacy or Health Dept. Verbalized acceptance and understanding.  Screening Tests Health Maintenance  Topic Date Due   Zoster Vaccines- Shingrix (1 of 2) Never done   COVID-19 Vaccine (5 - Pfizer risk series) 08/22/2020   INFLUENZA VACCINE  10/27/2021   Diabetic kidney evaluation - Urine ACR  11/21/2021   HEMOGLOBIN A1C  11/19/2021   OPHTHALMOLOGY EXAM  11/20/2021   Diabetic kidney evaluation - GFR measurement  05/22/2022   FOOT EXAM  05/26/2022   TETANUS/TDAP  11/15/2028   Pneumonia Vaccine 79+ Years old  Completed   DEXA SCAN  Completed   Hepatitis C Screening  Completed   HPV VACCINES  Aged Out   COLONOSCOPY (Pts 45-40yrs Insurance coverage will need to be confirmed)  Discontinued    Health Maintenance  Health Maintenance Due  Topic Date Due   Zoster Vaccines- Shingrix (1 of 2) Never done   COVID-19 Vaccine (5 - Pfizer risk series) 08/22/2020   INFLUENZA VACCINE  10/27/2021   Diabetic kidney evaluation - Urine ACR  11/21/2021    Colorectal cancer screening: Type of screening: Colonoscopy. Completed 06/17/2015. Repeat every 5 years  Mammogram status: Completed 01/14/2021. Repeat every year  Bone Density status: Completed 08/24/2016. Results reflect: Bone density results: NORMAL. Repeat every 5 years.  Lung Cancer Screening: (Low Dose CT Chest recommended if Age 21-80 years, 30 pack-year currently smoking OR have quit w/in 15years.) does qualify.   Lung Cancer Screening Referral: NO; ALREADY A PULMO PATIENT; GETS CT SCAN ONCE EVERY YEAR.  Additional Screening:  Hepatitis C Screening: YES qualify; Completed 05/10/2017  Vision Screening: Recommended annual ophthalmology exams for early detection of glaucoma and other disorders of the eye. Is the patient up to date with their annual eye exam?   Yes  Who is the provider or what is the name of the office in which the patient attends annual eye exams? Baldwin Jamaica, MD. If pt is not established with a provider, would they like to be referred to a provider to establish care? No .   Dental Screening: Recommended annual dental exams for proper oral hygiene  Community Resource Referral / Chronic Care Management: CRR required this visit?  No   CCM required this visit?  No      Plan:     I have personally reviewed and noted the following in the patient's chart:   Medical and social history Use of alcohol, tobacco or illicit drugs  Current medications and supplements including opioid prescriptions. Patient is not currently taking opioid prescriptions. Functional ability and status Nutritional status Physical activity Advanced directives List of other physicians Hospitalizations, surgeries, and ER visits in previous 12 months Vitals Screenings to include cognitive, depression, and falls Referrals and appointments  In addition, I have reviewed and discussed with patient certain preventive protocols, quality metrics, and best practice recommendations. A written personalized care plan for preventive services as well as general preventive health recommendations were provided to patient.     Mickeal Needy, LPN   9/37/1696   Nurse Notes:  Patient is cogitatively intact. There  were no vitals filed for this visit. There is no height or weight on file to calculate BMI. Patient stated that she has no issues with gait or balance; does not use any assistive devices. Medications reviewed with patient; no opioid use noted.

## 2021-11-24 NOTE — Telephone Encounter (Signed)
Called pt, LVM to schedule OV

## 2021-12-04 ENCOUNTER — Ambulatory Visit (INDEPENDENT_AMBULATORY_CARE_PROVIDER_SITE_OTHER): Payer: Medicare PPO | Admitting: Internal Medicine

## 2021-12-04 VITALS — BP 134/76 | HR 108 | Temp 98.1°F | Ht 64.0 in | Wt 175.0 lb

## 2021-12-04 DIAGNOSIS — R059 Cough, unspecified: Secondary | ICD-10-CM

## 2021-12-04 DIAGNOSIS — F17200 Nicotine dependence, unspecified, uncomplicated: Secondary | ICD-10-CM | POA: Diagnosis not present

## 2021-12-04 DIAGNOSIS — E538 Deficiency of other specified B group vitamins: Secondary | ICD-10-CM | POA: Diagnosis not present

## 2021-12-04 DIAGNOSIS — E7800 Pure hypercholesterolemia, unspecified: Secondary | ICD-10-CM | POA: Diagnosis not present

## 2021-12-04 DIAGNOSIS — E1165 Type 2 diabetes mellitus with hyperglycemia: Secondary | ICD-10-CM | POA: Diagnosis not present

## 2021-12-04 DIAGNOSIS — R0989 Other specified symptoms and signs involving the circulatory and respiratory systems: Secondary | ICD-10-CM

## 2021-12-04 DIAGNOSIS — E559 Vitamin D deficiency, unspecified: Secondary | ICD-10-CM

## 2021-12-04 LAB — POCT GLYCOSYLATED HEMOGLOBIN (HGB A1C): Hemoglobin A1C: 6.3 % — AB (ref 4.0–5.6)

## 2021-12-04 LAB — POC SOFIA SARS ANTIGEN FIA: SARS Coronavirus 2 Ag: NEGATIVE

## 2021-12-04 NOTE — Progress Notes (Unsigned)
Patient ID: Brittany Oconnor, female   DOB: 1945/05/31, 76 y.o.   MRN: [id number]        Chief Complaint: follow up HTN, HLD and hyperglycemia , and non prod cough       HPI:  Brittany Oconnor is a 76 y.o. female here overall doing ok; Pt denies chest pain, increased sob or doe, wheezing, orthopnea, PND, increased LE swelling, palpitations, dizziness or syncope.   Pt denies polydipsia, polyuria, or new focal neuro s/s.    Pt denies fever, wt loss, night sweats, loss of appetite, or other constitutional symptoms, but does have a cough non prod new onset x 3 days after just returned from [place] in a car x 8 hrs with a person later found to be covid pos.         Wt Readings from Last 3 Encounters:  12/04/21 175 lb (79.4 kg)  05/26/21 175 lb 12.8 oz (79.7 kg)  11/25/20 174 lb (78.9 kg)   BP Readings from Last 3 Encounters:  12/04/21 134/76  05/26/21 132/72  11/25/20 122/70         Past Medical History:  Diagnosis Date   ALLERGIC RHINITIS 11/24/2006   Allergy    ANXIETY 07/10/2008   DIABETES MELLITUS, TYPE II 11/24/2006   GERD (gastroesophageal reflux disease)    History of shingles    HYPERLIPIDEMIA 11/24/2006   Past Surgical History:  Procedure Laterality Date   BREAST BIOPSY Right 03/11/2015   benign   MOUTH SURGERY      reports that she has been smoking cigarettes. She has a 23.50 pack-year smoking history. She has never used smokeless tobacco. She reports that she does not drink alcohol and does not use drugs. family history includes Appendicitis in her father; Colon cancer (age of onset: 87) in her sister; Colon cancer (age of onset: 72) in her sister; Heart disease in her sister; Hypertension in her father; Stroke in her mother. Allergies  Allergen Reactions   Doxycycline    Current Outpatient Medications on File Prior to Visit  Medication Sig Dispense Refill   aspirin 81 MG tablet Take 81 mg by mouth daily.     cholecalciferol (VITAMIN D3) 25 MCG (1000 UNIT) tablet Take 50 mcg  by mouth daily.     cyanocobalamin (VITAMIN B12) 1000 MCG tablet Take 1,000 mcg by mouth daily.     metFORMIN (GLUCOPHAGE-XR) 500 MG 24 hr tablet TAKE 3 TABLETS EVERY MORNING 270 tablet 3   simvastatin (ZOCOR) 20 MG tablet TAKE 1 TABLET BY MOUTH EVERY DAY IN THE EVENING 90 tablet 3   Lancets MISC Apply 1 Device topically daily. E11.9 (Patient not taking: Reported on 05/26/2021) 100 each 3   No current facility-administered medications on file prior to visit.        ROS:  All others reviewed and negative.  Objective        PE:  BP 134/76 (BP Location: Right Arm, Patient Position: Sitting, Cuff Size: Large)   Pulse (!) 108   Temp 98.1 F (36.7 C) (Oral)   Ht 5\' 4"  (1.626 m)   Wt 175 lb (79.4 kg)   SpO2 94%   BMI 30.04 kg/m                 Constitutional: Pt appears in NAD               HENT: Head: NCAT.                Right  Ear: External ear normal.                 Left Ear: External ear normal.                Eyes: . Pupils are equal, round, and reactive to light. Conjunctivae and EOM are normal               Nose: without d/c or deformity               Neck: Neck supple. Gross normal ROM               Cardiovascular: Normal rate and regular rhythm.                 Pulmonary/Chest: Effort normal and breath sounds without rales or wheezing.                Abd:  Soft, NT, ND, + BS, no organomegaly               Neurological: Pt is alert. At baseline orientation, motor grossly intact               Skin: Skin is warm. No rashes, no other new lesions, LE edema -  none               Psychiatric: Pt behavior is normal without agitation   Micro: none  Cardiac tracings I have personally interpreted today:  none  Pertinent Radiological findings (summarize): none   Lab Results  Component Value Date   WBC 7.3 05/22/2021   HGB 13.7 05/22/2021   HCT 42.8 05/22/2021   PLT 249.0 05/22/2021   GLUCOSE 105 (H) 05/22/2021   CHOL 155 05/22/2021   TRIG 86.0 05/22/2021   HDL 68.60  05/22/2021   LDLDIRECT 99.9 03/07/2007   LDLCALC 69 05/22/2021   ALT 11 05/22/2021   AST 16 05/22/2021   NA 139 05/22/2021   K 4.0 05/22/2021   CL 103 05/22/2021   CREATININE 0.96 05/22/2021   BUN 19 05/22/2021   CO2 32 05/22/2021   TSH 1.27 05/22/2021   HGBA1C 6.3 (A) 12/04/2021   MICROALBUR 10.5 (H) 11/21/2020   Hemoglobin A1C 4.0 - 5.6 % 6.3 Abnormal   6.6 High  R, CM  6.7 High  R, CM  6.6   Assessment/Plan:  Brittany Oconnor is a 76 y.o. Black or African American [2] female with  has a past medical history of ALLERGIC RHINITIS (11/24/2006), Allergy, ANXIETY (07/10/2008), DIABETES MELLITUS, TYPE II (11/24/2006), GERD (gastroesophageal reflux disease), History of shingles, and HYPERLIPIDEMIA (11/24/2006).  Diabetes Lab Results  Component Value Date   HGBA1C 6.3 (A) 12/04/2021   Stable, pt to continue current medical treatment metformin ER 500 mg - 3 qd   Hyperlipidemia Lab Results  Component Value Date   LDLCALC 69 05/22/2021   Stable, pt to continue current statin zocor 20 mg qd   Cough ? Worse last few days, exam benign but did have exposure to covid approx 3 days agol pt is COVID neg today, but advised to recheck in the next 1-2 days  Followup: Return in about 6 months (around 06/04/2022).  Oliver Barre, MD 12/06/2021 3:07 PM [facility]

## 2021-12-04 NOTE — Patient Instructions (Signed)
Your COVID test was done today  Your A1c was done today  Please continue all other medications as before, and refills have been done if requested.  Please have the pharmacy call with any other refills you may need.  Please continue your efforts at being more active, low cholesterol diet, and weight control.  You are otherwise up to date with prevention measures today.  Please keep your appointments with your specialists as you may have planned  Please make an Appointment to return in 6 months, or sooner if needed, also with Lab Appointment for testing done 3-5 days before at the FIRST FLOOR Lab (so this is for TWO appointments - please see the scheduling desk as you leave)

## 2021-12-06 ENCOUNTER — Encounter: Payer: Self-pay | Admitting: Internal Medicine

## 2021-12-06 NOTE — Assessment & Plan Note (Signed)
Lab Results  Component Value Date   LDLCALC 69 05/22/2021   Stable, pt to continue current statin zocor 20 mg qd

## 2021-12-06 NOTE — Assessment & Plan Note (Signed)
?   Worse last few days, exam benign but did have exposure to covid approx 3 days agol pt is COVID neg today, but advised to recheck in the next 1-2 days

## 2021-12-06 NOTE — Assessment & Plan Note (Signed)
Pt again counsled to quit, pt not ready

## 2021-12-06 NOTE — Addendum Note (Signed)
Addended by: Corwin Levins on: 12/06/2021 03:10 PM   Modules accepted: Orders

## 2021-12-06 NOTE — Assessment & Plan Note (Signed)
Lab Results  Component Value Date   HGBA1C 6.3 (A) 12/04/2021   Stable, pt to continue current medical treatment metformin ER 500 mg - 3 qd

## 2021-12-21 ENCOUNTER — Other Ambulatory Visit: Payer: Self-pay | Admitting: Internal Medicine

## 2021-12-21 DIAGNOSIS — Z1231 Encounter for screening mammogram for malignant neoplasm of breast: Secondary | ICD-10-CM

## 2021-12-22 ENCOUNTER — Ambulatory Visit
Admission: RE | Admit: 2021-12-22 | Discharge: 2021-12-22 | Disposition: A | Discharge status: Home / Self Care | Payer: Medicare PPO | Source: Ambulatory Visit | Attending: Internal Medicine | Admitting: Internal Medicine

## 2021-12-22 DIAGNOSIS — F17210 Nicotine dependence, cigarettes, uncomplicated: Secondary | ICD-10-CM | POA: Diagnosis not present

## 2021-12-22 DIAGNOSIS — Z87891 Personal history of nicotine dependence: Secondary | ICD-10-CM

## 2021-12-24 ENCOUNTER — Other Ambulatory Visit: Payer: Self-pay | Admitting: Acute Care

## 2021-12-24 DIAGNOSIS — Z87891 Personal history of nicotine dependence: Secondary | ICD-10-CM

## 2021-12-24 DIAGNOSIS — F17210 Nicotine dependence, cigarettes, uncomplicated: Secondary | ICD-10-CM

## 2021-12-24 DIAGNOSIS — Z122 Encounter for screening for malignant neoplasm of respiratory organs: Secondary | ICD-10-CM

## 2021-12-25 ENCOUNTER — Other Ambulatory Visit: Payer: Self-pay | Admitting: Internal Medicine

## 2021-12-25 NOTE — Telephone Encounter (Signed)
Please refill as per office routine med refill policy (all routine meds to be refilled for 3 mo or monthly (per pt preference) up to one year from last visit, then month to month grace period for 3 mo, then further med refills will have to be denied) ? ?

## 2022-01-19 ENCOUNTER — Ambulatory Visit
Admission: RE | Admit: 2022-01-19 | Discharge: 2022-01-19 | Disposition: A | Discharge status: Home / Self Care | Payer: Medicare PPO | Source: Ambulatory Visit | Attending: Internal Medicine | Admitting: Internal Medicine

## 2022-01-19 DIAGNOSIS — Z1231 Encounter for screening mammogram for malignant neoplasm of breast: Secondary | ICD-10-CM

## 2022-02-18 IMAGING — DX DG FOOT COMPLETE 3+V*R*
3 series · 3 of 3 positions shown · non-contrast
Comparison: None.

CLINICAL DATA: 74-year-old female with pain in the right fourth
toe.

EXAM:
RIGHT FOOT COMPLETE - 3+ VIEW

[foot ap]
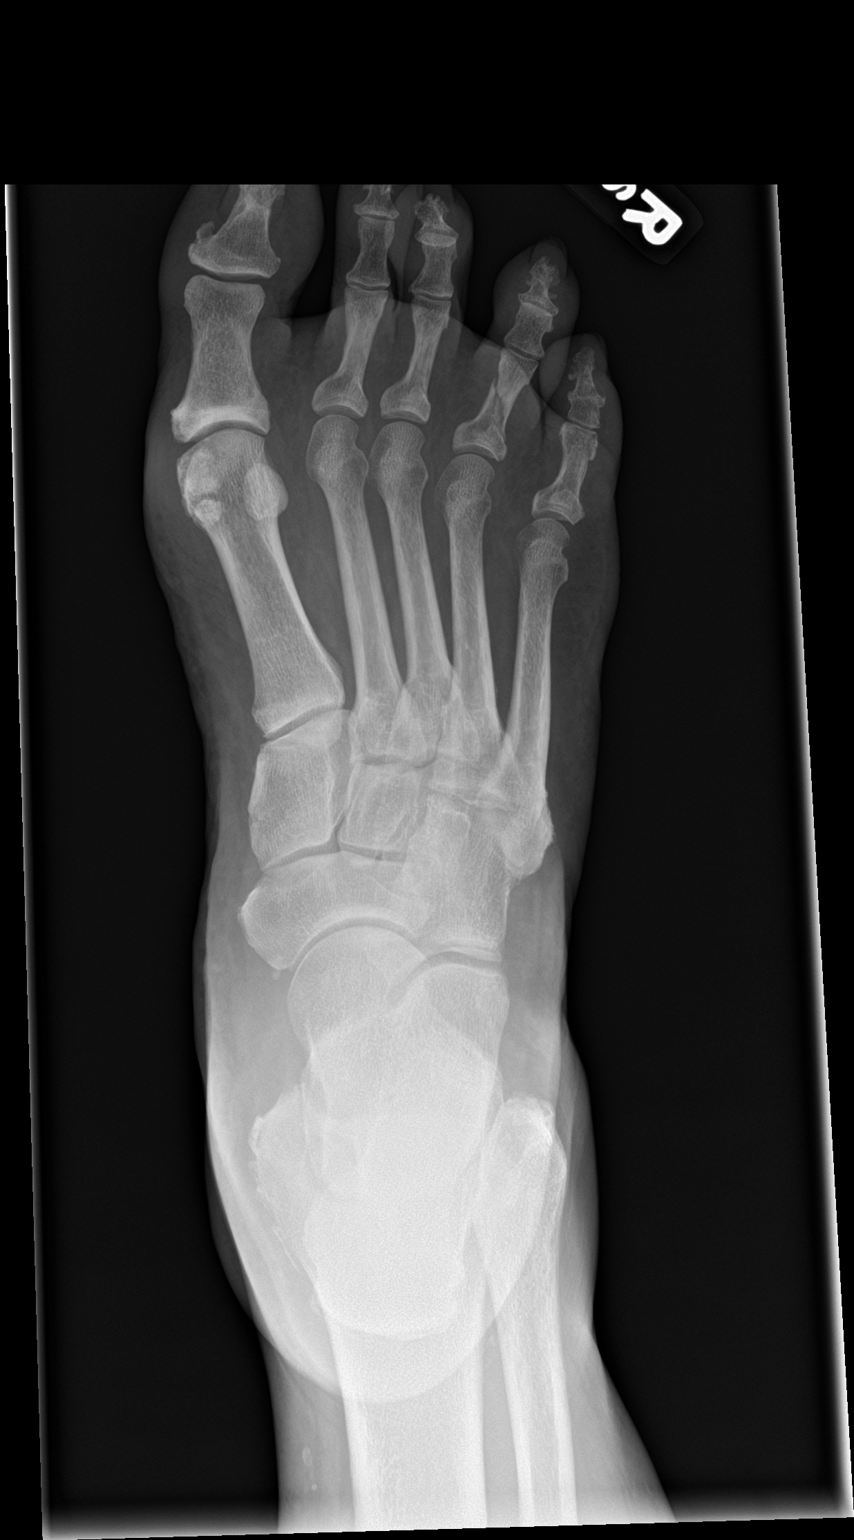

[foot obl]
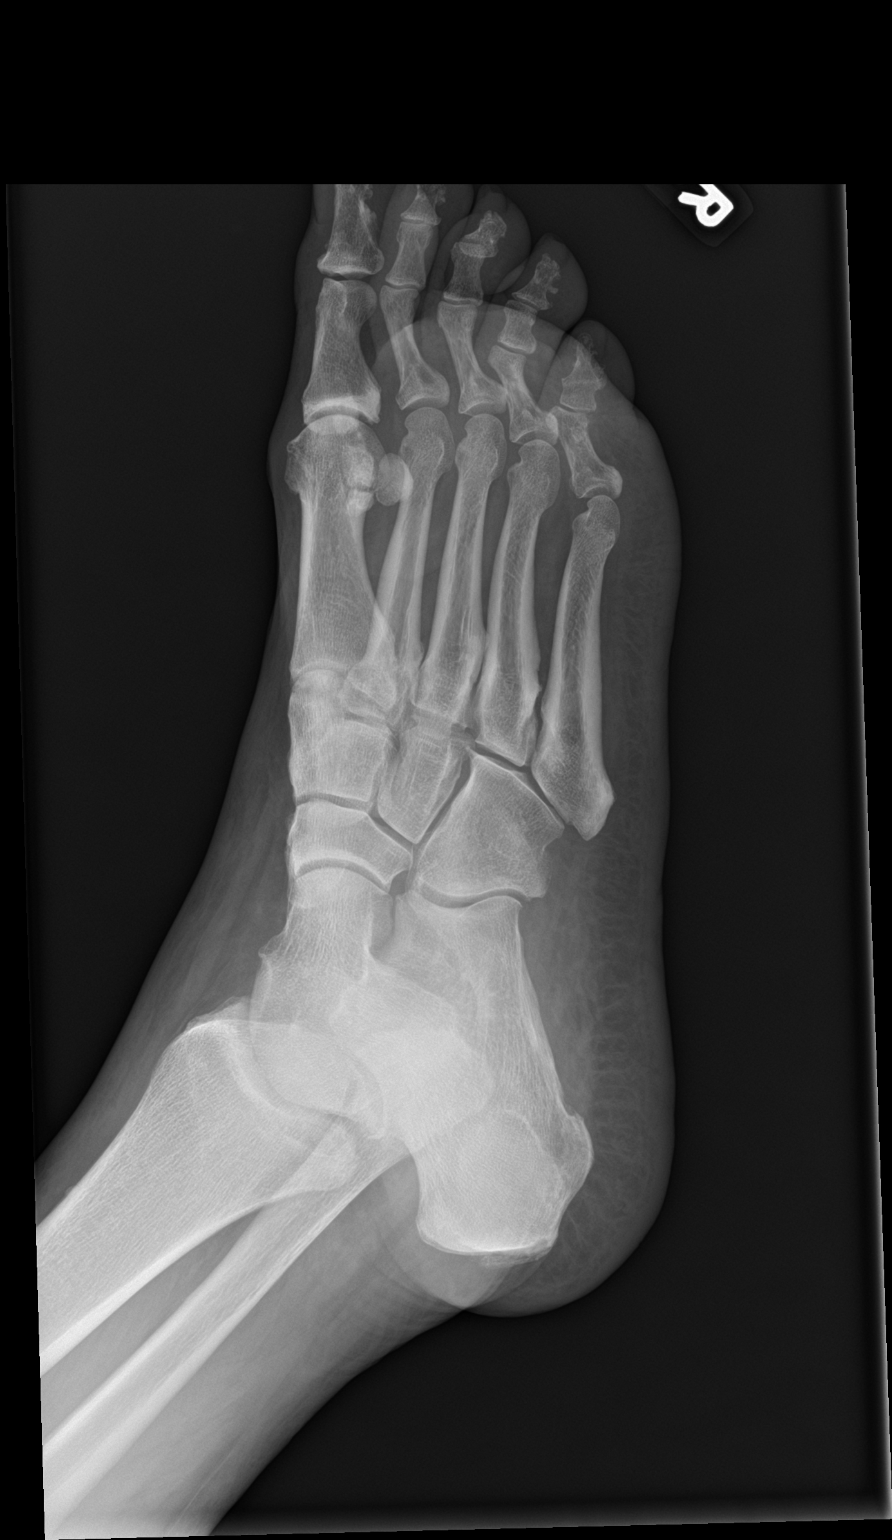

[foot lat]
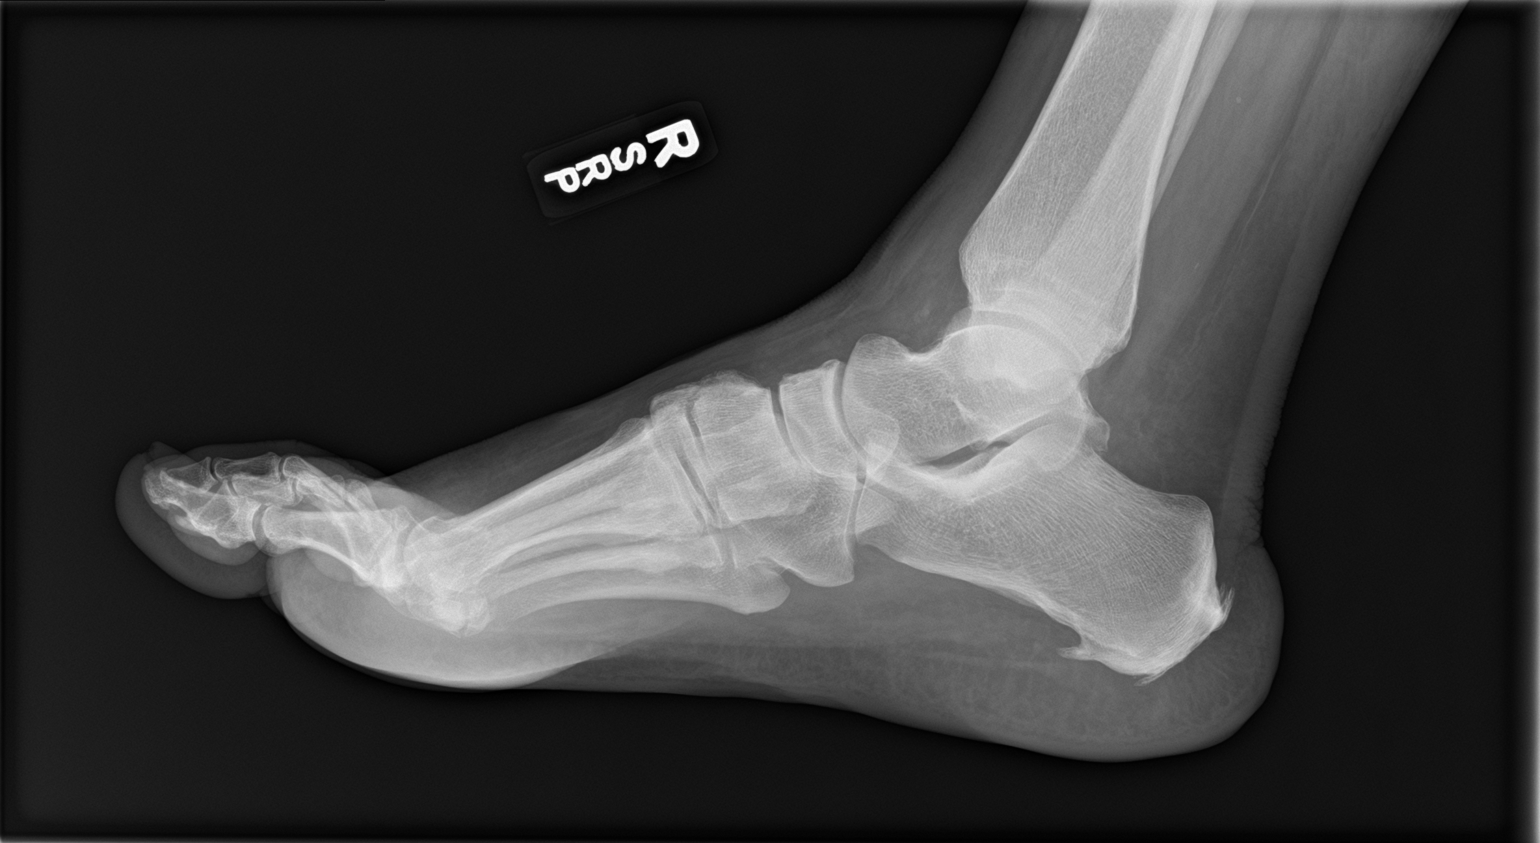

[3 of 3 positions shown; findings below may reference images not displayed]

FINDINGS: There is a nondisplaced oblique fracture of the midportion of the
proximal phalanx of the fourth toe. No dislocation. No significant
arthritic changes. Mild soft tissue swelling of the fourth toe. No
radiopaque foreign object or soft tissue gas.
IMPRESSION: Nondisplaced oblique fracture of the proximal phalanx of the fourth
toe.

## 2022-03-16 DIAGNOSIS — H43392 Other vitreous opacities, left eye: Secondary | ICD-10-CM | POA: Diagnosis not present

## 2022-03-16 DIAGNOSIS — H43811 Vitreous degeneration, right eye: Secondary | ICD-10-CM | POA: Diagnosis not present

## 2022-03-16 DIAGNOSIS — E119 Type 2 diabetes mellitus without complications: Secondary | ICD-10-CM | POA: Diagnosis not present

## 2022-03-16 DIAGNOSIS — Z961 Presence of intraocular lens: Secondary | ICD-10-CM | POA: Diagnosis not present

## 2022-03-19 ENCOUNTER — Encounter (HOSPITAL_COMMUNITY): Payer: Self-pay | Admitting: Emergency Medicine

## 2022-03-19 ENCOUNTER — Ambulatory Visit (INDEPENDENT_AMBULATORY_CARE_PROVIDER_SITE_OTHER): Payer: Medicare PPO

## 2022-03-19 ENCOUNTER — Ambulatory Visit (HOSPITAL_COMMUNITY)
Admission: EM | Admit: 2022-03-19 | Discharge: 2022-03-19 | Disposition: A | Discharge status: Home / Self Care | Payer: Medicare PPO | Attending: Family Medicine | Admitting: Family Medicine

## 2022-03-19 DIAGNOSIS — M79644 Pain in right finger(s): Secondary | ICD-10-CM | POA: Diagnosis not present

## 2022-03-19 DIAGNOSIS — M25562 Pain in left knee: Secondary | ICD-10-CM | POA: Diagnosis not present

## 2022-03-19 DIAGNOSIS — S62304A Unspecified fracture of fourth metacarpal bone, right hand, initial encounter for closed fracture: Secondary | ICD-10-CM

## 2022-03-19 DIAGNOSIS — W19XXXA Unspecified fall, initial encounter: Secondary | ICD-10-CM

## 2022-03-19 DIAGNOSIS — M79641 Pain in right hand: Secondary | ICD-10-CM | POA: Diagnosis not present

## 2022-03-19 DIAGNOSIS — M7989 Other specified soft tissue disorders: Secondary | ICD-10-CM | POA: Diagnosis not present

## 2022-03-19 DIAGNOSIS — S62309A Unspecified fracture of unspecified metacarpal bone, initial encounter for closed fracture: Secondary | ICD-10-CM | POA: Insufficient documentation

## 2022-03-19 MED ORDER — TRAMADOL HCL 50 MG PO TABS: 50.0000 mg | ORAL_TABLET | Freq: Four times a day (QID) | ORAL | 0 refills | Status: DC | PRN

## 2022-03-19 NOTE — ED Notes (Signed)
Called to make ortho aware of splint ordered.

## 2022-03-19 NOTE — Progress Notes (Signed)
Orthopedic Tech Progress Note Patient Details:  Brittany Oconnor 08-May-1945 [id number]  Ortho Devices Type of Ortho Device: Ulna gutter splint Ortho Device/Splint Location: RUE Ortho Device/Splint Interventions: Ordered, Adjustment, Application   Post Interventions Patient Tolerated: Well Instructions Provided: Care of device  [place] A Gerilyn Pilgrim 03/19/2022, 10:59 AM

## 2022-03-19 NOTE — ED Provider Notes (Signed)
[facility]    CSN: [id number] Arrival date & time: 03/19/22  0845      History   Chief Complaint Chief Complaint  Patient presents with   Fall   Hand Injury   Facial Injury   Knee Injury    HPI Brittany Oconnor is a 76 y.o. female.    Fall  Hand Injury Facial Injury  Here for abrasion to left temple, right hand pain and left knee pain.  Last night she was backing out of the backseat of her car(she had gone out to the car to get some wrapping paper that was in the floor of the backseat), when she missed the step down and fell onto brick and concrete.  She sustained an abrasion to her left brow and temporal area.  No loss of consciousness and no double vision  She also has some pain and swelling in her distal right hand and it is difficult to flex and extend her fingers  And she has some pain a little swelling and abrasion on her left anterior knee.  She takes aspirin but no other blood thinner.  Last tetanus was August 2020 Past Medical History:  Diagnosis Date   ALLERGIC RHINITIS 11/24/2006   Allergy    ANXIETY 07/10/2008   DIABETES MELLITUS, TYPE II 11/24/2006   GERD (gastroesophageal reflux disease)    History of shingles    HYPERLIPIDEMIA 11/24/2006    Patient Active Problem List   Diagnosis Date Noted   B12 deficiency 05/26/2021   Neuropathy 11/29/2020   Vitamin D deficiency 05/23/2019   Sinusitis 03/30/2019   Viral URI with cough 03/23/2018   Dysphagia 05/10/2017   COPD exacerbation (HCC) 05/10/2015   Cough 05/10/2015   Chest pain 05/10/2015   Skin tag 05/17/2014   Right carpal tunnel syndrome 10/13/2013   COPD (chronic obstructive pulmonary disease) with acute bronchitis (HCC) 10/13/2013   Obesity (BMI 30-39.9) 09/08/2013   Smoker 04/04/2013   Left leg swelling 12/13/2012   Family history of colon cancer 06/09/2011   Encounter for well adult exam with abnormal findings 04/29/2011   MENOPAUSAL DISORDER 03/18/2010   Anxiety state  07/10/2008   ELEVATED BLOOD PRESSURE WITHOUT DIAGNOSIS OF HYPERTENSION 05/24/2007   KNEE PAIN, LEFT 03/07/2007   Diabetes (HCC) 11/24/2006   Hyperlipidemia 11/24/2006   Allergic rhinitis 11/24/2006    Past Surgical History:  Procedure Laterality Date   BREAST BIOPSY Right 03/11/2015   benign   MOUTH SURGERY      OB History   No obstetric history on file.      Home Medications    Prior to Admission medications   Medication Sig Start Date End Date Taking? Authorizing Provider  traMADol (ULTRAM) 50 MG tablet Take 1 tablet (50 mg total) by mouth every 6 (six) hours as needed (pain). 03/19/22  Yes Zenia Resides, MD  aspirin 81 MG tablet Take 81 mg by mouth daily.    [provider]  cholecalciferol (VITAMIN D3) 25 MCG (1000 UNIT) tablet Take 50 mcg by mouth daily.    [provider]  cyanocobalamin (VITAMIN B12) 1000 MCG tablet Take 1,000 mcg by mouth daily.    [provider]  Lancets MISC Apply 1 Device topically daily. E11.9 Patient not taking: Reported on 05/26/2021 05/23/19   Corwin Levins, MD  metFORMIN (GLUCOPHAGE-XR) 500 MG 24 hr tablet TAKE 3 TABLETS BY MOUTH EVERY MORNING 12/25/21   Corwin Levins, MD  simvastatin (ZOCOR) 20 MG tablet TAKE 1 TABLET  BY MOUTH EVERY DAY IN THE EVENING 09/30/21   Corwin LevinsJohn, James W, MD    Family History Family History  Problem Relation Age of Onset   Colon cancer Sister 6067       deceased    Heart disease Sister        died of heart attack   Colon cancer Sister [id number]       alive   Hypertension Father    Appendicitis Father        rupture   Stroke Mother        stroke 59;died age 77    Social History Social History   Tobacco Use   Smoking status: Every Day    Packs/day: 0.50    Years: 47.00    Total pack years: 23.50    Types: Cigarettes   Smokeless tobacco: Never   Tobacco comments:    0.5 ppd  Substance Use Topics   Alcohol use: No    Alcohol/week: 0.0 standard drinks of alcohol   Drug use: No      Allergies   Doxycycline   Review of Systems Review of Systems   Physical Exam Triage Vital Signs ED Triage Vitals  Enc Vitals Group     BP 03/19/22 0916 (!) 174/92     Pulse Rate 03/19/22 0916 (!) 107     Resp 03/19/22 0916 19     Temp 03/19/22 0916 97.7 F (36.5 C)     Temp Source 03/19/22 0916 Oral     SpO2 03/19/22 0916 97 %     Weight --      Height --      Head Circumference --      Peak Flow --      Pain Score 03/19/22 0915 6     Pain Loc --      Pain Edu? --      Excl. in GC? --    No data found.  Updated Vital Signs BP (!) 174/92 (BP Location: Left Arm)   Pulse (!) 107   Temp 97.7 F (36.5 C) (Oral)   Resp 19   SpO2 97%   Visual Acuity Right Eye Distance:   Left Eye Distance:   Bilateral Distance:    Right Eye Near:   Left Eye Near:    Bilateral Near:     Physical Exam Vitals reviewed.  Constitutional:      General: She is not in acute distress.    Appearance: She is not ill-appearing, toxic-appearing or diaphoretic.  HENT:     Head:     Comments: There is an abrasion starting at the lateral eyebrow and extending onto her left temporal area and onto her cheek.  She has little swelling on her left upper cheek.  Extraocular movements are intact and she does not experience any diplopia when making those maneuvers.    Nose: Nose normal.     Mouth/Throat:     Mouth: Mucous membranes are moist.  Eyes:     Extraocular Movements: Extraocular movements intact.     Pupils: Pupils are equal, round, and reactive to light.  Musculoskeletal:     Comments: She has some swelling along her distal right hand on the dorsum and at this third and fourth MCP joints.  She can just slightly extend her fingers.  She also has an abrasion and a little bit of swelling and tenderness of the anterior left distal knee.  Neurological:     Mental Status: She is alert.  UC Treatments / Results  Labs (all labs ordered are listed, but only abnormal results are  displayed) Labs Reviewed - No data to display  EKG   Radiology DG Hand Complete Right  Result Date: 03/19/2022 CLINICAL DATA:  Right third and fourth metacarpophalangeal joint pain and swelling. Fell last night. EXAM: RIGHT HAND - COMPLETE 3+ VIEW COMPARISON:  None Available. FINDINGS: Normal bone mineralization. Frontal view there is a small 3 x 1 mm ossific density just lateral to the 4th metacarpal head-neck junction. Possible tiny superficial cortical donor site at the lateral aspect of the metacarpal in this region. Joint space narrowing and peripheral osteophytes are moderate to severe at the fifth PIP joint and thumb interphalangeal joint, and moderate at the second through fifth DIP joints. Severe triscaphe and moderate thumb carpometacarpal joint space narrowing, subchondral sclerosis, and peripheral osteophytosis. No dislocation. IMPRESSION: 1. Small ossific density just lateral to the 4th metacarpal head-neck junction that may represent a small minimally displaced fracture fragment. 2. 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 Electronically Signed   By: Neita Garnet M.D.   On: 03/19/2022 10:17   DG Knee AP/LAT W/Sunrise Left  Result Date: 03/19/2022 CLINICAL DATA:  Left knee pain and swelling.  Fell last night. EXAM: LEFT KNEE 3 VIEWS COMPARISON:  None Available. FINDINGS: Moderate medial compartment joint space narrowing and peripheral osteophytosis. Mild patellofemoral joint space narrowing and moderate superior and mild lateral and inferior patellar degenerative osteophytes. Mild peripheral lateral compartment degenerative osteophytosis without joint space narrowing. No joint effusion. Mild chronic enthesopathic change at the quadriceps and patellar insertions on the patella. No acute fracture or dislocation. 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 Electronically Signed   By: Neita Garnet M.D.   On: 03/19/2022 10:11    Procedures Procedures (including critical care time)  Medications Ordered in UC Medications - No data to display  Initial Impression / Assessment and Plan / UC Course  I have reviewed the triage vital signs and the nursing notes.  Pertinent labs & imaging results that were available during my care of the patient were reviewed by me and considered in my medical decision making (see chart for details).        X-ray of the knee is benign.  X-ray of the hand shows a possible avulsion fracture near the head of the fourth metacarpal.  Volar splint will be applied.  Tramadol is sent for pain relief.  She is given contact information for hand specialist Final Clinical Impressions(s) / UC Diagnoses   Final diagnoses:  Closed displaced fracture of fourth metacarpal bone of right hand, unspecified portion of metacarpal, initial encounter  Acute pain of left knee     Discharge Instructions      There is no broken bone of your knee.  There is a tiny piece of bone that could be a chip fracture off the end of the fourth hand bone near your knuckle.  Take tramadol 50 mg-- 1 tablet every 6 hours as needed for pain.  This medication can make you sleepy or dizzy  You may find Tylenol is sufficient.  Ice and elevate your bruised knee and your hand.  You can put triple antibiotic ointment on the scrapes on your knee and on your face     ED Prescriptions     Medication Sig Dispense Auth. Provider   traMADol (ULTRAM) 50 MG tablet Take 1 tablet (50 mg total) by mouth every  6 (six) hours as needed (pain). 12 tablet Priscella Donna, Janace Aris, MD      I have reviewed the PDMP during this encounter.   Zenia Resides, MD 03/19/22 1028

## 2022-03-19 NOTE — ED Triage Notes (Signed)
Pt reports went out to car last night rushing due to coldness and fell getting wrapping paper out of car. Pt has swelling to right hand and pain. Bruising to left eye and left knee. Takes Aspirin daily. Denies LOC.

## 2022-03-19 NOTE — Discharge Instructions (Signed)
There is no broken bone of your knee.  There is a tiny piece of bone that could be a chip fracture off the end of the fourth hand bone near your knuckle.  Take tramadol 50 mg-- 1 tablet every 6 hours as needed for pain.  This medication can make you sleepy or dizzy  You may find Tylenol is sufficient.  Ice and elevate your bruised knee and your hand.  You can put triple antibiotic ointment on the scrapes on your knee and on your face

## 2022-03-23 DIAGNOSIS — M25641 Stiffness of right hand, not elsewhere classified: Secondary | ICD-10-CM | POA: Diagnosis not present

## 2022-04-01 DIAGNOSIS — M25641 Stiffness of right hand, not elsewhere classified: Secondary | ICD-10-CM | POA: Diagnosis not present

## 2022-04-02 DIAGNOSIS — S62304A Unspecified fracture of fourth metacarpal bone, right hand, initial encounter for closed fracture: Secondary | ICD-10-CM | POA: Diagnosis not present

## 2022-04-13 DIAGNOSIS — M25641 Stiffness of right hand, not elsewhere classified: Secondary | ICD-10-CM | POA: Diagnosis not present

## 2022-04-20 DIAGNOSIS — M25641 Stiffness of right hand, not elsewhere classified: Secondary | ICD-10-CM | POA: Diagnosis not present

## 2022-04-23 DIAGNOSIS — S62304A Unspecified fracture of fourth metacarpal bone, right hand, initial encounter for closed fracture: Secondary | ICD-10-CM | POA: Diagnosis not present

## 2022-05-11 DIAGNOSIS — M25641 Stiffness of right hand, not elsewhere classified: Secondary | ICD-10-CM | POA: Diagnosis not present

## 2022-05-18 DIAGNOSIS — M25641 Stiffness of right hand, not elsewhere classified: Secondary | ICD-10-CM | POA: Diagnosis not present

## 2022-05-18 DIAGNOSIS — M79641 Pain in right hand: Secondary | ICD-10-CM | POA: Diagnosis not present

## 2022-05-18 DIAGNOSIS — M79644 Pain in right finger(s): Secondary | ICD-10-CM | POA: Diagnosis not present

## 2022-05-25 DIAGNOSIS — M25641 Stiffness of right hand, not elsewhere classified: Secondary | ICD-10-CM | POA: Diagnosis not present

## 2022-05-25 DIAGNOSIS — M79641 Pain in right hand: Secondary | ICD-10-CM | POA: Diagnosis not present

## 2022-05-25 DIAGNOSIS — M79644 Pain in right finger(s): Secondary | ICD-10-CM | POA: Diagnosis not present

## 2022-05-26 ENCOUNTER — Other Ambulatory Visit (INDEPENDENT_AMBULATORY_CARE_PROVIDER_SITE_OTHER): Payer: Medicare PPO

## 2022-05-26 DIAGNOSIS — E1165 Type 2 diabetes mellitus with hyperglycemia: Secondary | ICD-10-CM | POA: Diagnosis not present

## 2022-05-26 DIAGNOSIS — E538 Deficiency of other specified B group vitamins: Secondary | ICD-10-CM

## 2022-05-26 DIAGNOSIS — E559 Vitamin D deficiency, unspecified: Secondary | ICD-10-CM

## 2022-05-26 LAB — MICROALBUMIN / CREATININE URINE RATIO
Creatinine,U: 80.1 mg/dL
Microalb Creat Ratio: 24.1 mg/g (ref 0.0–30.0)
Microalb, Ur: 19.3 mg/dL — ABNORMAL HIGH (ref 0.0–1.9)

## 2022-05-26 LAB — CBC WITH DIFFERENTIAL/PLATELET
Basophils Absolute: 0 10*3/uL (ref 0.0–0.1)
Basophils Relative: 0.7 % (ref 0.0–3.0)
Eosinophils Absolute: 0.1 10*3/uL (ref 0.0–0.7)
Eosinophils Relative: 1 % (ref 0.0–5.0)
HCT: 41.9 % (ref 36.0–46.0)
Hemoglobin: 13.9 g/dL (ref 12.0–15.0)
Lymphocytes Relative: 38.2 % (ref 12.0–46.0)
Lymphs Abs: 2.5 10*3/uL (ref 0.7–4.0)
MCHC: 33.2 g/dL (ref 30.0–36.0)
MCV: 83.7 fl (ref 78.0–100.0)
Monocytes Absolute: 0.4 10*3/uL (ref 0.1–1.0)
Monocytes Relative: 6.5 % (ref 3.0–12.0)
Neutro Abs: 3.6 10*3/uL (ref 1.4–7.7)
Neutrophils Relative %: 53.6 % (ref 43.0–77.0)
Platelets: 237 10*3/uL (ref 150.0–400.0)
RBC: 5.01 Mil/uL (ref 3.87–5.11)
RDW: 14.3 % (ref 11.5–15.5)
WBC: 6.6 10*3/uL (ref 4.0–10.5)

## 2022-05-26 LAB — BASIC METABOLIC PANEL
BUN: 10 mg/dL (ref 6–23)
CO2: 30 mEq/L (ref 19–32)
Calcium: 10 mg/dL (ref 8.4–10.5)
Chloride: 102 mEq/L (ref 96–112)
Creatinine, Ser: 0.94 mg/dL (ref 0.40–1.20)
GFR: 58.31 mL/min — ABNORMAL LOW (ref >60.00)
Glucose, Bld: 102 mg/dL — ABNORMAL HIGH (ref 70–99)
Potassium: 3.9 mEq/L (ref 3.5–5.1)
Sodium: 140 mEq/L (ref 135–145)

## 2022-05-26 LAB — HEPATIC FUNCTION PANEL
ALT: 9 U/L (ref 0–35)
AST: 15 U/L (ref 0–37)
Albumin: 3.9 g/dL (ref 3.5–5.2)
Alkaline Phosphatase: 55 U/L (ref 39–117)
Bilirubin, Direct: 0.1 mg/dL (ref 0.0–0.3)
Total Bilirubin: 0.5 mg/dL (ref 0.2–1.2)
Total Protein: 6.9 g/dL (ref 6.0–8.3)

## 2022-05-26 LAB — URINALYSIS, ROUTINE W REFLEX MICROSCOPIC
Bilirubin Urine: NEGATIVE
Hgb urine dipstick: NEGATIVE
Ketones, ur: NEGATIVE
Nitrite: NEGATIVE
RBC / HPF: NONE SEEN (ref 0–?)
Specific Gravity, Urine: 1.02 (ref 1.000–1.030)
Urine Glucose: NEGATIVE
Urobilinogen, UA: 0.2 (ref 0.0–1.0)
pH: 6 (ref 5.0–8.0)

## 2022-05-26 LAB — VITAMIN B12: Vitamin B-12: 1344 pg/mL — ABNORMAL HIGH (ref 211–911)

## 2022-05-26 LAB — LIPID PANEL
Cholesterol: 134 mg/dL (ref 0–200)
HDL: 66.6 mg/dL (ref >39.00)
LDL Cholesterol: 51 mg/dL (ref 0–99)
NonHDL: 67.51
Total CHOL/HDL Ratio: 2
Triglycerides: 82 mg/dL (ref 0.0–149.0)
VLDL: 16.4 mg/dL (ref 0.0–40.0)

## 2022-05-26 LAB — HEMOGLOBIN A1C: Hgb A1c MFr Bld: 6.6 % — ABNORMAL HIGH (ref 4.6–6.5)

## 2022-05-26 LAB — TSH: TSH: 1.51 u[IU]/mL (ref 0.35–5.50)

## 2022-05-26 LAB — VITAMIN D 25 HYDROXY (VIT D DEFICIENCY, FRACTURES): VITD: 50.05 ng/mL (ref 30.00–100.00)

## 2022-05-27 ENCOUNTER — Ambulatory Visit (INDEPENDENT_AMBULATORY_CARE_PROVIDER_SITE_OTHER): Payer: Medicare PPO | Admitting: Internal Medicine

## 2022-05-27 VITALS — BP 122/68 | HR 109 | Temp 98.0°F | Ht 64.0 in | Wt 195.0 lb

## 2022-05-27 DIAGNOSIS — E1165 Type 2 diabetes mellitus with hyperglycemia: Secondary | ICD-10-CM

## 2022-05-27 DIAGNOSIS — Z0001 Encounter for general adult medical examination with abnormal findings: Secondary | ICD-10-CM | POA: Diagnosis not present

## 2022-05-27 DIAGNOSIS — G629 Polyneuropathy, unspecified: Secondary | ICD-10-CM

## 2022-05-27 DIAGNOSIS — S62394S Other fracture of fourth metacarpal bone, right hand, sequela: Secondary | ICD-10-CM | POA: Diagnosis not present

## 2022-05-27 DIAGNOSIS — E538 Deficiency of other specified B group vitamins: Secondary | ICD-10-CM

## 2022-05-27 DIAGNOSIS — F17200 Nicotine dependence, unspecified, uncomplicated: Secondary | ICD-10-CM | POA: Diagnosis not present

## 2022-05-27 DIAGNOSIS — E7800 Pure hypercholesterolemia, unspecified: Secondary | ICD-10-CM

## 2022-05-27 DIAGNOSIS — E559 Vitamin D deficiency, unspecified: Secondary | ICD-10-CM

## 2022-05-27 NOTE — Progress Notes (Signed)
Patient ID: Brittany Oconnor, female   DOB: [birth year], 77 y.o.   MRN: [id number]         Chief Complaint:: wellness exam and toe numbness, recent right 4th finger fx after fall, low b12, low Vit d, hld, dm       HPI:  Brittany Oconnor is a 77 y.o. female here for wellness exam; declines covid booster, flu shot, but for shingrix at the pharmacy o/w up to date                        Also Pt denies chest pain, increased sob or doe, wheezing, orthopnea, PND, increased LE swelling, palpitations, dizziness or syncope.   Pt denies polydipsia, polyuria, or new focal neuro s/s except for mild worsening bilateral toes numbness she noticies more at bedtime but still able to sleep    Pt denies fever, wt loss, night sweats, loss of appetite, or other constitutional symptoms  Did have fall with recent 4th finger splinter fx per pt, declines DXA for now.  Still smoking, pt not ready to quit.     Wt Readings from Last 3 Encounters:  05/27/22 195 lb (88.5 kg)  12/04/21 175 lb (79.4 kg)  05/26/21 175 lb 12.8 oz (79.7 kg)   BP Readings from Last 3 Encounters:  05/27/22 122/68  03/19/22 (!) 174/92  12/04/21 134/76   Immunization History  Administered Date(s) Administered   Fluad Quad(high Dose 65+) 11/16/2018   Influenza Whole 01/27/2010   PFIZER(Purple Top)SARS-COV-2 Vaccination 05/05/2019, 05/26/2019, 12/24/2019, 06/27/2020   Pneumococcal Conjugate-13 05/17/2014   Pneumococcal Polysaccharide-23 07/10/2008, 05/10/2017   Td 07/10/2008   Tdap 11/16/2018   Zoster, Live 07/10/2008   There are no preventive care reminders to display for this patient.     Past Medical History:  Diagnosis Date   ALLERGIC RHINITIS 11/24/2006   Allergy    ANXIETY 07/10/2008   DIABETES MELLITUS, TYPE II 11/24/2006   GERD (gastroesophageal reflux disease)    History of shingles    HYPERLIPIDEMIA 11/24/2006   Past Surgical History:  Procedure Laterality Date   BREAST BIOPSY Right 03/11/2015   benign   MOUTH SURGERY       reports that she has been smoking cigarettes. She has a 23.50 pack-year smoking history. She has never used smokeless tobacco. She reports that she does not drink alcohol and does not use drugs. family history includes Appendicitis in her father; Colon cancer (age of onset: 34) in her sister; Colon cancer (age of onset: 49) in her sister; Heart disease in her sister; Hypertension in her father; Stroke in her mother. Allergies  Allergen Reactions   Shellfish Allergy Hives   Doxycycline    Current Outpatient Medications on File Prior to Visit  Medication Sig Dispense Refill   aspirin 81 MG tablet Take 81 mg by mouth daily.     cholecalciferol (VITAMIN D3) 25 MCG (1000 UNIT) tablet Take 50 mcg by mouth daily.     cyanocobalamin (VITAMIN B12) 1000 MCG tablet Take 1,000 mcg by mouth daily.     Lancets MISC Apply 1 Device topically daily. E11.9 (Patient not taking: Reported on 05/26/2021) 100 each 3   metFORMIN (GLUCOPHAGE-XR) 500 MG 24 hr tablet TAKE 3 TABLETS BY MOUTH EVERY MORNING 270 tablet 3   simvastatin (ZOCOR) 20 MG tablet TAKE 1 TABLET BY MOUTH EVERY DAY IN THE EVENING 90 tablet 3   traMADol (ULTRAM) 50 MG tablet Take 1 tablet (50 mg total) by mouth  every 6 (six) hours as needed (pain). 12 tablet 0   No current facility-administered medications on file prior to visit.        ROS:  All others reviewed and negative.  Objective        PE:  BP 122/68 (BP Location: Left Arm, Patient Position: Sitting, Cuff Size: Large)   Pulse (!) 109   Temp 98 F (36.7 C) (Oral)   Ht '5\' 4"'$  (1.626 m)   Wt 195 lb (88.5 kg)   SpO2 97%   BMI 33.47 kg/m                 Constitutional: Pt appears in NAD               HENT: Head: NCAT.                Right Ear: External ear normal.                 Left Ear: External ear normal.                Eyes: . Pupils are equal, round, and reactive to light. Conjunctivae and EOM are normal               Nose: without d/c or deformity               Neck: Neck  supple. Gross normal ROM               Cardiovascular: Normal rate and regular rhythm.                 Pulmonary/Chest: Effort normal and breath sounds without rales or wheezing.                Abd:  Soft, NT, ND, + BS, no organomegaly               Neurological: Pt is alert. At baseline orientation, motor grossly intact               Skin: Skin is warm. No rashes, no other new lesions, LE edema - none               Psychiatric: Pt behavior is normal without agitation   Micro: none  Cardiac tracings I have personally interpreted today:  none  Pertinent Radiological findings (summarize): none   Lab Results  Component Value Date   WBC 6.6 05/26/2022   HGB 13.9 05/26/2022   HCT 41.9 05/26/2022   PLT 237.0 05/26/2022   GLUCOSE 102 (H) 05/26/2022   CHOL 134 05/26/2022   TRIG 82.0 05/26/2022   HDL 66.60 05/26/2022   LDLDIRECT 99.9 03/07/2007   LDLCALC 51 05/26/2022   ALT 9 05/26/2022   AST 15 05/26/2022   NA 140 05/26/2022   K 3.9 05/26/2022   CL 102 05/26/2022   CREATININE 0.94 05/26/2022   BUN 10 05/26/2022   CO2 30 05/26/2022   TSH 1.51 05/26/2022   HGBA1C 6.6 (H) 05/26/2022   MICROALBUR 19.3 (H) 05/26/2022   Assessment/Plan:  Brittany Oconnor is a 77 y.o. Black or African American [2] female with  has a past medical history of ALLERGIC RHINITIS (11/24/2006), Allergy, ANXIETY (07/10/2008), DIABETES MELLITUS, TYPE II (11/24/2006), GERD (gastroesophageal reflux disease), History of shingles, and HYPERLIPIDEMIA (11/24/2006).  Encounter for well adult exam with abnormal findings Age and sex appropriate education and counseling updated with regular exercise and diet Referrals for preventative services - declines dxa despite recent finger  fx Immunizations addressed - declines covid booster, flu shot, but for shingrix at the pharmacy Smoking counseling  - pt counsled to quit, pt not ready Evidence for depression or other mood disorder - none significant Most recent labs reviewed. I  have personally reviewed and have noted: 1) the patient's medical and social history 2) The patient's current medications and supplements 3) The patient's height, weight, and BMI have been recorded in the chart   B12 deficiency Lab Results  Component Value Date   VITAMINB12 1,344 (H) 05/26/2022   Stable, cont oral replacement - b12 1000 mcg qd   Diabetes Lab Results  Component Value Date   HGBA1C 6.6 (H) 05/26/2022   Stable, pt to continue current medical treatment metomrin ER 500 mg - 3 qd   Fracture of metacarpal bone Pt declines DXA despite recent fx  Hyperlipidemia Lab Results  Component Value Date   LDLCALC 51 05/26/2022   Stable, pt to continue current statin zocor 20 mg qd   Neuropathy With mild worsening recently to toes, but not painful and does not keep her up at night,  to f/u any worsening symptoms or concerns  Smoker Pt counsled to quit, pt not ready  Vitamin D deficiency Last vitamin D Lab Results  Component Value Date   VD25OH 50.05 05/26/2022   Stable, cont oral replacement  Followup: Return in about 6 months (around 11/25/2022).  Cathlean Cower, MD 05/30/2022 12:40 PM [facility]

## 2022-05-27 NOTE — Patient Instructions (Addendum)
Please have your Shingrix (shingles) shots done at your local pharmacy  Please continue all other medications as before, and refills have been done if requested.  Please have the pharmacy call with any other refills you may need.  Please continue your efforts at being more active, low cholesterol diet, and weight control.  You are otherwise up to date with prevention measures today.  Please keep your appointments with your specialists as you may have planned  Please make an Appointment to return in 6 months, or sooner if needed, also with Lab Appointment for testing done 3-5 days before at the [facility] (so this is for TWO appointments - please see the scheduling desk as you leave)

## 2022-05-30 ENCOUNTER — Encounter: Payer: Self-pay | Admitting: Internal Medicine

## 2022-05-30 NOTE — Assessment & Plan Note (Signed)
Age and sex appropriate education and counseling updated with regular exercise and diet Referrals for preventative services - declines dxa despite recent finger fx Immunizations addressed - declines covid booster, flu shot, but for shingrix at the pharmacy Smoking counseling  - pt counsled to quit, pt not ready Evidence for depression or other mood disorder - none significant Most recent labs reviewed. I have personally reviewed and have noted: 1) the patient's medical and social history 2) The patient's current medications and supplements 3) The patient's height, weight, and BMI have been recorded in the chart

## 2022-05-30 NOTE — Assessment & Plan Note (Signed)
Lab Results  Component Value Date   HGBA1C 6.6 (H) 05/26/2022   Stable, pt to continue current medical treatment metomrin ER 500 mg - 3 qd

## 2022-05-30 NOTE — Assessment & Plan Note (Signed)
With mild worsening recently to toes, but not painful and does not keep her up at night,  to f/u any worsening symptoms or concerns

## 2022-05-30 NOTE — Assessment & Plan Note (Signed)
Lab Results  Component Value Date   VITAMINB12 1,344 (H) 05/26/2022   Stable, cont oral replacement - b12 1000 mcg qd

## 2022-05-30 NOTE — Assessment & Plan Note (Signed)
Pt counsled to quit, pt not ready °

## 2022-05-30 NOTE — Assessment & Plan Note (Signed)
Last vitamin D Lab Results  Component Value Date   VD25OH 50.05 05/26/2022   Stable, cont oral replacement

## 2022-05-30 NOTE — Assessment & Plan Note (Signed)
Lab Results  Component Value Date   LDLCALC 51 05/26/2022   Stable, pt to continue current statin zocor 20 mg qd

## 2022-05-30 NOTE — Assessment & Plan Note (Signed)
Pt declines DXA despite recent fx

## 2022-06-01 DIAGNOSIS — M25641 Stiffness of right hand, not elsewhere classified: Secondary | ICD-10-CM | POA: Diagnosis not present

## 2022-09-30 ENCOUNTER — Other Ambulatory Visit: Payer: Self-pay | Admitting: Internal Medicine

## 2022-10-01 ENCOUNTER — Other Ambulatory Visit: Payer: Self-pay

## 2022-10-06 ENCOUNTER — Telehealth: Payer: Self-pay | Admitting: *Deleted

## 2022-10-06 NOTE — Telephone Encounter (Signed)
FYI: Per CMS Age guidelines for lung cancer screening, patient will not qualify for lung cancer screening. Patient must be between age 78-77 to qualify. Further follow up can be done through PCP.   

## 2022-10-27 ENCOUNTER — Encounter (INDEPENDENT_AMBULATORY_CARE_PROVIDER_SITE_OTHER): Payer: Self-pay

## 2022-11-01 ENCOUNTER — Ambulatory Visit (INDEPENDENT_AMBULATORY_CARE_PROVIDER_SITE_OTHER): Payer: Medicare PPO

## 2022-11-01 VITALS — Ht 62.5 in | Wt 180.4 lb

## 2022-11-01 DIAGNOSIS — Z0000 Encounter for general adult medical examination without abnormal findings: Secondary | ICD-10-CM

## 2022-11-01 DIAGNOSIS — Z780 Asymptomatic menopausal state: Secondary | ICD-10-CM

## 2022-11-01 NOTE — Patient Instructions (Signed)
Brittany Oconnor , Thank you for taking time to come for your Medicare Wellness Visit. I appreciate your ongoing commitment to your health goals. Please review the following plan we discussed and let me know if I can assist you in the future.   Referrals/Orders/Follow-Ups/Clinician Recommendations: Remember to have your 2 dose Shingrix vaccine done at [facility].  Also remember to call and schedule for you lung screening due in September 2024.  Call to schedule for your bone density screening ([facility]: [address], [place], [place] [address], [phone]).    This is a list of the screening recommended for you and due dates:  Health Maintenance  Topic Date Due   Zoster (Shingles) Vaccine (1 of 2) 05[birth year]14/1964   COVID-19 Vaccine (5 - 2023-24 season) 11/27/2021   Flu Shot  10/28/2022   Hemoglobin A1C  11/24/2022   Screening for Lung Cancer  12/23/2022   Eye exam for diabetics  01/12/2023   Yearly kidney function blood test for diabetes  05/27/2023   Yearly kidney health urinalysis for diabetes  05/27/2023   Complete foot exam   05/27/2023   Medicare Annual Wellness Visit  11/01/2023   DTaP/Tdap/Td vaccine (3 - Td or Tdap) 11/15/2028   Pneumonia Vaccine  Completed   DEXA scan (bone density measurement)  Completed   Hepatitis C Screening  Completed   HPV Vaccine  Aged Out   Colon Cancer Screening  Discontinued    Advanced directives: (Copy Requested) Please bring a copy of your health care power of attorney and living will to the office to be added to your chart at your convenience.  Next Medicare Annual Wellness Visit scheduled for next year: Yes  Preventive Care 40 Years and Older, Female Preventive care refers to lifestyle choices and visits with your health care provider that can promote health and wellness. What does preventive care include? A yearly physical exam. This is also called an annual well check. Dental exams once or twice a year. Routine eye  exams. Ask your health care provider how often you should have your eyes checked. Personal lifestyle choices, including: Daily care of your teeth and gums. Regular physical activity. Eating a healthy diet. Avoiding tobacco and drug use. Limiting alcohol use. Practicing safe sex. Taking low-dose aspirin every day. Taking vitamin and mineral supplements as recommended by your health care provider. What happens during an annual well check? The services and screenings done by your health care provider during your annual well check will depend on your age, overall health, lifestyle risk factors, and family history of disease. Counseling  Your health care provider may ask you questions about your: Alcohol use. Tobacco use. Drug use. Emotional well-being. Home and relationship well-being. Sexual activity. Eating habits. History of falls. Memory and ability to understand (cognition). Work and work Astronomer. Reproductive health. Screening  You may have the following tests or measurements: Height, weight, and BMI. Blood pressure. Lipid and cholesterol levels. These may be checked every 5 years, or more frequently if you are over 6 years old. Skin check. Lung cancer screening. You may have this screening every year starting at age 31 if you have a 30-pack-year history of smoking and currently smoke or have quit within the past 15 years. Fecal occult blood test (FOBT) of the stool. You may have this test every year starting at age 69. Flexible sigmoidoscopy or colonoscopy. You may have a sigmoidoscopy every 5 years or a colonoscopy every 10 years starting at  age 61. Hepatitis C blood test. Hepatitis B blood test. Sexually transmitted disease (STD) testing. Diabetes screening. This is done by checking your blood sugar (glucose) after you have not eaten for a while (fasting). You may have this done every 1-3 years. Bone density scan. This is done to screen for osteoporosis. You may have  this done starting at age 9. Mammogram. This may be done every 1-2 years. Talk to your health care provider about how often you should have regular mammograms. Talk with your health care provider about your test results, treatment options, and if necessary, the need for more tests. Vaccines  Your health care provider may recommend certain vaccines, such as: Influenza vaccine. This is recommended every year. Tetanus, diphtheria, and acellular pertussis (Tdap, Td) vaccine. You may need a Td booster every 10 years. Zoster vaccine. You may need this after age 50. Pneumococcal 13-valent conjugate (PCV13) vaccine. One dose is recommended after age 66. Pneumococcal polysaccharide (PPSV23) vaccine. One dose is recommended after age 54. Talk to your health care provider about which screenings and vaccines you need and how often you need them. This information is not intended to replace advice given to you by your health care provider. Make sure you discuss any questions you have with your health care provider. Document Released: 04/11/2015 Document Revised: 12/03/2015 Document Reviewed: 01/14/2015 Elsevier Interactive Patient Education  2017 [employer].  Fall Prevention in the Home Falls can cause injuries. They can happen to people of all ages. There are many things you can do to make your home safe and to help prevent falls. What can I do on the outside of my home? Regularly fix the edges of walkways and driveways and fix any cracks. Remove anything that might make you trip as you walk through a door, such as a raised step or threshold. Trim any bushes or trees on the path to your home. Use bright outdoor lighting. Clear any walking paths of anything that might make someone trip, such as rocks or tools. Regularly check to see if handrails are loose or broken. Make sure that both sides of any steps have handrails. Any raised decks and porches should have guardrails on the edges. Have any leaves,  snow, or ice cleared regularly. Use sand or salt on walking paths during winter. Clean up any spills in your garage right away. This includes oil or grease spills. What can I do in the bathroom? Use night lights. Install grab bars by the toilet and in the tub and shower. Do not use towel bars as grab bars. Use non-skid mats or decals in the tub or shower. If you need to sit down in the shower, use a plastic, non-slip stool. Keep the floor dry. Clean up any water that spills on the floor as soon as it happens. Remove soap buildup in the tub or shower regularly. Attach bath mats securely with double-sided non-slip rug tape. Do not have throw rugs and other things on the floor that can make you trip. What can I do in the bedroom? Use night lights. Make sure that you have a light by your bed that is easy to reach. Do not use any sheets or blankets that are too big for your bed. They should not hang down onto the floor. Have a firm chair that has side arms. You can use this for support while you get dressed. Do not have throw rugs and other things on the floor that can make you trip. What can I  do in the kitchen? Clean up any spills right away. Avoid walking on wet floors. Keep items that you use a lot in easy-to-reach places. If you need to reach something above you, use a strong step stool that has a grab bar. Keep electrical cords out of the way. Do not use floor polish or wax that makes floors slippery. If you must use wax, use non-skid floor wax. Do not have throw rugs and other things on the floor that can make you trip. What can I do with my stairs? Do not leave any items on the stairs. Make sure that there are handrails on both sides of the stairs and use them. Fix handrails that are broken or loose. Make sure that handrails are as long as the stairways. Check any carpeting to make sure that it is firmly attached to the stairs. Fix any carpet that is loose or worn. Avoid having throw  rugs at the top or bottom of the stairs. If you do have throw rugs, attach them to the floor with carpet tape. Make sure that you have a light switch at the top of the stairs and the bottom of the stairs. If you do not have them, ask someone to add them for you. What else can I do to help prevent falls? Wear shoes that: Do not have high heels. Have rubber bottoms. Are comfortable and fit you well. Are closed at the toe. Do not wear sandals. If you use a stepladder: Make sure that it is fully opened. Do not climb a closed stepladder. Make sure that both sides of the stepladder are locked into place. Ask someone to hold it for you, if possible. Clearly mark and make sure that you can see: Any grab bars or handrails. First and last steps. Where the edge of each step is. Use tools that help you move around (mobility aids) if they are needed. These include: Canes. Walkers. Scooters. Crutches. Turn on the lights when you go into a dark area. Replace any light bulbs as soon as they burn out. Set up your furniture so you have a clear path. Avoid moving your furniture around. If any of your floors are uneven, fix them. If there are any pets around you, be aware of where they are. Review your medicines with your doctor. Some medicines can make you feel dizzy. This can increase your chance of falling. Ask your doctor what other things that you can do to help prevent falls. This information is not intended to replace advice given to you by your health care provider. Make sure you discuss any questions you have with your health care provider. Document Released: 01/09/2009 Document Revised: 08/21/2015 Document Reviewed: 04/19/2014 Elsevier Interactive Patient Education  2017 [employer].

## 2022-11-01 NOTE — Progress Notes (Deleted)
Subjective:   Brittany Oconnor is a 77 y.o. female who presents for Medicare Annual (Subsequent) preventive examination.  Visit Complete: In person   Review of Systems    Cardiac Risk Factors include: advanced age (>81men, >20 women);diabetes mellitus;dyslipidemia;Other (see comment), Risk factor comments: COPD     Objective:    Today's Vitals   11/01/22 1434  Weight: 180 lb 6.4 oz (81.8 kg)  Height: 5' 2.5" (1.588 m)   Body mass index is 32.47 kg/m.     11/01/2022    2:48 PM 11/16/2021    1:37 PM 11/13/2020   12:38 PM 05/28/2015    9:57 AM  Advanced Directives  Does Patient Have a Medical Advance Directive? Yes No No No  Type of Estate agent of [place];Living will     Copy of Healthcare Power of Attorney in Chart? No - copy requested     Would patient like information on creating a medical advance directive?  No - Patient declined No - Patient declined     Current Medications (verified) Outpatient Encounter Medications as of 11/01/2022  Medication Sig   aspirin 81 MG tablet Take 81 mg by mouth daily.   cholecalciferol (VITAMIN D3) 25 MCG (1000 UNIT) tablet Take 50 mcg by mouth daily.   cyanocobalamin (VITAMIN B12) 1000 MCG tablet Take 1,000 mcg by mouth daily.   metFORMIN (GLUCOPHAGE-XR) 500 MG 24 hr tablet TAKE 3 TABLETS BY MOUTH EVERY MORNING   simvastatin (ZOCOR) 20 MG tablet TAKE 1 TABLET BY MOUTH EVERY DAY IN THE EVENING   Lancets MISC Apply 1 Device topically daily. E11.9 (Patient not taking: Reported on 05/26/2021)   traMADol (ULTRAM) 50 MG tablet Take 1 tablet (50 mg total) by mouth every 6 (six) hours as needed (pain). (Patient not taking: Reported on 11/01/2022)   No facility-administered encounter medications on file as of 11/01/2022.    Allergies (verified) Shellfish allergy and Doxycycline   History: Past Medical History:  Diagnosis Date   ALLERGIC RHINITIS 11/24/2006   Allergy    ANXIETY 07/10/2008   DIABETES MELLITUS, TYPE II 11/24/2006    GERD (gastroesophageal reflux disease)    History of shingles    HYPERLIPIDEMIA 11/24/2006   Past Surgical History:  Procedure Laterality Date   BREAST BIOPSY Right 03/11/2015   benign   EYE SURGERY Bilateral 2022   Cataract surgery   MOUTH SURGERY     Family History  Problem Relation Age of Onset   Colon cancer Sister 61       deceased    Heart disease Sister        died of heart attack   Colon cancer Sister 98       alive   Hypertension Father    Appendicitis Father        rupture   Stroke Mother        stroke 59;died age 56   Social History   Socioeconomic History   Marital status: Married    Spouse name: Bruce   Number of children: 1   Years of education: Not on file   Highest education level: Not on file  Occupational History   Not on file  Tobacco Use   Smoking status: Every Day    Current packs/day: 0.50    Average packs/day: 0.5 packs/day for 47.0 years (23.5 ttl pk-yrs)    Types: Cigarettes   Smokeless tobacco: Never   Tobacco comments:    0.5 ppd  Vaping Use   Vaping status:  Never Used  Substance and Sexual Activity   Alcohol use: No    Alcohol/week: 0.0 standard drinks of alcohol   Drug use: No   Sexual activity: Not on file  Other Topics Concern   Not on file  Social History Narrative   Not on file   Social Determinants of Health   Financial Resource Strain: Low Risk  (11/01/2022)   Overall Financial Resource Strain (CARDIA)    Difficulty of Paying Living Expenses: Not hard at all  Food Insecurity: No Food Insecurity (11/01/2022)   Hunger Vital Sign    Worried About Running Out of Food in the Last Year: Never true    Ran Out of Food in the Last Year: Never true  Transportation Needs: No Transportation Needs (11/01/2022)   PRAPARE - Administrator, Civil Service (Medical): No    Lack of Transportation (Non-Medical): No  Physical Activity: Inactive (11/01/2022)   Exercise Vital Sign    Days of Exercise per Week: 0 days    Minutes  of Exercise per Session: 0 min  Stress: No Stress Concern Present (11/16/2021)   [employer] of Occupational Health - Occupational Stress Questionnaire    Feeling of Stress : Not at all  Social Connections: Moderately Isolated (11/01/2022)   Social Connection and Isolation Panel [NHANES]    Frequency of Communication with Friends and Family: More than three times a week    Frequency of Social Gatherings with Friends and Family: Once a week    Attends Religious Services: Never    Database administrator or Organizations: No    Attends Engineer, structural: Never    Marital Status: Married    Tobacco Counseling Ready to quit: Not Answered Counseling given: Not Answered Tobacco comments: 0.5 ppd   Clinical Intake:  Pre-visit preparation completed: Yes  Pain : No/denies pain (has worsening numbness in left leg)     BMI - recorded: 32.47 Nutritional Status: BMI > 30  Obese Nutritional Risks: None Diabetes: Yes CBG done?: No Did pt. bring in CBG monitor from home?: No  How often do you need to have someone help you when you read instructions, pamphlets, or other written materials from your doctor or pharmacy?: 1 - Never  Interpreter Needed?: No  Information entered by ::  , RMA   Activities of Daily Living    11/01/2022    2:37 PM 11/16/2021    1:49 PM  In your present state of health, do you have any difficulty performing the following activities:  Hearing? 0 0  Vision? 1 0  Difficulty concentrating or making decisions? 0 0  Walking or climbing stairs? 0 0  Dressing or bathing? 0 0  Doing errands, shopping? 0 0  Preparing Food and eating ? N N  Using the Toilet? N N  In the past six months, have you accidently leaked urine? N N  Do you have problems with loss of bowel control? N N  Managing your Medications? N N  Managing your Finances? N N  Housekeeping or managing your Housekeeping? N N    Patient Care Team: Corwin Levins, MD as PCP -  General  Indicate any recent Medical Services you may have received from other than [facility] providers in the past year (date may be approximate).     Assessment:   This is a routine wellness examination for Brittany Oconnor.  Hearing/Vision screen Hearing Screening - Comments:: Denies hearing difficulties   Vision Screening - Comments:: Cataract surgery  Dietary issues and exercise activities discussed:     Goals Addressed   None   Depression Screen    11/01/2022    2:57 PM 05/27/2022    2:16 PM 12/04/2021    2:41 PM 11/16/2021    1:49 PM 05/26/2021    1:48 PM 11/25/2020   11:40 AM 11/13/2020   12:44 PM  PHQ 2/9 Scores  PHQ - 2 Score 0 0  0 0 0 0  PHQ- 9 Score 0 0       Exception Documentation   Patient refusal        Fall Risk    11/01/2022    2:49 PM 05/27/2022    2:30 PM 05/27/2022    2:12 PM 12/04/2021    2:41 PM 11/16/2021    1:38 PM  Fall Risk   Falls in the past year? 1 1 1  0 0  Number falls in past yr: 0 0 0  0  Injury with Fall? 1 1 1  0 0  Comment Urgent care/chipped bone right hand finger splinter fracture  in dec 2023     Risk for fall due to :   Impaired balance/gait No Fall Risks No Fall Risks  Follow up   Falls evaluation completed;Education provided Falls evaluation completed Falls evaluation completed    MEDICARE RISK AT HOME:  Medicare Risk at Home - 11/01/22 1450     Any stairs in or around the home? Yes    If so, are there any without handrails? Yes    Home free of loose throw rugs in walkways, pet beds, electrical cords, etc? Yes    Adequate lighting in your home to reduce risk of falls? Yes    Life alert? No    Use of a cane, walker or w/c? No    Grab bars in the bathroom? No    Shower chair or bench in shower? Yes    Elevated toilet seat or a handicapped toilet? No             TIMED UP AND GO:  Was the test performed?  Yes  Length of time to ambulate 10 feet: 20 sec Gait steady and fast without use of assistive device    Cognitive Function:         11/01/2022    2:51 PM 11/16/2021    1:50 PM  6CIT Screen  What Year?  0 points  What month?  0 points  What time? 0 points 0 points  Count back from 20 0 points 0 points  Months in reverse 0 points 0 points  Repeat phrase 0 points 0 points  Total Score  0 points    Immunizations Immunization History  Administered Date(s) Administered   Fluad Quad(high Dose 65+) 11/16/2018   Influenza Whole 01/27/2010   PFIZER(Purple Top)SARS-COV-2 Vaccination 05/05/2019, 05/26/2019, 12/24/2019, 06/27/2020   Pneumococcal Conjugate-13 05/17/2014   Pneumococcal Polysaccharide-23 07/10/2008, 05/10/2017   Td 07/10/2008   Tdap 11/16/2018   Zoster, Live 07/10/2008    TDAP status: Up to date  Flu Vaccine status: Due, Education has been provided regarding the importance of this vaccine. Advised may receive this vaccine at local pharmacy or Health Dept. Aware to provide a copy of the vaccination record if obtained from local pharmacy or Health Dept. Verbalized acceptance and understanding.  Pneumococcal vaccine status: Up to date  Covid-19 vaccine status: Completed vaccines  Qualifies for Shingles Vaccine? Yes   Zostavax completed Yes   Shingrix Completed?: No.    Education  has been provided regarding the importance of this vaccine. Patient has been advised to call insurance company to determine out of pocket expense if they have not yet received this vaccine. Advised may also receive vaccine at local pharmacy or Health Dept. Verbalized acceptance and understanding.  Screening Tests Health Maintenance  Topic Date Due   Zoster Vaccines- Shingrix (1 of 2) 05[birth year]14/1964   COVID-19 Vaccine (5 - 2023-24 season) 11/27/2021   INFLUENZA VACCINE  10/28/2022   HEMOGLOBIN A1C  11/24/2022   Lung Cancer Screening  12/23/2022   OPHTHALMOLOGY EXAM  01/12/2023   Diabetic kidney evaluation - eGFR measurement  05/27/2023   Diabetic kidney evaluation - Urine ACR  05/27/2023   FOOT EXAM  05/27/2023   Medicare  Annual Wellness (AWV)  11/01/2023   DTaP/Tdap/Td (3 - Td or Tdap) 11/15/2028   Pneumonia Vaccine 87+ Years old  Completed   DEXA SCAN  Completed   Hepatitis C Screening  Completed   HPV VACCINES  Aged Out   Colonoscopy  Discontinued    Health Maintenance  Health Maintenance Due  Topic Date Due   Zoster Vaccines- Shingrix (1 of 2) 05[birth year]14/1964   COVID-19 Vaccine (5 - 2023-24 season) 11/27/2021   INFLUENZA VACCINE  10/28/2022    Colorectal cancer screening: No longer required.   Mammogram status: Completed 01/19/2022. Repeat every year  Bone Density status: Ordered 11/01/2022. Pt provided with contact info and advised to call to schedule appt.  Lung Cancer Screening: (Low Dose CT Chest recommended if Age 88-80 years, 20 pack-year currently smoking OR have quit w/in 15years.) does qualify.   Lung Cancer Screening Referral: Screening due 12/23/2022  Additional Screening:  Hepatitis C Screening: does qualify; Completed 05/10/2017  Vision Screening: Recommended annual ophthalmology exams for early detection of glaucoma and other disorders of the eye. Is the patient up to date with their annual eye exam?  Yes  Who is the provider or what is the name of the office in which the patient attends annual eye exams? Dr. Laruth Bouchard office If pt is not established with a provider, would they like to be referred to a provider to establish care? No .   Dental Screening: Recommended annual dental exams for proper oral hygiene  Diabetic Foot Exam: Diabetic Foot Exam: Completed 05/27/2022  Community Resource Referral / Chronic Care Management: CRR required this visit?  No   CCM required this visit?  No     Plan:     I have personally reviewed and noted the following in the patient's chart:   Medical and social history Use of alcohol, tobacco or illicit drugs  Current medications and supplements including opioid prescriptions. Patient is currently taking opioid prescriptions. Information  provided to patient regarding non-opioid alternatives. Patient advised to discuss non-opioid treatment plan with their provider. Functional ability and status Nutritional status Physical activity Advanced directives List of other physicians Hospitalizations, surgeries, and ER visits in previous 12 months Vitals Screenings to include cognitive, depression, and falls Referrals and appointments  In addition, I have reviewed and discussed with patient certain preventive protocols, quality metrics, and best practice recommendations. A written personalized care plan for preventive services as well as general preventive health recommendations were provided to patient.      L , CMA   11/01/2022   After Visit Summary: (MyChart) Due to this being a telephonic visit, the after visit summary with patients personalized plan was offered to patient via MyChart   Nurse Notes: A referral for a DEXA has been  placed today and patient is aware to call and schedule.  She also needs to call to schedule her mammogram that due in October.  Patient is due for Lung screening coming up in September 2024.

## 2022-11-25 ENCOUNTER — Ambulatory Visit: Payer: Medicare PPO | Admitting: Internal Medicine

## 2022-12-02 ENCOUNTER — Ambulatory Visit (INDEPENDENT_AMBULATORY_CARE_PROVIDER_SITE_OTHER)
Admission: RE | Admit: 2022-12-02 | Discharge: 2022-12-02 | Disposition: A | Discharge status: Home / Self Care | Payer: Medicare PPO | Source: Ambulatory Visit | Attending: Internal Medicine | Admitting: Internal Medicine

## 2022-12-02 ENCOUNTER — Other Ambulatory Visit (INDEPENDENT_AMBULATORY_CARE_PROVIDER_SITE_OTHER): Payer: Medicare PPO

## 2022-12-02 DIAGNOSIS — E538 Deficiency of other specified B group vitamins: Secondary | ICD-10-CM

## 2022-12-02 DIAGNOSIS — Z780 Asymptomatic menopausal state: Secondary | ICD-10-CM | POA: Diagnosis not present

## 2022-12-02 DIAGNOSIS — E559 Vitamin D deficiency, unspecified: Secondary | ICD-10-CM | POA: Diagnosis not present

## 2022-12-02 DIAGNOSIS — Z13820 Encounter for screening for osteoporosis: Secondary | ICD-10-CM | POA: Diagnosis not present

## 2022-12-02 DIAGNOSIS — E1165 Type 2 diabetes mellitus with hyperglycemia: Secondary | ICD-10-CM

## 2022-12-02 DIAGNOSIS — Z0000 Encounter for general adult medical examination without abnormal findings: Secondary | ICD-10-CM

## 2022-12-02 LAB — BASIC METABOLIC PANEL
BUN: 9 mg/dL (ref 6–23)
CO2: 31 meq/L (ref 19–32)
Calcium: 9.7 mg/dL (ref 8.4–10.5)
Chloride: 100 meq/L (ref 96–112)
Creatinine, Ser: 1.08 mg/dL (ref 0.40–1.20)
GFR: 49.18 mL/min — ABNORMAL LOW (ref >60.00)
Glucose, Bld: 99 mg/dL (ref 70–99)
Potassium: 4.2 meq/L (ref 3.5–5.1)
Sodium: 137 meq/L (ref 135–145)

## 2022-12-02 LAB — HEPATIC FUNCTION PANEL
ALT: 8 U/L (ref 0–35)
AST: 14 U/L (ref 0–37)
Albumin: 4 g/dL (ref 3.5–5.2)
Alkaline Phosphatase: 61 U/L (ref 39–117)
Bilirubin, Direct: 0.1 mg/dL (ref 0.0–0.3)
Total Bilirubin: 0.4 mg/dL (ref 0.2–1.2)
Total Protein: 7.3 g/dL (ref 6.0–8.3)

## 2022-12-02 LAB — LIPID PANEL
Cholesterol: 131 mg/dL (ref 0–200)
HDL: 64.1 mg/dL (ref >39.00)
LDL Cholesterol: 50 mg/dL (ref 0–99)
NonHDL: 67.36
Total CHOL/HDL Ratio: 2
Triglycerides: 85 mg/dL (ref 0.0–149.0)
VLDL: 17 mg/dL (ref 0.0–40.0)

## 2022-12-02 LAB — VITAMIN D 25 HYDROXY (VIT D DEFICIENCY, FRACTURES): VITD: 57.25 ng/mL (ref 30.00–100.00)

## 2022-12-02 LAB — VITAMIN B12: Vitamin B-12: >1501 pg/mL — ABNORMAL HIGH (ref 211–911)

## 2022-12-02 LAB — HEMOGLOBIN A1C: Hgb A1c MFr Bld: 6.7 % — ABNORMAL HIGH (ref 4.6–6.5)

## 2022-12-07 ENCOUNTER — Encounter: Payer: Self-pay | Admitting: Internal Medicine

## 2022-12-07 ENCOUNTER — Ambulatory Visit (INDEPENDENT_AMBULATORY_CARE_PROVIDER_SITE_OTHER): Payer: Medicare PPO | Admitting: Internal Medicine

## 2022-12-07 VITALS — BP 132/84 | HR 105 | Temp 98.1°F | Ht 62.5 in | Wt 175.0 lb

## 2022-12-07 DIAGNOSIS — J209 Acute bronchitis, unspecified: Secondary | ICD-10-CM

## 2022-12-07 DIAGNOSIS — J069 Acute upper respiratory infection, unspecified: Secondary | ICD-10-CM | POA: Insufficient documentation

## 2022-12-07 DIAGNOSIS — E1165 Type 2 diabetes mellitus with hyperglycemia: Secondary | ICD-10-CM | POA: Diagnosis not present

## 2022-12-07 DIAGNOSIS — G629 Polyneuropathy, unspecified: Secondary | ICD-10-CM | POA: Diagnosis not present

## 2022-12-07 DIAGNOSIS — M545 Low back pain, unspecified: Secondary | ICD-10-CM

## 2022-12-07 DIAGNOSIS — F17200 Nicotine dependence, unspecified, uncomplicated: Secondary | ICD-10-CM

## 2022-12-07 DIAGNOSIS — E538 Deficiency of other specified B group vitamins: Secondary | ICD-10-CM

## 2022-12-07 DIAGNOSIS — J440 Chronic obstructive pulmonary disease with acute lower respiratory infection: Secondary | ICD-10-CM

## 2022-12-07 DIAGNOSIS — E559 Vitamin D deficiency, unspecified: Secondary | ICD-10-CM

## 2022-12-07 DIAGNOSIS — N6311 Unspecified lump in the right breast, upper outer quadrant: Secondary | ICD-10-CM | POA: Diagnosis not present

## 2022-12-07 DIAGNOSIS — Z7984 Long term (current) use of oral hypoglycemic drugs: Secondary | ICD-10-CM

## 2022-12-07 MED ORDER — AMITRIPTYLINE HCL 50 MG PO TABS: 50.0000 mg | ORAL_TABLET | Freq: Every day | ORAL | 1 refills | Status: DC

## 2022-12-07 NOTE — Assessment & Plan Note (Signed)
Pt counseled to quit, pt not ready 

## 2022-12-07 NOTE — Patient Instructions (Addendum)
Your blood tests look good!  Please have your Shingrix (shingles) shots done at your local pharmacy, and the flu shot as well  Please take all new medication as prescribed - the low dose elavil at bedtime for the neuropathy  Please continue all other medications as before, and refills have been done if requested.  Please have the pharmacy call with any other refills you may need.  Please keep your appointments with your specialists as you may have planned  You will be contacted regarding the referral for: diagnostic mammogram  Please make an Appointment to return in 6 months, or sooner if needed, also with Lab Appointment for testing done 3-5 days before at the FIRST FLOOR Lab (so this is for TWO appointments - please see the scheduling desk as you leave)\

## 2022-12-07 NOTE — Assessment & Plan Note (Signed)
Last vitamin D Lab Results  Component Value Date   VD25OH 57.25 12/02/2022   Stable, cont oral replacement

## 2022-12-07 NOTE — Progress Notes (Signed)
Patient ID: Brittany Oconnor, female   DOB: 1945/12/12, 77 y.o.   MRN: [id number]        Chief Complaint: follow up uri with chest congestion, neuropathy, right lower back pain, right breast lump       HPI:  Brittany Oconnor is a 77 y.o. female Here with acute onset mild to mod 2-3 days ST, HA, general weakness and malaise, with scant non prod cough clear sputum, but Pt denies chest pain, increased sob or doe, wheezing, orthopnea, PND, increased LE swelling, palpitations, dizziness or syncope.Has worsening neuropathy pain at bedtime,  Has also noticed right breast lump on palpation hard to find today x 1 wk, non tender.  Pt continues to have recurring right lumbar LBP without bowel or bladder change, fever, wt loss,  worsening LE numbness/weakness, gait change or falls, with radiation to the right buttock.  Still smoking, not ready to quit       Wt Readings from Last 3 Encounters:  12/07/22 175 lb (79.4 kg)  11/01/22 180 lb 6.4 oz (81.8 kg)  05/27/22 195 lb (88.5 kg)   BP Readings from Last 3 Encounters:  12/07/22 132/84  05/27/22 122/68  03/19/22 (!) 174/92         Past Medical History:  Diagnosis Date   ALLERGIC RHINITIS 11/24/2006   Allergy    ANXIETY 07/10/2008   DIABETES MELLITUS, TYPE II 11/24/2006   GERD (gastroesophageal reflux disease)    History of shingles    HYPERLIPIDEMIA 11/24/2006   Past Surgical History:  Procedure Laterality Date   BREAST BIOPSY Right 03/11/2015   benign   EYE SURGERY Bilateral 2022   Cataract surgery   MOUTH SURGERY      reports that she has been smoking cigarettes. She has a 23.5 pack-year smoking history. She has never used smokeless tobacco. She reports that she does not drink alcohol and does not use drugs. family history includes Appendicitis in her father; Colon cancer (age of onset: 54) in her sister; Colon cancer (age of onset: 52) in her sister; Heart disease in her sister; Hypertension in her father; Stroke in her mother. Allergies  Allergen  Reactions   Shellfish Allergy Hives   Doxycycline    Current Outpatient Medications on File Prior to Visit  Medication Sig Dispense Refill   aspirin 81 MG tablet Take 81 mg by mouth daily.     cholecalciferol (VITAMIN D3) 25 MCG (1000 UNIT) tablet Take 50 mcg by mouth daily.     cyanocobalamin (VITAMIN B12) 1000 MCG tablet Take 1,000 mcg by mouth daily.     metFORMIN (GLUCOPHAGE-XR) 500 MG 24 hr tablet TAKE 3 TABLETS BY MOUTH EVERY MORNING 270 tablet 3   simvastatin (ZOCOR) 20 MG tablet TAKE 1 TABLET BY MOUTH EVERY DAY IN THE EVENING 90 tablet 3   Lancets MISC Apply 1 Device topically daily. E11.9 (Patient not taking: Reported on 05/26/2021) 100 each 3   traMADol (ULTRAM) 50 MG tablet Take 1 tablet (50 mg total) by mouth every 6 (six) hours as needed (pain). (Patient not taking: Reported on 11/01/2022) 12 tablet 0   No current facility-administered medications on file prior to visit.        ROS:  All others reviewed and negative.  Objective        PE:  BP 132/84 (BP Location: Right Arm, Patient Position: Sitting, Cuff Size: Normal)   Pulse (!) 105   Temp 98.1 F (36.7 C) (Oral)   Ht 5' 2.5" (1.588 m)  Wt 175 lb (79.4 kg)   SpO2 99%   BMI 31.50 kg/m                 Constitutional: Pt appears in NAD               HENT: Head: NCAT.                Right Ear: External ear normal.                 Left Ear: External ear normal. Bilat tm's with mild erythema.  Max sinus areas non tender.  Pharynx with mild erythema, no exudate               Eyes: . Pupils are equal, round, and reactive to light. Conjunctivae and EOM are normal               Nose: without d/c or deformity               Neck: Neck supple. Gross normal ROM               Cardiovascular: Normal rate and regular rhythm.                 Pulmonary/Chest: Effort normal and breath sounds without rales or wheezing.                Abd:  Soft, NT, ND, + BS, no organomegaly               Neurological: Pt is alert. At baseline  orientation, motor grossly intact               Skin: Skin is warm. No rashes, no other new lesions, LE edema - none               Psychiatric: Pt behavior is normal without agitation   Micro: none  Cardiac tracings I have personally interpreted today:  none  Pertinent Radiological findings (summarize): none   Lab Results  Component Value Date   WBC 6.6 05/26/2022   HGB 13.9 05/26/2022   HCT 41.9 05/26/2022   PLT 237.0 05/26/2022   GLUCOSE 99 12/02/2022   CHOL 131 12/02/2022   TRIG 85.0 12/02/2022   HDL 64.10 12/02/2022   LDLDIRECT 99.9 03/07/2007   LDLCALC 50 12/02/2022   ALT 8 12/02/2022   AST 14 12/02/2022   NA 137 12/02/2022   K 4.2 12/02/2022   CL 100 12/02/2022   CREATININE 1.08 12/02/2022   BUN 9 12/02/2022   CO2 31 12/02/2022   TSH 1.51 05/26/2022   HGBA1C 6.7 (H) 12/02/2022   MICROALBUR 19.3 (H) 05/26/2022   Assessment/Plan:  Brittany Oconnor is a 77 y.o. Black or African American [2] female with  has a past medical history of ALLERGIC RHINITIS (11/24/2006), Allergy, ANXIETY (07/10/2008), DIABETES MELLITUS, TYPE II (11/24/2006), GERD (gastroesophageal reflux disease), History of shingles, and HYPERLIPIDEMIA (11/24/2006).  COPD (chronic obstructive pulmonary disease) with acute bronchitis (HCC) Overall stable, to continue inhaler prn  Acute URI Exam cw viral illness, for delsym prn, mucinex bid prn,  to f/u any worsening symptoms or concerns   Diabetes Lab Results  Component Value Date   HGBA1C 6.7 (H) 12/02/2022   Stable, pt to continue current medical treatment metformin ER 500 mg - 3 qd   Neuropathy With worsening bedtime pain - for elavil 50 qhs  Smoker Pt counseled to quit, pt not ready  Vitamin D deficiency Last vitamin D  Lab Results  Component Value Date   VD25OH 57.25 12/02/2022   Stable, cont oral replacement   Mass of upper outer quadrant of right breast Mass palpated to breast tail per pt I cannot appreciate today, but will have  diagnostic mammogram  Low back pain Mild to mod, declines prednisone or gabapentin for now , declines imaging for now,  to f/u any worsening symptoms or concerns  Followup: Return in about 6 months (around 06/06/2023).  Oliver Barre, MD 12/07/2022 10:02 PM [facility]

## 2022-12-07 NOTE — Assessment & Plan Note (Signed)
Overall stable, to continue inhaler prn

## 2022-12-07 NOTE — Assessment & Plan Note (Signed)
Lab Results  Component Value Date   HGBA1C 6.7 (H) 12/02/2022   Stable, pt to continue current medical treatment metformin ER 500 mg - 3 qd

## 2022-12-07 NOTE — Addendum Note (Signed)
Addended by: Corwin Levins on: 12/07/2022 10:03 PM   Modules accepted: Orders

## 2022-12-07 NOTE — Assessment & Plan Note (Signed)
With worsening bedtime pain - for elavil 50 qhs

## 2022-12-07 NOTE — Assessment & Plan Note (Signed)
Mild to mod, declines prednisone or gabapentin for now , declines imaging for now,  to f/u any worsening symptoms or concerns

## 2022-12-07 NOTE — Assessment & Plan Note (Signed)
Mass palpated to breast tail per pt I cannot appreciate today, but will have diagnostic mammogram

## 2022-12-07 NOTE — Assessment & Plan Note (Signed)
Exam cw viral illness, for delsym prn, mucinex bid prn,  to f/u any worsening symptoms or concerns

## 2022-12-08 NOTE — Progress Notes (Addendum)
Subjective:   Brittany Oconnor is a 77 y.o. female who presents for Medicare Annual (Subsequent) preventive examination.  Visit Complete: In person   Review of Systems    Cardiac Risk Factors include: advanced age (>84men, >40 women);diabetes mellitus;dyslipidemia;Other (see comment), Risk factor comments: COPD     Objective:    Today's Vitals   11/01/22 1434  Weight: 180 lb 6.4 oz (81.8 kg)  Height: 5' 2.5" (1.588 m)   Body mass index is 32.47 kg/m.     11/01/2022    2:48 PM 11/16/2021    1:37 PM 11/13/2020   12:38 PM 05/28/2015    9:57 AM  Advanced Directives  Does Patient Have a Medical Advance Directive? Yes No No No  Type of Estate agent of [place];Living will     Copy of Healthcare Power of Attorney in Chart? No - copy requested     Would patient like information on creating a medical advance directive?  No - Patient declined No - Patient declined     Current Medications (verified) Outpatient Encounter Medications as of 11/01/2022  Medication Sig   aspirin 81 MG tablet Take 81 mg by mouth daily.   cholecalciferol (VITAMIN D3) 25 MCG (1000 UNIT) tablet Take 50 mcg by mouth daily.   cyanocobalamin (VITAMIN B12) 1000 MCG tablet Take 1,000 mcg by mouth daily.   metFORMIN (GLUCOPHAGE-XR) 500 MG 24 hr tablet TAKE 3 TABLETS BY MOUTH EVERY MORNING   simvastatin (ZOCOR) 20 MG tablet TAKE 1 TABLET BY MOUTH EVERY DAY IN THE EVENING   Lancets MISC Apply 1 Device topically daily. E11.9 (Patient not taking: Reported on 05/26/2021)   traMADol (ULTRAM) 50 MG tablet Take 1 tablet (50 mg total) by mouth every 6 (six) hours as needed (pain). (Patient not taking: Reported on 11/01/2022)   No facility-administered encounter medications on file as of 11/01/2022.    Allergies (verified) Shellfish allergy and Doxycycline   History: Past Medical History:  Diagnosis Date   ALLERGIC RHINITIS 11/24/2006   Allergy    ANXIETY 07/10/2008   DIABETES MELLITUS, TYPE II 11/24/2006    GERD (gastroesophageal reflux disease)    History of shingles    HYPERLIPIDEMIA 11/24/2006   Past Surgical History:  Procedure Laterality Date   BREAST BIOPSY Right 03/11/2015   benign   EYE SURGERY Bilateral 2022   Cataract surgery   MOUTH SURGERY     Family History  Problem Relation Age of Onset   Colon cancer Sister 2       deceased    Heart disease Sister        died of heart attack   Colon cancer Sister 67       alive   Hypertension Father    Appendicitis Father        rupture   Stroke Mother        stroke 59;died age 68   Social History   Socioeconomic History   Marital status: Married    Spouse name: Bruce   Number of children: 1   Years of education: Not on file   Highest education level: Not on file  Occupational History   Not on file  Tobacco Use   Smoking status: Every Day    Current packs/day: 0.50    Average packs/day: 0.5 packs/day for 47.0 years (23.5 ttl pk-yrs)    Types: Cigarettes   Smokeless tobacco: Never   Tobacco comments:    0.5 ppd  Vaping Use   Vaping status:  Never Used  Substance and Sexual Activity   Alcohol use: No    Alcohol/week: 0.0 standard drinks of alcohol   Drug use: No   Sexual activity: Not on file  Other Topics Concern   Not on file  Social History Narrative   Not on file   Social Determinants of Health   Financial Resource Strain: Low Risk  (11/01/2022)   Overall Financial Resource Strain (CARDIA)    Difficulty of Paying Living Expenses: Not hard at all  Food Insecurity: No Food Insecurity (11/01/2022)   Hunger Vital Sign    Worried About Running Out of Food in the Last Year: Never true    Ran Out of Food in the Last Year: Never true  Transportation Needs: No Transportation Needs (11/01/2022)   PRAPARE - Administrator, Civil Service (Medical): No    Lack of Transportation (Non-Medical): No  Physical Activity: Inactive (11/01/2022)   Exercise Vital Sign    Days of Exercise per Week: 0 days    Minutes  of Exercise per Session: 0 min  Stress: No Stress Concern Present (11/16/2021)   Brittany Oconnor of Occupational Health - Occupational Stress Questionnaire    Feeling of Stress : Not at all  Social Connections: Moderately Isolated (11/01/2022)   Social Connection and Isolation Panel [NHANES]    Frequency of Communication with Friends and Family: More than three times a week    Frequency of Social Gatherings with Friends and Family: Once a week    Attends Religious Services: Never    Database administrator or Organizations: No    Attends Engineer, structural: Never    Marital Status: Married    Tobacco Counseling Ready to quit: Not Answered Counseling given: Not Answered Tobacco comments: 0.5 ppd   Clinical Intake:  Pre-visit preparation completed: Yes  Pain : No/denies pain (has worsening numbness in left leg)     BMI - recorded: 32.47 Nutritional Status: BMI > 30  Obese Nutritional Risks: None Diabetes: Yes CBG done?: No Did pt. bring in CBG monitor from home?: No  How often do you need to have someone help you when you read instructions, pamphlets, or other written materials from your doctor or pharmacy?: 1 - Never  Interpreter Needed?: No  Information entered by :: Pebbles Zeiders, RMA   Activities of Daily Living    11/01/2022    2:37 PM  In your present state of health, do you have any difficulty performing the following activities:  Hearing? 0  Vision? 1  Difficulty concentrating or making decisions? 0  Walking or climbing stairs? 0  Dressing or bathing? 0  Doing errands, shopping? 0  Preparing Food and eating ? N  Using the Toilet? N  In the past six months, have you accidently leaked urine? N  Do you have problems with loss of bowel control? N  Managing your Medications? N  Managing your Finances? N  Housekeeping or managing your Housekeeping? N    Patient Care Team: Corwin Levins, MD as PCP - General  Indicate any recent Medical Services  you may have received from other than [facility] providers in the past year (date may be approximate).     Assessment:   This is a routine wellness examination for Brittany Oconnor.  Hearing/Vision screen Hearing Screening - Comments:: Denies hearing difficulties   Vision Screening - Comments:: Cataract surgery  Dietary issues and exercise activities discussed:     Goals Addressed   None   Depression  Screen    12/07/2022    1:55 PM 11/01/2022    2:57 PM 05/27/2022    2:16 PM 12/04/2021    2:41 PM 11/16/2021    1:49 PM 05/26/2021    1:48 PM 11/25/2020   11:40 AM  PHQ 2/9 Scores  PHQ - 2 Score 0 0 0  0 0 0  PHQ- 9 Score 0 0 0      Exception Documentation    Patient refusal       Fall Risk    12/07/2022    1:55 PM 11/01/2022    2:49 PM 05/27/2022    2:30 PM 05/27/2022    2:12 PM 12/04/2021    2:41 PM  Fall Risk   Falls in the past year? 0 1 1 1  0  Number falls in past yr: 0 0 0 0   Injury with Fall? 0 1 1 1  0  Comment  Urgent care/chipped bone right hand finger splinter fracture  in dec 2023    Risk for fall due to : No Fall Risks   Impaired balance/gait No Fall Risks  Follow up Falls evaluation completed   Falls evaluation completed;Education provided Falls evaluation completed    MEDICARE RISK AT HOME:    TIMED UP AND GO:  Was the test performed?  Yes  Length of time to ambulate 10 feet: 20 sec Gait steady and fast without use of assistive device    Cognitive Function:        11/01/2022    2:51 PM 11/16/2021    1:50 PM  6CIT Screen  What Year? 0 points 0 points  What month? 0 points 0 points  What time? 0 points 0 points  Count back from 20 0 points 0 points  Months in reverse 0 points 0 points  Repeat phrase 0 points 0 points  Total Score 0 points 0 points    Immunizations Immunization History  Administered Date(s) Administered   Fluad Quad(high Dose 65+) 11/16/2018   Influenza Whole 01/27/2010   PFIZER(Purple Top)SARS-COV-2 Vaccination 05/05/2019, 05/26/2019,  12/24/2019, 06/27/2020   Pneumococcal Conjugate-13 05/17/2014   Pneumococcal Polysaccharide-23 07/10/2008, 05/10/2017   Td 07/10/2008   Tdap 11/16/2018   Zoster, Live 07/10/2008    TDAP status: Up to date  Flu Vaccine status: Due, Education has been provided regarding the importance of this vaccine. Advised may receive this vaccine at local pharmacy or Health Dept. Aware to provide a copy of the vaccination record if obtained from local pharmacy or Health Dept. Verbalized acceptance and understanding.  Pneumococcal vaccine status: Up to date  Covid-19 vaccine status: Completed vaccines  Qualifies for Shingles Vaccine? Yes   Zostavax completed Yes   Shingrix Completed?: No.    Education has been provided regarding the importance of this vaccine. Patient has been advised to call insurance company to determine out of pocket expense if they have not yet received this vaccine. Advised may also receive vaccine at local pharmacy or Health Dept. Verbalized acceptance and understanding.  Screening Tests Health Maintenance  Topic Date Due   Zoster Vaccines- Shingrix (1 of 2) 05[birth year]14/1964   INFLUENZA VACCINE  10/28/2022   Lung Cancer Screening  12/23/2022   OPHTHALMOLOGY EXAM  01/12/2023   Diabetic kidney evaluation - Urine ACR  05/27/2023   FOOT EXAM  05/27/2023   HEMOGLOBIN A1C  06/01/2023   Diabetic kidney evaluation - eGFR measurement  12/02/2023   Medicare Annual Wellness (AWV)  12/02/2023   DTaP/Tdap/Td (3 - Td or Tdap) 11/15/2028  Pneumonia Vaccine 38+ Years old  Completed   DEXA SCAN  Completed   Hepatitis C Screening  Completed   HPV VACCINES  Aged Out   Colonoscopy  Discontinued   COVID-19 Vaccine  Discontinued    Health Maintenance  Health Maintenance Due  Topic Date Due   Zoster Vaccines- Shingrix (1 of 2) 05/14/1964   INFLUENZA VACCINE  10/28/2022   Lung Cancer Screening  12/23/2022    Colorectal cancer screening: No longer required.   Mammogram status: Completed  01/19/2022. Repeat every year  Bone Density status: Ordered 11/01/2022. Pt provided with contact info and advised to call to schedule appt.  Lung Cancer Screening: (Low Dose CT Chest recommended if Age 42-80 years, 20 pack-year currently smoking OR have quit w/in 15years.) does qualify.   Lung Cancer Screening Referral: Screening due 12/23/2022  Additional Screening:  Hepatitis C Screening: does qualify; Completed 05/10/2017  Vision Screening: Recommended annual ophthalmology exams for early detection of glaucoma and other disorders of the eye. Is the patient up to date with their annual eye exam?  Yes  Who is the provider or what is the name of the office in which the patient attends annual eye exams? Dr. Laruth Bouchard office If pt is not established with a provider, would they like to be referred to a provider to establish care? No .   Dental Screening: Recommended annual dental exams for proper oral hygiene  Diabetic Foot Exam: Diabetic Foot Exam: Completed 05/27/2022  Community Resource Referral / Chronic Care Management: CRR required this visit?  No   CCM required this visit?  No     Plan:     I have personally reviewed and noted the following in the patient's chart:   Medical and social history Use of alcohol, tobacco or illicit drugs  Current medications and supplements including opioid prescriptions. Patient is currently taking opioid prescriptions. Information provided to patient regarding non-opioid alternatives. Patient advised to discuss non-opioid treatment plan with their provider. Functional ability and status Nutritional status Physical activity Advanced directives List of other physicians Hospitalizations, surgeries, and ER visits in previous 12 months Vitals Screenings to include cognitive, depression, and falls Referrals and appointments  In addition, I have reviewed and discussed with patient certain preventive protocols, quality metrics, and best practice  recommendations. A written personalized care plan for preventive services as well as general preventive health recommendations were provided to patient.     Lovelyn Sheeran L Dewitte Vannice, CMA   11/01/2022  After Visit Summary: (MyChart) Due to this being a telephonic visit, the after visit summary with patients personalized plan was offered to patient via MyChart   Nurse Notes: Patient refused a blood pressure reading today.  She stated "it's always high in doctors office".  A referral for a DEXA has been placed today and patient is aware to call and schedule.  She also needs to call to schedule her mammogram that due in October.  Patient is due for Lung screening coming up in September 2024.

## 2022-12-15 ENCOUNTER — Telehealth: Payer: Self-pay | Admitting: Internal Medicine

## 2022-12-15 MED ORDER — PREDNISONE 10 MG PO TABS: ORAL_TABLET | ORAL | 0 refills | Status: DC

## 2022-12-15 NOTE — Telephone Encounter (Signed)
Ok done erx 

## 2022-12-15 NOTE — Telephone Encounter (Signed)
Patient was seen by Dr. Jonny Ruiz on 12/07/22 and he offered to prescribe prednisone. Patient declined at the time, but she said she is not feeling better and would like to know if he can still prescribe that. If so, she would like for it to be sent to [id number] - [place], [place] - [address]. Otherwise, she would like a call back at [phone].

## 2022-12-24 ENCOUNTER — Ambulatory Visit (INDEPENDENT_AMBULATORY_CARE_PROVIDER_SITE_OTHER): Payer: Medicare PPO | Admitting: Internal Medicine

## 2022-12-24 ENCOUNTER — Ambulatory Visit (INDEPENDENT_AMBULATORY_CARE_PROVIDER_SITE_OTHER): Payer: Medicare PPO

## 2022-12-24 ENCOUNTER — Encounter: Payer: Self-pay | Admitting: Internal Medicine

## 2022-12-24 VITALS — BP 124/74 | HR 74 | Temp 98.9°F | Ht 62.5 in | Wt 170.0 lb

## 2022-12-24 DIAGNOSIS — Z7984 Long term (current) use of oral hypoglycemic drugs: Secondary | ICD-10-CM

## 2022-12-24 DIAGNOSIS — E559 Vitamin D deficiency, unspecified: Secondary | ICD-10-CM | POA: Diagnosis not present

## 2022-12-24 DIAGNOSIS — R059 Cough, unspecified: Secondary | ICD-10-CM

## 2022-12-24 DIAGNOSIS — R062 Wheezing: Secondary | ICD-10-CM

## 2022-12-24 DIAGNOSIS — E1165 Type 2 diabetes mellitus with hyperglycemia: Secondary | ICD-10-CM

## 2022-12-24 DIAGNOSIS — J441 Chronic obstructive pulmonary disease with (acute) exacerbation: Secondary | ICD-10-CM

## 2022-12-24 DIAGNOSIS — I700 Atherosclerosis of aorta: Secondary | ICD-10-CM | POA: Diagnosis not present

## 2022-12-24 DIAGNOSIS — F17200 Nicotine dependence, unspecified, uncomplicated: Secondary | ICD-10-CM | POA: Diagnosis not present

## 2022-12-24 MED ORDER — PREDNISONE 10 MG PO TABS: ORAL_TABLET | ORAL | 0 refills | Status: DC

## 2022-12-24 MED ORDER — HYDROCODONE BIT-HOMATROP MBR 5-1.5 MG/5ML PO SOLN: 5.0000 mL | Freq: Four times a day (QID) | ORAL | 0 refills | Status: AC | PRN

## 2022-12-24 MED ORDER — LEVOFLOXACIN 500 MG PO TABS: 500.0000 mg | ORAL_TABLET | Freq: Every day | ORAL | 0 refills | Status: AC

## 2022-12-24 MED ORDER — METHYLPREDNISOLONE ACETATE 80 MG/ML IJ SUSP
80.0000 mg | Freq: Once | INTRAMUSCULAR | Status: AC
Start: 2022-12-24 — End: 2022-12-24
  Administered 2022-12-24: 80 mg via INTRAMUSCULAR

## 2022-12-24 NOTE — Patient Instructions (Signed)
You had the steroid shot today  Please take all new medication as prescribed - the antibiotic, cough medicine, and prednisone  Please continue all other medications as before, and refills have been done if requested.  Please have the pharmacy call with any other refills you may need.  Please keep your appointments with your specialists as you may have planned  Please go to the XRAY Department in the first floor for the x-ray testing  You will be contacted by phone if any changes need to be made immediately.  Otherwise, you will receive a letter about your results with an explanation, but please check with MyChart first.

## 2022-12-24 NOTE — Progress Notes (Unsigned)
Patient ID: Brittany Oconnor, female   DOB: 1945-09-21, 77 y.o.   MRN: [id number]        Chief Complaint: follow up cough, wheezing, dm, smoker, low vit d       HPI:  Brittany Oconnor is a 77 y.o. female Here with acute onset mild to mod 2-3 days ST, HA, general weakness and malaise, with prod cough greenish sputum, but Pt denies chest pain, increased sob or doe, wheezing, orthopnea, PND, increased LE swelling, palpitations, dizziness or syncope, except for mild worsening wheezing sob in the past day.   Pt denies polydipsia, polyuria, or new focal neuro s/s.          Wt Readings from Last 3 Encounters:  12/24/22 170 lb (77.1 kg)  12/07/22 175 lb (79.4 kg)  11/01/22 180 lb 6.4 oz (81.8 kg)   BP Readings from Last 3 Encounters:  12/24/22 124/74  12/07/22 132/84  05/27/22 122/68         Past Medical History:  Diagnosis Date   ALLERGIC RHINITIS 11/24/2006   Allergy    ANXIETY 07/10/2008   DIABETES MELLITUS, TYPE II 11/24/2006   GERD (gastroesophageal reflux disease)    History of shingles    HYPERLIPIDEMIA 11/24/2006   Past Surgical History:  Procedure Laterality Date   BREAST BIOPSY Right 03/11/2015   benign   EYE SURGERY Bilateral 2022   Cataract surgery   MOUTH SURGERY      reports that she has been smoking cigarettes. She has a 23.5 pack-year smoking history. She has never used smokeless tobacco. She reports that she does not drink alcohol and does not use drugs. family history includes Appendicitis in her father; Colon cancer (age of onset: 49) in her sister; Colon cancer (age of onset: 45) in her sister; Heart disease in her sister; Hypertension in her father; Stroke in her mother. Allergies  Allergen Reactions   Shellfish Allergy Hives   Doxycycline    Current Outpatient Medications on File Prior to Visit  Medication Sig Dispense Refill   amitriptyline (ELAVIL) 50 MG tablet Take 1 tablet (50 mg total) by mouth at bedtime. 90 tablet 1   aspirin 81 MG tablet Take 81 mg by mouth  daily.     cholecalciferol (VITAMIN D3) 25 MCG (1000 UNIT) tablet Take 50 mcg by mouth daily.     cyanocobalamin (VITAMIN B12) 1000 MCG tablet Take 1,000 mcg by mouth daily.     metFORMIN (GLUCOPHAGE-XR) 500 MG 24 hr tablet TAKE 3 TABLETS BY MOUTH EVERY MORNING 270 tablet 3   simvastatin (ZOCOR) 20 MG tablet TAKE 1 TABLET BY MOUTH EVERY DAY IN THE EVENING 90 tablet 3   Lancets MISC Apply 1 Device topically daily. E11.9 (Patient not taking: Reported on 05/26/2021) 100 each 3   predniSONE (DELTASONE) 10 MG tablet 2 tabs by mouth per day for 5 days (Patient not taking: Reported on 12/24/2022) 10 tablet 0   traMADol (ULTRAM) 50 MG tablet Take 1 tablet (50 mg total) by mouth every 6 (six) hours as needed (pain). (Patient not taking: Reported on 11/01/2022) 12 tablet 0   No current facility-administered medications on file prior to visit.        ROS:  All others reviewed and negative.  Objective        PE:  BP 124/74 (BP Location: Left Arm, Patient Position: Sitting, Cuff Size: Normal)   Pulse 74   Temp 98.9 F (37.2 C) (Oral)   Ht 5' 2.5" (1.588 m)  Wt 170 lb (77.1 kg)   SpO2 99%   BMI 30.60 kg/m                 Constitutional: Pt appears in NAD, mild ill               HENT: Head: NCAT.                Right Ear: External ear normal.                 Left Ear: External ear normal. Bilat tm's with mild erythema.  Max sinus areas non tender.  Pharynx with mild erythema, no exudate                Eyes: . Pupils are equal, round, and reactive to light. Conjunctivae and EOM are normal               Nose: without d/c or deformity               Neck: Neck supple. Gross normal ROM               Cardiovascular: Normal rate and regular rhythm.                 Pulmonary/Chest: Effort normal and breath sounds without rales but with few bilat wheezing.                Abd:  Soft, NT, ND, + BS, no organomegaly               Neurological: Pt is alert. At baseline orientation, motor grossly intact                Skin: Skin is warm. No rashes, no other new lesions, LE edema - none               Psychiatric: Pt behavior is normal without agitation   Micro: none  Cardiac tracings I have personally interpreted today:  none  Pertinent Radiological findings (summarize): none   Lab Results  Component Value Date   WBC 6.6 05/26/2022   HGB 13.9 05/26/2022   HCT 41.9 05/26/2022   PLT 237.0 05/26/2022   GLUCOSE 99 12/02/2022   CHOL 131 12/02/2022   TRIG 85.0 12/02/2022   HDL 64.10 12/02/2022   LDLDIRECT 99.9 03/07/2007   LDLCALC 50 12/02/2022   ALT 8 12/02/2022   AST 14 12/02/2022   NA 137 12/02/2022   K 4.2 12/02/2022   CL 100 12/02/2022   CREATININE 1.08 12/02/2022   BUN 9 12/02/2022   CO2 31 12/02/2022   TSH 1.51 05/26/2022   HGBA1C 6.7 (H) 12/02/2022   MICROALBUR 19.3 (H) 05/26/2022   Assessment/Plan:  Brittany Oconnor is a 77 y.o. Black or African American [2] female with  has a past medical history of ALLERGIC RHINITIS (11/24/2006), Allergy, ANXIETY (07/10/2008), DIABETES MELLITUS, TYPE II (11/24/2006), GERD (gastroesophageal reflux disease), History of shingles, and HYPERLIPIDEMIA (11/24/2006).  COPD exacerbation (HCC) Mild to mod, for depomedrol 80 mg IM, prednisone taper, cont inhaler,  to f/u any worsening symptoms or concerns  Cough Mild to mod, can't r/o bronchitis vs pna, for cxr , also for antibx course levaquin 500 every day and cough med prn,,  to f/u any worsening symptoms or concerns  Diabetes Lab Results  Component Value Date   HGBA1C 6.7 (H) 12/02/2022   Stable, pt to continue current medical treatment metformiin Er 500 mg - 3 qd  Smoker Pt counsled to quit, pt not ready  Vitamin D deficiency Last vitamin D Lab Results  Component Value Date   VD25OH 57.25 12/02/2022   Stable, cont oral replacement  Followup: Return if symptoms worsen or fail to improve.  Brittany Barre, MD 12/26/2022 2:14 PM [facility] [place] [facility] - [facility]

## 2022-12-26 ENCOUNTER — Encounter: Payer: Self-pay | Admitting: Internal Medicine

## 2022-12-26 NOTE — Assessment & Plan Note (Signed)
Pt counsled to quit, pt not ready °

## 2022-12-26 NOTE — Assessment & Plan Note (Signed)
Lab Results  Component Value Date   HGBA1C 6.7 (H) 12/02/2022   Stable, pt to continue current medical treatment metformiin Er 500 mg - 3 qd

## 2022-12-26 NOTE — Assessment & Plan Note (Signed)
Mild to mod, can't r/o bronchitis vs pna, for cxr , also for antibx course levaquin 500 every day and cough med prn,,  to f/u any worsening symptoms or concerns

## 2022-12-26 NOTE — Assessment & Plan Note (Signed)
Last vitamin D Lab Results  Component Value Date   VD25OH 57.25 12/02/2022   Stable, cont oral replacement

## 2022-12-26 NOTE — Assessment & Plan Note (Signed)
Mild to mod, for depomedrol 80 mg IM, prednisone taper, cont inhaler,  to f/u any worsening symptoms or concerns

## 2022-12-27 ENCOUNTER — Other Ambulatory Visit: Payer: Self-pay | Admitting: Internal Medicine

## 2022-12-27 ENCOUNTER — Other Ambulatory Visit: Payer: Self-pay

## 2023-01-14 NOTE — Progress Notes (Signed)
The test results show that your current treatment is OK, as the xray is negative.  Please continue the same plan.  There is no other need for change of treatment or further evaluation based on these results, at this time.  thanks

## 2023-01-21 ENCOUNTER — Ambulatory Visit
Admission: RE | Admit: 2023-01-21 | Discharge: 2023-01-21 | Disposition: A | Discharge status: Home / Self Care | Payer: Medicare PPO | Source: Ambulatory Visit | Attending: Internal Medicine | Admitting: Internal Medicine

## 2023-01-21 ENCOUNTER — Other Ambulatory Visit: Payer: Self-pay | Admitting: Internal Medicine

## 2023-01-21 DIAGNOSIS — J209 Acute bronchitis, unspecified: Secondary | ICD-10-CM

## 2023-01-21 DIAGNOSIS — G629 Polyneuropathy, unspecified: Secondary | ICD-10-CM

## 2023-01-21 DIAGNOSIS — E1165 Type 2 diabetes mellitus with hyperglycemia: Secondary | ICD-10-CM

## 2023-01-21 DIAGNOSIS — N6311 Unspecified lump in the right breast, upper outer quadrant: Secondary | ICD-10-CM

## 2023-01-21 DIAGNOSIS — E559 Vitamin D deficiency, unspecified: Secondary | ICD-10-CM

## 2023-01-21 DIAGNOSIS — N6459 Other signs and symptoms in breast: Secondary | ICD-10-CM | POA: Diagnosis not present

## 2023-01-21 DIAGNOSIS — J069 Acute upper respiratory infection, unspecified: Secondary | ICD-10-CM

## 2023-01-21 DIAGNOSIS — R2231 Localized swelling, mass and lump, right upper limb: Secondary | ICD-10-CM | POA: Diagnosis not present

## 2023-01-21 DIAGNOSIS — M545 Low back pain, unspecified: Secondary | ICD-10-CM

## 2023-01-21 DIAGNOSIS — F17200 Nicotine dependence, unspecified, uncomplicated: Secondary | ICD-10-CM

## 2023-01-21 DIAGNOSIS — E538 Deficiency of other specified B group vitamins: Secondary | ICD-10-CM

## 2023-06-03 ENCOUNTER — Other Ambulatory Visit

## 2023-06-03 DIAGNOSIS — E538 Deficiency of other specified B group vitamins: Secondary | ICD-10-CM

## 2023-06-03 DIAGNOSIS — E1165 Type 2 diabetes mellitus with hyperglycemia: Secondary | ICD-10-CM

## 2023-06-03 DIAGNOSIS — E559 Vitamin D deficiency, unspecified: Secondary | ICD-10-CM | POA: Diagnosis not present

## 2023-06-03 LAB — CBC WITH DIFFERENTIAL/PLATELET
Basophils Absolute: 0 10*3/uL (ref 0.0–0.1)
Basophils Relative: 0.3 % (ref 0.0–3.0)
Eosinophils Absolute: 0.1 10*3/uL (ref 0.0–0.7)
Eosinophils Relative: 1 % (ref 0.0–5.0)
HCT: 43.1 % (ref 36.0–46.0)
Hemoglobin: 14 g/dL (ref 12.0–15.0)
Lymphocytes Relative: 34.8 % (ref 12.0–46.0)
Lymphs Abs: 2.6 10*3/uL (ref 0.7–4.0)
MCHC: 32.5 g/dL (ref 30.0–36.0)
MCV: 85.5 fl (ref 78.0–100.0)
Monocytes Absolute: 0.6 10*3/uL (ref 0.1–1.0)
Monocytes Relative: 7.9 % (ref 3.0–12.0)
Neutro Abs: 4.2 10*3/uL (ref 1.4–7.7)
Neutrophils Relative %: 56 % (ref 43.0–77.0)
Platelets: 243 10*3/uL (ref 150.0–400.0)
RBC: 5.04 Mil/uL (ref 3.87–5.11)
RDW: 14 % (ref 11.5–15.5)
WBC: 7.5 10*3/uL (ref 4.0–10.5)

## 2023-06-03 LAB — URINALYSIS, ROUTINE W REFLEX MICROSCOPIC
Bilirubin Urine: NEGATIVE
Hgb urine dipstick: NEGATIVE
Ketones, ur: NEGATIVE
Nitrite: NEGATIVE
Specific Gravity, Urine: 1.01 (ref 1.000–1.030)
Total Protein, Urine: NEGATIVE
Urine Glucose: NEGATIVE
Urobilinogen, UA: 0.2 (ref 0.0–1.0)
pH: 6 (ref 5.0–8.0)

## 2023-06-03 LAB — VITAMIN D 25 HYDROXY (VIT D DEFICIENCY, FRACTURES): VITD: 53.37 ng/mL (ref 30.00–100.00)

## 2023-06-03 LAB — HEPATIC FUNCTION PANEL
ALT: 8 U/L (ref 0–35)
AST: 14 U/L (ref 0–37)
Albumin: 4.2 g/dL (ref 3.5–5.2)
Alkaline Phosphatase: 56 U/L (ref 39–117)
Bilirubin, Direct: 0.1 mg/dL (ref 0.0–0.3)
Total Bilirubin: 0.5 mg/dL (ref 0.2–1.2)
Total Protein: 6.9 g/dL (ref 6.0–8.3)

## 2023-06-03 LAB — BASIC METABOLIC PANEL
BUN: 16 mg/dL (ref 6–23)
CO2: 30 meq/L (ref 19–32)
Calcium: 9.6 mg/dL (ref 8.4–10.5)
Chloride: 101 meq/L (ref 96–112)
Creatinine, Ser: 0.91 mg/dL (ref 0.40–1.20)
GFR: 60.19 mL/min (ref >60.00)
Glucose, Bld: 101 mg/dL — ABNORMAL HIGH (ref 70–99)
Potassium: 3.9 meq/L (ref 3.5–5.1)
Sodium: 141 meq/L (ref 135–145)

## 2023-06-03 LAB — LIPID PANEL
Cholesterol: 157 mg/dL (ref 0–200)
HDL: 62.8 mg/dL (ref >39.00)
LDL Cholesterol: 69 mg/dL (ref 0–99)
NonHDL: 94.18
Total CHOL/HDL Ratio: 2
Triglycerides: 125 mg/dL (ref 0.0–149.0)
VLDL: 25 mg/dL (ref 0.0–40.0)

## 2023-06-03 LAB — MICROALBUMIN / CREATININE URINE RATIO
Creatinine,U: 89.5 mg/dL
Microalb Creat Ratio: 45.9 mg/g — ABNORMAL HIGH (ref 0.0–30.0)
Microalb, Ur: 4.1 mg/dL — ABNORMAL HIGH (ref 0.0–1.9)

## 2023-06-03 LAB — TSH: TSH: 1.2 u[IU]/mL (ref 0.35–5.50)

## 2023-06-03 LAB — HEMOGLOBIN A1C: Hgb A1c MFr Bld: 6.9 % — ABNORMAL HIGH (ref 4.6–6.5)

## 2023-06-03 LAB — VITAMIN B12: Vitamin B-12: 1474 pg/mL — ABNORMAL HIGH (ref 211–911)

## 2023-06-06 ENCOUNTER — Ambulatory Visit: Payer: Medicare PPO | Admitting: Internal Medicine

## 2023-06-07 ENCOUNTER — Ambulatory Visit (INDEPENDENT_AMBULATORY_CARE_PROVIDER_SITE_OTHER): Payer: Medicare Other | Admitting: Internal Medicine

## 2023-06-07 ENCOUNTER — Encounter: Payer: Self-pay | Admitting: Internal Medicine

## 2023-06-07 VITALS — BP 126/80 | HR 115 | Temp 98.0°F | Ht 62.5 in | Wt 178.0 lb

## 2023-06-07 DIAGNOSIS — J440 Chronic obstructive pulmonary disease with acute lower respiratory infection: Secondary | ICD-10-CM

## 2023-06-07 DIAGNOSIS — J209 Acute bronchitis, unspecified: Secondary | ICD-10-CM

## 2023-06-07 DIAGNOSIS — J309 Allergic rhinitis, unspecified: Secondary | ICD-10-CM

## 2023-06-07 DIAGNOSIS — F17200 Nicotine dependence, unspecified, uncomplicated: Secondary | ICD-10-CM

## 2023-06-07 DIAGNOSIS — E7800 Pure hypercholesterolemia, unspecified: Secondary | ICD-10-CM

## 2023-06-07 DIAGNOSIS — E1165 Type 2 diabetes mellitus with hyperglycemia: Secondary | ICD-10-CM | POA: Diagnosis not present

## 2023-06-07 DIAGNOSIS — E559 Vitamin D deficiency, unspecified: Secondary | ICD-10-CM | POA: Diagnosis not present

## 2023-06-07 DIAGNOSIS — Z7984 Long term (current) use of oral hypoglycemic drugs: Secondary | ICD-10-CM

## 2023-06-07 DIAGNOSIS — E538 Deficiency of other specified B group vitamins: Secondary | ICD-10-CM | POA: Diagnosis not present

## 2023-06-07 MED ORDER — SIMVASTATIN 20 MG PO TABS
20.0000 mg | ORAL_TABLET | Freq: Every day | ORAL | 3 refills | Status: AC
Start: 2023-06-07 — End: ?

## 2023-06-07 MED ORDER — AMITRIPTYLINE HCL 50 MG PO TABS: 50.0000 mg | ORAL_TABLET | Freq: Every day | ORAL | 1 refills | Status: DC

## 2023-06-07 MED ORDER — METFORMIN HCL ER 500 MG PO TB24: ORAL_TABLET | ORAL | 3 refills | Status: AC

## 2023-06-07 NOTE — Progress Notes (Signed)
 Patient ID: Brittany Oconnor, female   DOB: [birth year], 78 y.o.   MRN: [id number]         Chief Complaint:: yearly exam       HPI:  Brittany Oconnor is a 78 y.o. female here overall doing ok.  Does have several wks ongoing nasal allergy symptoms with clearish congestion, itch and sneezing, without fever, pain, ST, cough, swelling or wheezing.  Pt denies chest pain, increased sob or doe, wheezing, orthopnea, PND, increased LE swelling, palpitations, dizziness or syncope.   Pt denies polydipsia, polyuria, or new focal neuro s/s.    Still smoking, might be ready to quit soon, but not yet.     Wt Readings from Last 3 Encounters:  06/07/23 178 lb (80.7 kg)  12/24/22 170 lb (77.1 kg)  12/07/22 175 lb (79.4 kg)   BP Readings from Last 3 Encounters:  06/07/23 126/80  12/24/22 124/74  12/07/22 132/84   Immunization History  Administered Date(s) Administered   Fluad Quad(high Dose 65+) 11/16/2018   Influenza Whole 01/27/2010   PFIZER(Purple Top)SARS-COV-2 Vaccination 05/05/2019, 05/26/2019, 12/24/2019, 06/27/2020   Pneumococcal Conjugate-13 05/17/2014   Pneumococcal Polysaccharide-23 07/10/2008, 05/10/2017   Td 07/10/2008   Tdap 11/16/2018   Zoster, Live 07/10/2008   Health Maintenance Due  Topic Date Due   Zoster Vaccines- Shingrix (1 of 2) 05/14/1963   Lung Cancer Screening  12/23/2022   OPHTHALMOLOGY EXAM  01/12/2023      Past Medical History:  Diagnosis Date   ALLERGIC RHINITIS 11/24/2006   Allergy    ANXIETY 07/10/2008   DIABETES MELLITUS, TYPE II 11/24/2006   GERD (gastroesophageal reflux disease)    History of shingles    HYPERLIPIDEMIA 11/24/2006   Past Surgical History:  Procedure Laterality Date   BREAST BIOPSY Right 03/11/2015   benign   EYE SURGERY Bilateral 2022   Cataract surgery   MOUTH SURGERY      reports that she has been smoking cigarettes. She has a 23.5 pack-year smoking history. She has never used smokeless tobacco. She reports that she does not drink alcohol  and does not use drugs. family history includes Appendicitis in her father; Colon cancer (age of onset: 84) in her sister; Colon cancer (age of onset: 39) in her sister; Heart disease in her sister; Hypertension in her father; Stroke in her mother. Allergies  Allergen Reactions   Shellfish Allergy Hives   Doxycycline    Current Outpatient Medications on File Prior to Visit  Medication Sig Dispense Refill   aspirin 81 MG tablet Take 81 mg by mouth daily.     cholecalciferol (VITAMIN D3) 25 MCG (1000 UNIT) tablet Take 50 mcg by mouth daily.     cyanocobalamin (VITAMIN B12) 1000 MCG tablet Take 1,000 mcg by mouth daily.     Lancets MISC Apply 1 Device topically daily. E11.9 (Patient not taking: Reported on 06/07/2023) 100 each 3   No current facility-administered medications on file prior to visit.        ROS:  All others reviewed and negative.  Objective        PE:  BP 126/80 (BP Location: Right Arm, Patient Position: Sitting, Cuff Size: Normal)   Pulse (!) 115   Temp 98 F (36.7 C) (Oral)   Ht 5' 2.5" (1.588 m)   Wt 178 lb (80.7 kg)   SpO2 97%   BMI 32.04 kg/m                 Constitutional: Pt appears in  NAD               HENT: Head: NCAT.                Right Ear: External ear normal.                 Left Ear: External ear normal.                Eyes: . Pupils are equal, round, and reactive to light. Conjunctivae and EOM are normal               Nose: without d/c or deformity               Neck: Neck supple. Gross normal ROM               Cardiovascular: Normal rate and regular rhythm.                 Pulmonary/Chest: Effort normal and breath sounds without rales or wheezing.                Abd:  Soft, NT, ND, + BS, no organomegaly               Neurological: Pt is alert. At baseline orientation, motor grossly intact               Skin: Skin is warm. No rashes, no other new lesions, LE edema - none               Psychiatric: Pt behavior is normal without agitation   Micro:  none  Cardiac tracings I have personally interpreted today:  none  Pertinent Radiological findings (summarize): none   Lab Results  Component Value Date   WBC 7.5 06/03/2023   HGB 14.0 06/03/2023   HCT 43.1 06/03/2023   PLT 243.0 06/03/2023   GLUCOSE 101 (H) 06/03/2023   CHOL 157 06/03/2023   TRIG 125.0 06/03/2023   HDL 62.80 06/03/2023   LDLDIRECT 99.9 03/07/2007   LDLCALC 69 06/03/2023   ALT 8 06/03/2023   AST 14 06/03/2023   NA 141 06/03/2023   K 3.9 06/03/2023   CL 101 06/03/2023   CREATININE 0.91 06/03/2023   BUN 16 06/03/2023   CO2 30 06/03/2023   TSH 1.20 06/03/2023   HGBA1C 6.9 (H) 06/03/2023   MICROALBUR 4.1 (H) 06/03/2023   Assessment/Plan:  Brittany Oconnor is a 78 y.o. Black or African American [2] female with  has a past medical history of ALLERGIC RHINITIS (11/24/2006), Allergy, ANXIETY (07/10/2008), DIABETES MELLITUS, TYPE II (11/24/2006), GERD (gastroesophageal reflux disease), History of shingles, and HYPERLIPIDEMIA (11/24/2006).  COPD (chronic obstructive pulmonary disease) with acute bronchitis (HCC) Stable overall, cont inhaler prn  Allergic rhinitis Mild to mod, for otc allegra and /or nasacort,  to f/u any worsening symptoms or concerns  B12 deficiency Lab Results  Component Value Date   VITAMINB12 1,474 (H) 06/03/2023   Stable, cont oral replacement - b12 1000 mcg qd   Diabetes Lab Results  Component Value Date   HGBA1C 6.9 (H) 06/03/2023   Stable, pt to continue current medical treatment metformin ER 500 mg - 3 qd   Hyperlipidemia Lab Results  Component Value Date   LDLCALC 69 06/03/2023   Stable, pt to continue current statin zocor 20 mg qd   Smoker Pt counsled to quit smoking, pt not quite ready  Vitamin D deficiency Last vitamin D Lab Results  Component Value Date   VD25OH  53.37 06/03/2023   Stable, cont oral replacement  Followup: Return in about 6 months (around 12/08/2023).  Oliver Barre, MD 06/11/2023 2:44 PM [facility] [place] [facility] - [facility]

## 2023-06-07 NOTE — Patient Instructions (Addendum)
 Please quit smoking  Please continue all other medications as before, and refills have been done if requested.  Please have the pharmacy call with any other refills you may need.  Please continue your efforts at being more active, low cholesterol diet, and weight control.  You are otherwise up to date with prevention measures today.  Please keep your appointments with your specialists as you may have planned  Your lab test results were excellent!  Please make an Appointment to return in 6 months, or sooner if needed

## 2023-06-11 ENCOUNTER — Encounter: Payer: Self-pay | Admitting: Internal Medicine

## 2023-06-11 NOTE — Assessment & Plan Note (Signed)
 Pt counsled to quit smoking, pt not quite ready

## 2023-06-11 NOTE — Assessment & Plan Note (Signed)
Stable overall, cont inhaler prn 

## 2023-06-11 NOTE — Assessment & Plan Note (Signed)
 Last vitamin D Lab Results  Component Value Date   VD25OH 53.37 06/03/2023   Stable, cont oral replacement

## 2023-06-11 NOTE — Assessment & Plan Note (Signed)
 Lab Results  Component Value Date   HGBA1C 6.9 (H) 06/03/2023   Stable, pt to continue current medical treatment metformin ER 500 mg - 3 qd

## 2023-06-11 NOTE — Assessment & Plan Note (Signed)
 Lab Results  Component Value Date   VITAMINB12 1,474 (H) 06/03/2023   Stable, cont oral replacement - b12 1000 mcg qd

## 2023-06-11 NOTE — Assessment & Plan Note (Signed)
 Lab Results  Component Value Date   LDLCALC 69 06/03/2023   Stable, pt to continue current statin zocor 20 mg qd

## 2023-06-11 NOTE — Assessment & Plan Note (Signed)
Mild to mod, for otc allegra and/or nasacort,  to f/u any worsening symptoms or concerns

## 2023-08-22 IMAGING — MG MM DIGITAL SCREENING BILAT W/ TOMO AND CAD
8 series · 8 of 24 positions shown · non-contrast
Comparison: Previous exam(s).

CLINICAL DATA: Screening.

EXAM:
DIGITAL SCREENING BILATERAL MAMMOGRAM WITH TOMOSYNTHESIS AND CAD
TECHNIQUE: Bilateral screening digital craniocaudal and mediolateral oblique
mammograms were obtained. Bilateral screening digital breast
tomosynthesis was performed. The images were evaluated with
computer-aided detection.

[R CC synth-2D]
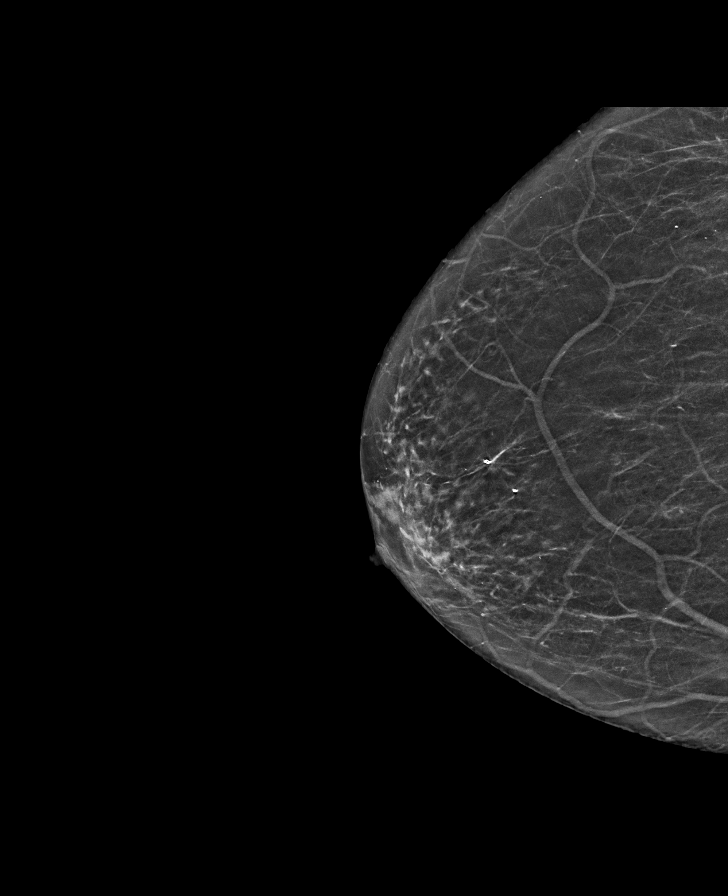

[L MLO synth-2D]
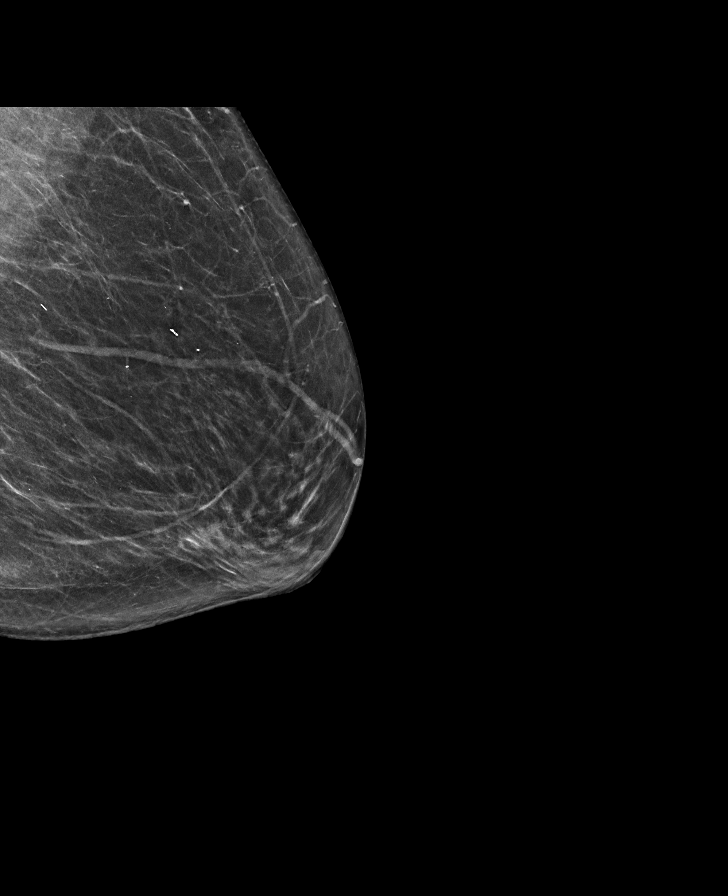

[L CC synth-2D]
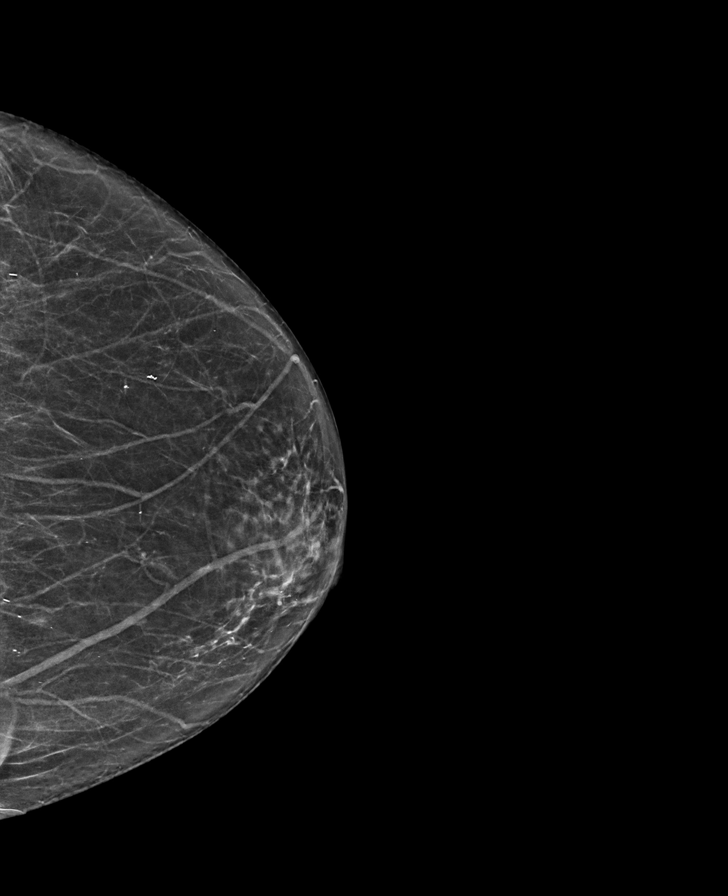

[R MLO synth-2D]
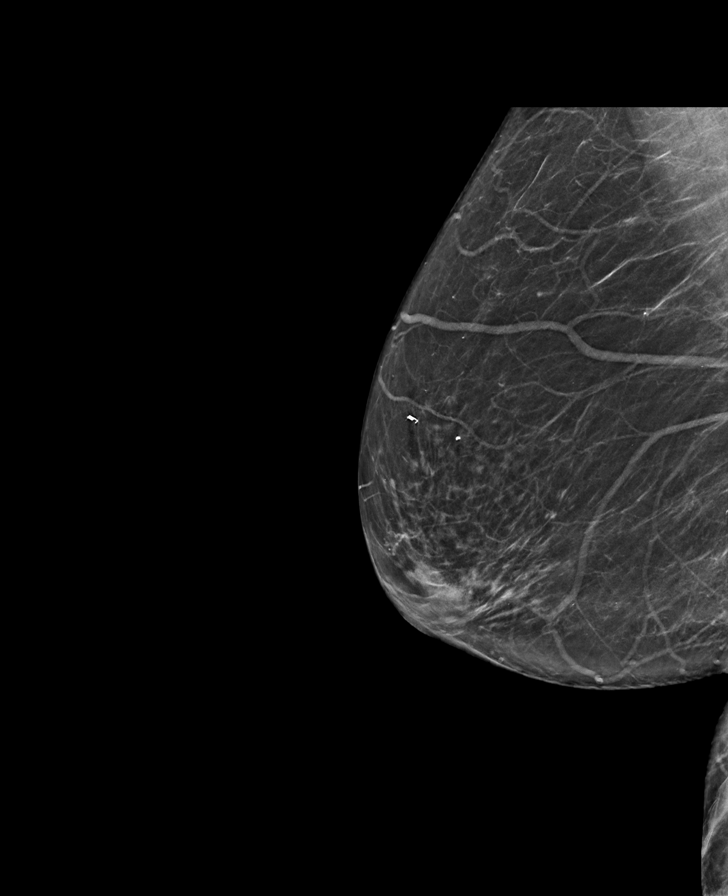

[L CC tomo · tomo slice 32/63.0]
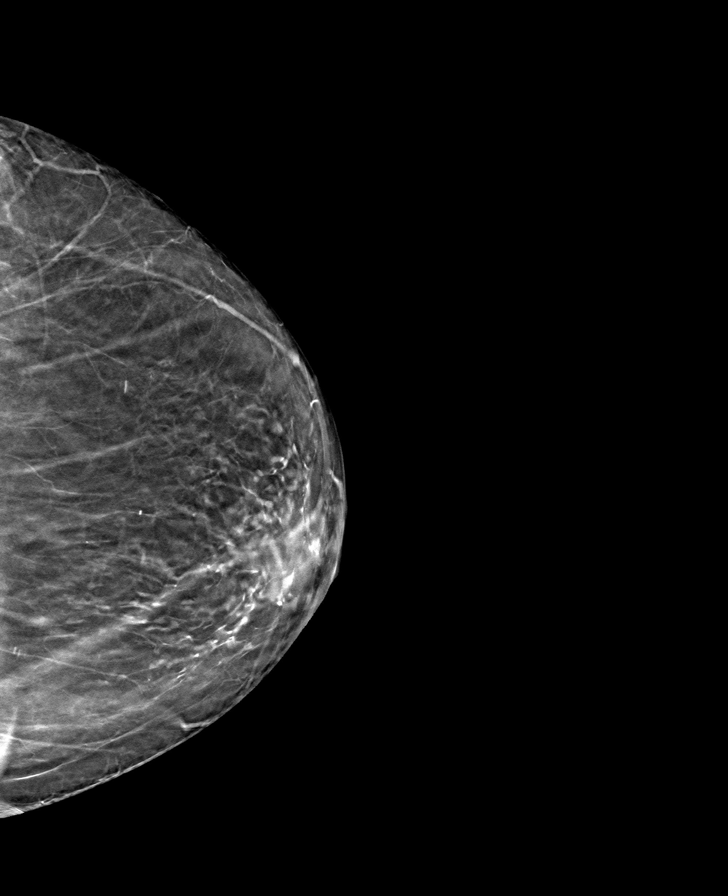

[R MLO tomo · tomo slice 35/68.0]
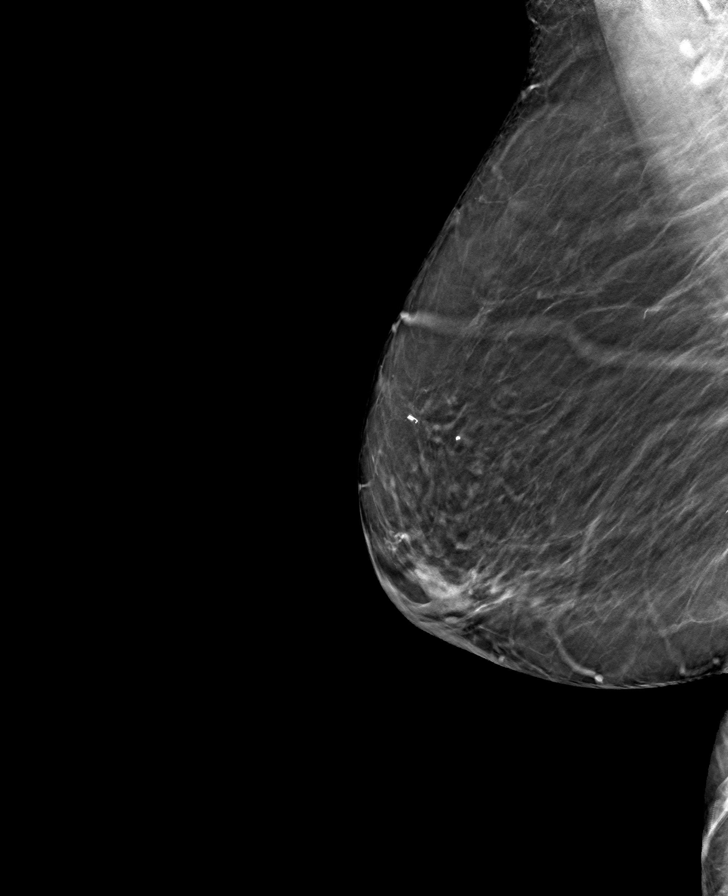

[L MLO tomo · tomo slice 34/67.0]
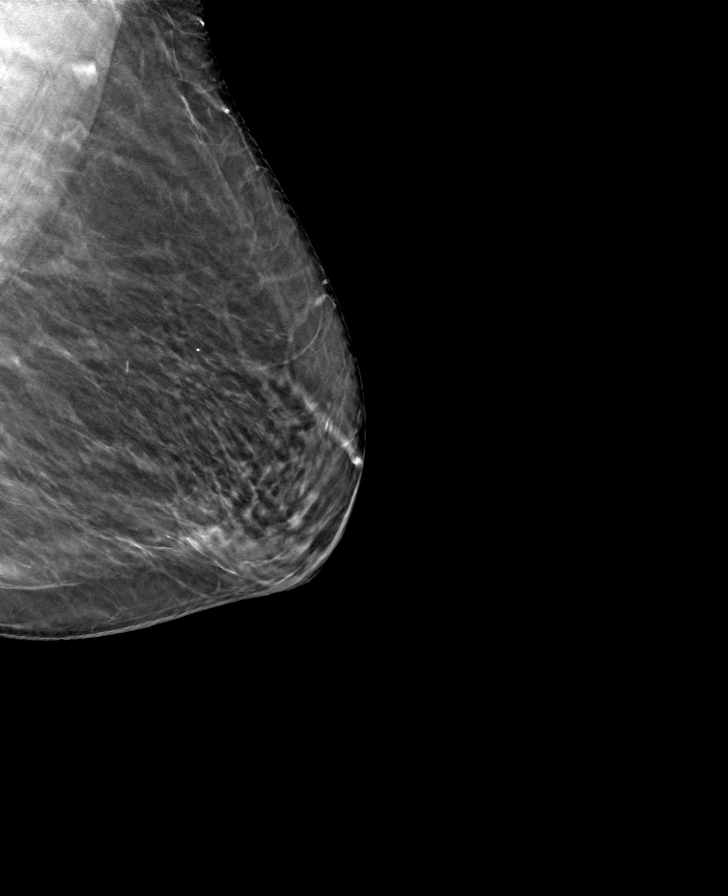

[R CC tomo · tomo slice 29/57.0]
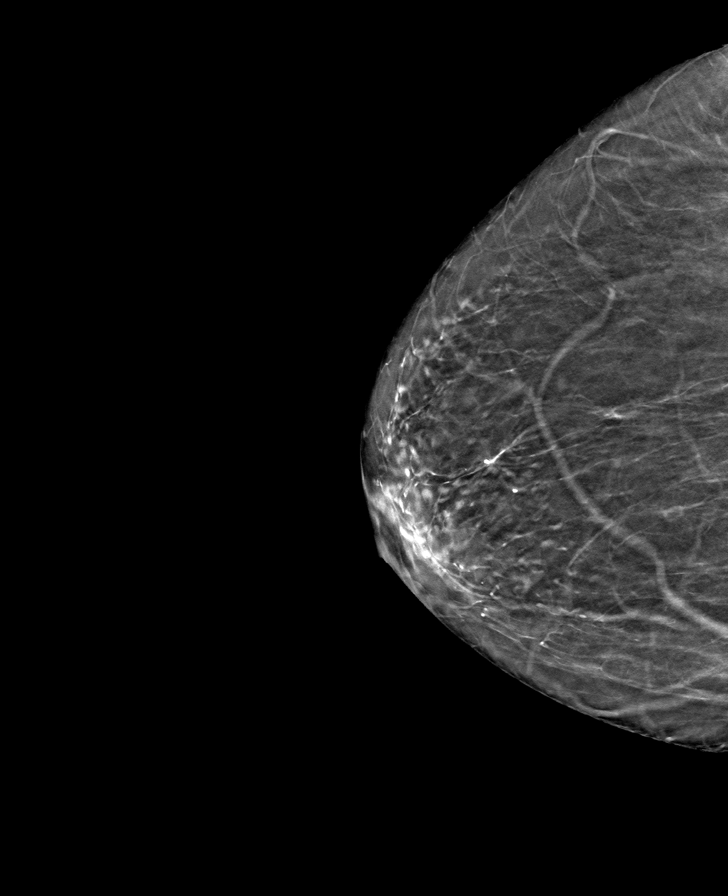

[8 of 24 positions shown; findings below may reference images not displayed]

ACR Breast Density Category b: There are scattered areas of
fibroglandular density.
FINDINGS: There are no findings suspicious for malignancy.
IMPRESSION: No mammographic evidence of malignancy. A result letter of this
screening mammogram will be mailed directly to the patient.

RECOMMENDATION:
Screening mammogram in one year. (Code:51-O-LD2)

BI-RADS CATEGORY  1: Negative.

## 2023-10-12 ENCOUNTER — Telehealth: Payer: Self-pay | Admitting: Internal Medicine

## 2023-10-12 NOTE — Telephone Encounter (Signed)
 Copied from CRM [id number]. Topic: Clinical - Medical Advice >> Oct 12, 2023 11:00 AM Shereese L wrote: Reason for CRM: patient is request for an appt for a lung screening and needs a call back

## 2023-10-18 NOTE — Telephone Encounter (Signed)
 The Low Dose CT scan for lung cancer screening is not able to done due to pt is over 78yo.  sorry

## 2023-10-19 NOTE — Telephone Encounter (Signed)
 Called and informed patient of PCP response. Patient expressed understanding and stated that no new symptoms had emerged but she was concerned as she was aging that she would become more susceptible to these issues. Informed her if anything was to come up by the time she followed up in September we can reassess

## 2023-11-03 ENCOUNTER — Ambulatory Visit: Payer: Medicare PPO

## 2023-11-03 VITALS — BP 130/80 | HR 105 | Ht 62.5 in | Wt 175.2 lb

## 2023-11-03 DIAGNOSIS — Z0000 Encounter for general adult medical examination without abnormal findings: Secondary | ICD-10-CM

## 2023-11-03 DIAGNOSIS — F17210 Nicotine dependence, cigarettes, uncomplicated: Secondary | ICD-10-CM | POA: Diagnosis not present

## 2023-11-03 DIAGNOSIS — Z122 Encounter for screening for malignant neoplasm of respiratory organs: Secondary | ICD-10-CM

## 2023-11-03 NOTE — Progress Notes (Signed)
 Subjective:   Brittany Oconnor is a 78 y.o. who presents for a Medicare Wellness preventive visit.  As a reminder, Annual Wellness Visits don't include a physical exam, and some assessments may be limited, especially if this visit is performed virtually. We may recommend an in-person follow-up visit with your provider if needed.  Visit Complete: In person  Persons Participating in Visit: Patient.  AWV Questionnaire: No: Patient Medicare AWV questionnaire was not completed prior to this visit.  Cardiac Risk Factors include: advanced age (>33men, >52 women);diabetes mellitus;dyslipidemia;smoking/ tobacco exposure;Other (see comment), Risk factor comments: COPD     Objective:    There were no vitals filed for this visit. There is no height or weight on file to calculate BMI.     11/03/2023    2:52 PM 11/01/2022    2:48 PM 11/16/2021    1:37 PM 11/13/2020   12:38 PM 05/28/2015    9:57 AM  Advanced Directives  Does Patient Have a Medical Advance Directive? No Yes No No No   Type of Special educational needs teacher of [place];Living will     Copy of Healthcare Power of Attorney in Chart?  No - copy requested     Would patient like information on creating a medical advance directive?   No - Patient declined No - Patient declined      Data saved with a previous flowsheet row definition    Current Medications (verified) Outpatient Encounter Medications as of 11/03/2023  Medication Sig   amitriptyline  (ELAVIL ) 50 MG tablet Take 1 tablet (50 mg total) by mouth at bedtime.   aspirin  81 MG tablet Take 81 mg by mouth daily.   cholecalciferol (VITAMIN D3) 25 MCG (1000 UNIT) tablet Take 50 mcg by mouth daily.   cyanocobalamin  (VITAMIN B12) 1000 MCG tablet Take 1,000 mcg by mouth daily.   Lancets MISC Apply 1 Device topically daily. E11.9   metFORMIN  (GLUCOPHAGE -XR) 500 MG 24 hr tablet TAKE 3 TABLETS BY MOUTH EVERY MORNING   simvastatin  (ZOCOR ) 20 MG tablet Take 1 tablet (20 mg total) by mouth  daily.   No facility-administered encounter medications on file as of 11/03/2023.    Allergies (verified) Shellfish allergy and Doxycycline   History: Past Medical History:  Diagnosis Date   ALLERGIC RHINITIS 11/24/2006   Allergy    ANXIETY 07/10/2008   DIABETES MELLITUS, TYPE II 11/24/2006   GERD (gastroesophageal reflux disease)    History of shingles    HYPERLIPIDEMIA 11/24/2006   Past Surgical History:  Procedure Laterality Date   BREAST BIOPSY Right 03/11/2015   benign   EYE SURGERY Bilateral 2022   Cataract surgery   MOUTH SURGERY     Family History  Problem Relation Age of Onset   Colon cancer Sister 35       deceased    Heart disease Sister        died of heart attack   Colon cancer Sister 54       alive   Hypertension Father    Appendicitis Father        rupture   Stroke Mother        stroke 59;died age 33   Social History   Socioeconomic History   Marital status: Married    Spouse name: Bruce   Number of children: 1   Years of education: Not on file   Highest education level: Not on file  Occupational History   Not on file  Tobacco Use   Smoking  status: Every Day    Current packs/day: 0.50    Average packs/day: 0.5 packs/day for 47.0 years (79.0 ttl pk-yrs)    Types: Cigarettes   Smokeless tobacco: Never   Tobacco comments:    0.5 ppd  Vaping Use   Vaping status: Never Used  Substance and Sexual Activity   Alcohol use: No    Alcohol/week: 0.0 standard drinks of alcohol   Drug use: No   Sexual activity: Not on file  Other Topics Concern   Not on file  Social History Narrative   Lives with husband/2025   Social Drivers of Health   Financial Resource Strain: Low Risk  (11/03/2023)   Overall Financial Resource Strain (CARDIA)    Difficulty of Paying Living Expenses: Not hard at all  Food Insecurity: No Food Insecurity (11/03/2023)   Hunger Vital Sign    Worried About Running Out of Food in the Last Year: Never true    Ran Out of Food in the  Last Year: Never true  Transportation Needs: No Transportation Needs (11/03/2023)   PRAPARE - Administrator, Civil Service (Medical): No    Lack of Transportation (Non-Medical): No  Physical Activity: Patient Declined (11/03/2023)   Exercise Vital Sign    Days of Exercise per Week: Patient declined    Minutes of Exercise per Session: Patient declined  Stress: No Stress Concern Present (11/03/2023)   [employer] of Occupational Health - Occupational Stress Questionnaire    Feeling of Stress: Not at all  Social Connections: Moderately Isolated (11/03/2023)   Social Connection and Isolation Panel    Frequency of Communication with Friends and Family: More than three times a week    Frequency of Social Gatherings with Friends and Family: Once a week    Attends Religious Services: Never    Database administrator or Organizations: No    Attends Engineer, structural: Never    Marital Status: Married    Tobacco Counseling Ready to quit: Not Answered Counseling given: Not Answered Tobacco comments: 0.5 ppd    Clinical Intake:  Pre-visit preparation completed: Yes  Pain : No/denies pain (tingling in toes and feet)     Nutritional Risks: None Diabetes: Yes CBG done?: No Did pt. bring in CBG monitor from home?: No  Lab Results  Component Value Date   HGBA1C 6.9 (H) 06/03/2023   HGBA1C 6.7 (H) 12/02/2022   HGBA1C 6.6 (H) 05/26/2022     How often do you need to have someone help you when you read instructions, pamphlets, or other written materials from your doctor or pharmacy?: 1 - Never  Interpreter Needed?: No  Information entered by :: Tammra Pressman, RMA   Activities of Daily Living     11/03/2023    2:30 PM  In your present state of health, do you have any difficulty performing the following activities:  Hearing? 0  Vision? 0  Difficulty concentrating or making decisions? 0  Walking or climbing stairs? 0  Dressing or bathing? 0  Doing errands,  shopping? 0  Preparing Food and eating ? N  Using the Toilet? N  In the past six months, have you accidently leaked urine? N  Do you have problems with loss of bowel control? N  Managing your Medications? N  Managing your Finances? N  Housekeeping or managing your Housekeeping? N    Patient Care Team: Norleen Lynwood ORN, MD as PCP - General  I have updated your Care Teams any recent Medical  Services you may have received from other providers in the past year.     Assessment:   This is a routine wellness examination for Emmali.  Hearing/Vision screen Hearing Screening - Comments:: Denies hearing difficulties   Vision Screening - Comments:: Wears eyeglasses/Dr. Octavia   Goals Addressed   None    Depression Screen     11/03/2023    2:55 PM 06/07/2023    2:11 PM 12/24/2022    3:20 PM 12/07/2022    1:55 PM 11/01/2022    2:57 PM 05/27/2022    2:16 PM 12/04/2021    2:41 PM  PHQ 2/9 Scores  PHQ - 2 Score 0 0 0 0 0 0   PHQ- 9 Score 2  0 0 0 0   Exception Documentation       Patient refusal    Fall Risk     11/03/2023    2:52 PM 06/07/2023    2:15 PM 12/24/2022    3:20 PM 12/07/2022    1:55 PM 11/01/2022    2:49 PM  Fall Risk   Falls in the past year? 0 0 0 0 1  Number falls in past yr: 0 0 0 0 0  Injury with Fall? 0 0 0 0 1  Comment     Urgent care/chipped bone  Risk for fall due to :  No Fall Risks No Fall Risks No Fall Risks   Follow up Falls evaluation completed;Falls prevention discussed Falls evaluation completed Falls evaluation completed Falls evaluation completed     MEDICARE RISK AT HOME:  Medicare Risk at Home Any stairs in or around the home?: Yes If so, are there any without handrails?: No Home free of loose throw rugs in walkways, pet beds, electrical cords, etc?: Yes Adequate lighting in your home to reduce risk of falls?: Yes Life alert?: No Use of a cane, walker or w/c?: No Grab bars in the bathroom?: No Shower chair or bench in shower?: Yes Elevated toilet seat  or a handicapped toilet?: Yes  TIMED UP AND GO:  Was the test performed?  Yes  Length of time to ambulate 10 feet: 15 sec Gait steady and fast without use of assistive device  Cognitive Function: Declined/Normal: No cognitive concerns noted by patient or family. Patient alert, oriented, able to answer questions appropriately and recall recent events. No signs of memory loss or confusion.        11/01/2022    2:51 PM 11/16/2021    1:50 PM  6CIT Screen  What Year? 0 points 0 points  What month? 0 points 0 points  What time? 0 points 0 points  Count back from 20 0 points 0 points  Months in reverse 0 points 0 points  Repeat phrase 0 points 0 points  Total Score 0 points 0 points    Immunizations Immunization History  Administered Date(s) Administered   Fluad Quad(high Dose 65+) 11/16/2018   Influenza Whole 01/27/2010   PFIZER(Purple Top)SARS-COV-2 Vaccination 05/05/2019, 05/26/2019, 12/24/2019, 06/27/2020   Pneumococcal Conjugate-13 05/17/2014   Pneumococcal Polysaccharide-23 07/10/2008, 05/10/2017   Td 07/10/2008   Tdap 11/16/2018   Zoster, Live 07/10/2008    Screening Tests Health Maintenance  Topic Date Due   Zoster Vaccines- Shingrix (1 of 2) 05/14/1963   Lung Cancer Screening  12/23/2022   OPHTHALMOLOGY EXAM  01/12/2023   INFLUENZA VACCINE  10/28/2023   HEMOGLOBIN A1C  12/04/2023   Diabetic kidney evaluation - eGFR measurement  06/02/2024   Diabetic kidney evaluation - Urine  ACR  06/02/2024   FOOT EXAM  06/06/2024   Medicare Annual Wellness (AWV)  11/02/2024   DTaP/Tdap/Td (3 - Td or Tdap) 11/15/2028   Pneumococcal Vaccine: 50+ Years  Completed   DEXA SCAN  Completed   Hepatitis C Screening  Completed   Hepatitis B Vaccines  Aged Out   HPV VACCINES  Aged Out   Meningococcal B Vaccine  Aged Out   Colonoscopy  Discontinued   COVID-19 Vaccine  Discontinued    Health Maintenance  Health Maintenance Due  Topic Date Due   Zoster Vaccines- Shingrix (1 of 2)  05/14/1963   Lung Cancer Screening  12/23/2022   OPHTHALMOLOGY EXAM  01/12/2023   INFLUENZA VACCINE  10/28/2023   Health Maintenance Items Addressed: See Nurse Notes at the end of this note  Additional Screening:  Vision Screening: Recommended annual ophthalmology exams for early detection of glaucoma and other disorders of the eye. Would you like a referral to an eye doctor? No    Dental Screening: Recommended annual dental exams for proper oral hygiene  Community Resource Referral / Chronic Care Management: CRR required this visit?  No   CCM required this visit?  No   Plan:    I have personally reviewed and noted the following in the patient's chart:   Medical and social history Use of alcohol, tobacco or illicit drugs  Current medications and supplements including opioid prescriptions. Patient is not currently taking opioid prescriptions. Functional ability and status Nutritional status Physical activity Advanced directives List of other physicians Hospitalizations, surgeries, and ER visits in previous 12 months Vitals Screenings to include cognitive, depression, and falls Referrals and appointments  In addition, I have reviewed and discussed with patient certain preventive protocols, quality metrics, and best practice recommendations. A written personalized care plan for preventive services as well as general preventive health recommendations were provided to patient.   Rylah Fukuda L Juanita Devincent, CMA   11/03/2023   After Visit Summary: (MyChart) Due to this being a telephonic visit, the after visit summary with patients personalized plan was offered to patient via MyChart   Notes: Patient is due for a shingles vaccine.  She is request a neuropathy screening due to feet tingling and also her feet feeling like something is squeezing them at times.  Patient is scheduled for an eye exam on 03/12/2024.  She is due for a lung cancer screening and order has been placed today.

## 2023-11-03 NOTE — Patient Instructions (Signed)
 Brittany Oconnor , Thank you for taking time out of your busy schedule to complete your Annual Wellness Visit with me. I enjoyed our conversation and look forward to speaking with you again next year. I, as well as your care team,  appreciate your ongoing commitment to your health goals. Please review the following plan we discussed and let me know if I can assist you in the future. Your Game plan/ To Do List    Referrals: If you haven't heard from the office you've been referred to, please reach out to them at the phone provided.   Follow up Visits: We will see or speak with you next year for your Next Medicare AWV with our clinical staff Have you seen your provider in the last 6 months (3 months if uncontrolled diabetes)? Yes  Clinician Recommendations:  Aim for 30 minutes of exercise or brisk walking, 6-8 glasses of water, and 5 servings of fruits and vegetables each day. Remember to ask about Silver sneakers through insurance plan.        This is a list of the screenings recommended for you:  Health Maintenance  Topic Date Due   Zoster (Shingles) Vaccine (1 of 2) 05/14/1963   Screening for Lung Cancer  12/23/2022   Eye exam for diabetics  01/12/2023   Flu Shot  10/28/2023   Medicare Annual Wellness Visit  12/02/2023   Hemoglobin A1C  12/04/2023   Yearly kidney function blood test for diabetes  06/02/2024   Yearly kidney health urinalysis for diabetes  06/02/2024   Complete foot exam   06/06/2024   DTaP/Tdap/Td vaccine (3 - Td or Tdap) 11/15/2028   Pneumococcal Vaccine for age over 76  Completed   DEXA scan (bone density measurement)  Completed   Hepatitis C Screening  Completed   Hepatitis B Vaccine  Aged Out   HPV Vaccine  Aged Out   Meningitis B Vaccine  Aged Out   Colon Cancer Screening  Discontinued   COVID-19 Vaccine  Discontinued    Advanced directives: (Declined) Advance directive discussed with you today. Even though you declined this today, please call our office should you  change your mind, and we can give you the proper paperwork for you to fill out. Advance Care Planning is important because it:  [x]  Makes sure you receive the medical care that is consistent with your values, goals, and preferences  [x]  It provides guidance to your family and loved ones and reduces their decisional burden about whether or not they are making the right decisions based on your wishes.  Follow the link provided in your after visit summary or read over the paperwork we have mailed to you to help you started getting your Advance Directives in place. If you need assistance in completing these, please reach out to us  so that we can help you!  See attachments for Preventive Care and Fall Prevention Tips.

## 2023-12-06 ENCOUNTER — Other Ambulatory Visit (INDEPENDENT_AMBULATORY_CARE_PROVIDER_SITE_OTHER)

## 2023-12-06 DIAGNOSIS — E559 Vitamin D deficiency, unspecified: Secondary | ICD-10-CM | POA: Diagnosis not present

## 2023-12-06 DIAGNOSIS — E538 Deficiency of other specified B group vitamins: Secondary | ICD-10-CM

## 2023-12-06 DIAGNOSIS — E119 Type 2 diabetes mellitus without complications: Secondary | ICD-10-CM

## 2023-12-06 LAB — CBC WITH DIFFERENTIAL/PLATELET
Basophils Absolute: 0 K/uL (ref 0.0–0.1)
Basophils Relative: 0.5 % (ref 0.0–3.0)
Eosinophils Absolute: 0.1 K/uL (ref 0.0–0.7)
Eosinophils Relative: 1.4 % (ref 0.0–5.0)
HCT: 43.1 % (ref 36.0–46.0)
Hemoglobin: 14.1 g/dL (ref 12.0–15.0)
Lymphocytes Relative: 45.5 % (ref 12.0–46.0)
Lymphs Abs: 3.1 K/uL (ref 0.7–4.0)
MCHC: 32.6 g/dL (ref 30.0–36.0)
MCV: 83.1 fl (ref 78.0–100.0)
Monocytes Absolute: 0.6 K/uL (ref 0.1–1.0)
Monocytes Relative: 8.9 % (ref 3.0–12.0)
Neutro Abs: 3 K/uL (ref 1.4–7.7)
Neutrophils Relative %: 43.7 % (ref 43.0–77.0)
Platelets: 240 K/uL (ref 150.0–400.0)
RBC: 5.19 Mil/uL — ABNORMAL HIGH (ref 3.87–5.11)
RDW: 14.1 % (ref 11.5–15.5)
WBC: 6.8 K/uL (ref 4.0–10.5)

## 2023-12-06 LAB — URINALYSIS, ROUTINE W REFLEX MICROSCOPIC
Bilirubin Urine: NEGATIVE
Hgb urine dipstick: NEGATIVE
Ketones, ur: NEGATIVE
Leukocytes,Ua: NEGATIVE
Nitrite: NEGATIVE
Specific Gravity, Urine: 1.01 (ref 1.000–1.030)
Urine Glucose: NEGATIVE
Urobilinogen, UA: 0.2 (ref 0.0–1.0)
pH: 6.5 (ref 5.0–8.0)

## 2023-12-06 LAB — HEMOGLOBIN A1C: Hgb A1c MFr Bld: 6.9 % — ABNORMAL HIGH (ref 4.6–6.5)

## 2023-12-06 LAB — TSH: TSH: 1.01 u[IU]/mL (ref 0.35–5.50)

## 2023-12-06 LAB — HEPATIC FUNCTION PANEL
ALT: 9 U/L (ref 0–35)
AST: 15 U/L (ref 0–37)
Albumin: 4.2 g/dL (ref 3.5–5.2)
Alkaline Phosphatase: 58 U/L (ref 39–117)
Bilirubin, Direct: 0.1 mg/dL (ref 0.0–0.3)
Total Bilirubin: 0.5 mg/dL (ref 0.2–1.2)
Total Protein: 7 g/dL (ref 6.0–8.3)

## 2023-12-06 LAB — MICROALBUMIN / CREATININE URINE RATIO
Creatinine,U: 74.5 mg/dL
Microalb Creat Ratio: 110.7 mg/g — ABNORMAL HIGH (ref 0.0–30.0)
Microalb, Ur: 8.3 mg/dL — ABNORMAL HIGH (ref 0.0–1.9)

## 2023-12-06 LAB — BASIC METABOLIC PANEL WITH GFR
BUN: 15 mg/dL (ref 6–23)
CO2: 31 meq/L (ref 19–32)
Calcium: 9.9 mg/dL (ref 8.4–10.5)
Chloride: 99 meq/L (ref 96–112)
Creatinine, Ser: 0.95 mg/dL (ref 0.40–1.20)
GFR: 56.96 mL/min — ABNORMAL LOW (ref >60.00)
Glucose, Bld: 94 mg/dL (ref 70–99)
Potassium: 4 meq/L (ref 3.5–5.1)
Sodium: 139 meq/L (ref 135–145)

## 2023-12-06 LAB — VITAMIN D 25 HYDROXY (VIT D DEFICIENCY, FRACTURES): VITD: 59.61 ng/mL (ref 30.00–100.00)

## 2023-12-06 LAB — LIPID PANEL
Cholesterol: 143 mg/dL (ref 0–200)
HDL: 70.4 mg/dL (ref >39.00)
LDL Cholesterol: 51 mg/dL (ref 0–99)
NonHDL: 72.98
Total CHOL/HDL Ratio: 2
Triglycerides: 108 mg/dL (ref 0.0–149.0)
VLDL: 21.6 mg/dL (ref 0.0–40.0)

## 2023-12-06 LAB — VITAMIN B12: Vitamin B-12: 1245 pg/mL — ABNORMAL HIGH (ref 211–911)

## 2023-12-08 ENCOUNTER — Ambulatory Visit: Admitting: Internal Medicine

## 2023-12-08 ENCOUNTER — Encounter: Payer: Self-pay | Admitting: Internal Medicine

## 2023-12-08 VITALS — BP 158/82 | HR 126 | Temp 97.9°F | Ht 62.5 in | Wt 175.0 lb

## 2023-12-08 DIAGNOSIS — E1165 Type 2 diabetes mellitus with hyperglycemia: Secondary | ICD-10-CM | POA: Diagnosis not present

## 2023-12-08 DIAGNOSIS — R222 Localized swelling, mass and lump, trunk: Secondary | ICD-10-CM | POA: Diagnosis not present

## 2023-12-08 DIAGNOSIS — E7800 Pure hypercholesterolemia, unspecified: Secondary | ICD-10-CM | POA: Diagnosis not present

## 2023-12-08 DIAGNOSIS — R000 Tachycardia, unspecified: Secondary | ICD-10-CM

## 2023-12-08 DIAGNOSIS — R0600 Dyspnea, unspecified: Secondary | ICD-10-CM

## 2023-12-08 DIAGNOSIS — R030 Elevated blood-pressure reading, without diagnosis of hypertension: Secondary | ICD-10-CM | POA: Diagnosis not present

## 2023-12-08 DIAGNOSIS — E559 Vitamin D deficiency, unspecified: Secondary | ICD-10-CM

## 2023-12-08 DIAGNOSIS — J440 Chronic obstructive pulmonary disease with acute lower respiratory infection: Secondary | ICD-10-CM

## 2023-12-08 DIAGNOSIS — J209 Acute bronchitis, unspecified: Secondary | ICD-10-CM

## 2023-12-08 DIAGNOSIS — F17200 Nicotine dependence, unspecified, uncomplicated: Secondary | ICD-10-CM

## 2023-12-08 NOTE — Assessment & Plan Note (Signed)
 C/w benign subcutaneous nodule,  to f/u any worsening symptoms or concern, and to general surgury for any enlargement

## 2023-12-08 NOTE — Assessment & Plan Note (Signed)
 Lab Results  Component Value Date   LDLCALC 51 12/06/2023   Stable, pt to continue current statin zocor  20 mg qd

## 2023-12-08 NOTE — Assessment & Plan Note (Signed)
Pt counseld to quit, pt not ready ?

## 2023-12-08 NOTE — Progress Notes (Signed)
 Patient ID: Brittany Oconnor, female   DOB: 1945/12/05, 78 y.o.   MRN: [id number]        Chief Complaint: follow up dm, elevated BP without htn, hld, smoker, low vit d, and tachycardia, copd       HPI:  Brittany Oconnor is a 78 y.o. female here overall doing ok but presents with tachycardia of which she is unaware,  Pt denies chest pain, increased sob or doe, wheezing, orthopnea, PND, increased LE swelling, palpitations, dizziness or syncope.   Pt denies polydipsia, polyuria, or new focal neuro s/s.    Pt denies fever, wt loss, night sweats, loss of appetite, or other constitutional symptoms  Pt states she did rush to get here, no hx of afib or other heart disease.  Also incidentally has a right lateral chest subcutaneous small mass mobile without overlying skin change but new to her x months.  Has eye exam for Dec 15 with Dr Octavia.  Pt still smoking, not ready to quit. She laments she has aged out of LDCT screening but plans to see how she can do it and pay herself.   Wt Readings from Last 3 Encounters:  12/08/23 175 lb (79.4 kg)  11/03/23 175 lb 3.2 oz (79.5 kg)  06/07/23 178 lb (80.7 kg)   BP Readings from Last 3 Encounters:  12/08/23 (!) 158/82  11/03/23 130/80  06/07/23 126/80         Past Medical History:  Diagnosis Date   ALLERGIC RHINITIS 11/24/2006   Allergy    ANXIETY 07/10/2008   DIABETES MELLITUS, TYPE II 11/24/2006   GERD (gastroesophageal reflux disease)    History of shingles    HYPERLIPIDEMIA 11/24/2006   Past Surgical History:  Procedure Laterality Date   BREAST BIOPSY Right 03/11/2015   benign   EYE SURGERY Bilateral 2022   Cataract surgery   MOUTH SURGERY      reports that she has been smoking cigarettes. She has a 23.5 pack-year smoking history. She has never used smokeless tobacco. She reports that she does not drink alcohol and does not use drugs. family history includes Appendicitis in her father; Colon cancer (age of onset: 36) in her sister; Colon cancer (age of  onset: 12) in her sister; Heart disease in her sister; Hypertension in her father; Stroke in her mother. Allergies  Allergen Reactions   Shellfish Allergy Hives   Doxycycline    Current Outpatient Medications on File Prior to Visit  Medication Sig Dispense Refill   amitriptyline  (ELAVIL ) 50 MG tablet Take 1 tablet (50 mg total) by mouth at bedtime. 90 tablet 1   aspirin  81 MG tablet Take 81 mg by mouth daily.     cholecalciferol (VITAMIN D3) 25 MCG (1000 UNIT) tablet Take 50 mcg by mouth daily.     cyanocobalamin  (VITAMIN B12) 1000 MCG tablet Take 1,000 mcg by mouth daily.     Lancets MISC Apply 1 Device topically daily. E11.9 100 each 3   metFORMIN  (GLUCOPHAGE -XR) 500 MG 24 hr tablet TAKE 3 TABLETS BY MOUTH EVERY MORNING 270 tablet 3   simvastatin  (ZOCOR ) 20 MG tablet Take 1 tablet (20 mg total) by mouth daily. 90 tablet 3   No current facility-administered medications on file prior to visit.        ROS:  All others reviewed and negative.  Objective        PE:  BP (!) 158/82   Pulse (!) 126   Temp 97.9 F (36.6 C)  Ht 5' 2.5 (1.588 m)   Wt 175 lb (79.4 kg)   SpO2 97%   BMI 31.50 kg/m                 Constitutional: Pt appears in NAD               HENT: Head: NCAT.                Right Ear: External ear normal.                 Left Ear: External ear normal.                Eyes: . Pupils are equal, round, and reactive to light. Conjunctivae and EOM are normal               Nose: without d/c or deformity               Neck: Neck supple. Gross normal ROM               Cardiovascular: Normal rate and regular rhythm.                 Pulmonary/Chest: Effort normal and breath sounds without rales or wheezing.                Abd:  Soft, NT, ND, + BS, no organomegaly               Neurological: Pt is alert. At baseline orientation, motor grossly intact               Skin: Skin is warm. No rashes, LE edema - trace ankle, right lateral chest approx t4 at the right anterior axillary  line with subcutaneous nodule nontender firm mobile mass, without overlying skin change, this is not in tail of breast                 Psychiatric: Pt behavior is normal without agitation   Micro: none  Cardiac tracings I have personally interpreted today:  ECG - sinus tachycardia 112 with PVc  Pertinent Radiological findings (summarize): none   Lab Results  Component Value Date   WBC 6.8 12/06/2023   HGB 14.1 12/06/2023   HCT 43.1 12/06/2023   PLT 240.0 12/06/2023   GLUCOSE 94 12/06/2023   CHOL 143 12/06/2023   TRIG 108.0 12/06/2023   HDL 70.40 12/06/2023   LDLDIRECT 99.9 03/07/2007   LDLCALC 51 12/06/2023   ALT 9 12/06/2023   AST 15 12/06/2023   NA 139 12/06/2023   K 4.0 12/06/2023   CL 99 12/06/2023   CREATININE 0.95 12/06/2023   BUN 15 12/06/2023   CO2 31 12/06/2023   TSH 1.01 12/06/2023   HGBA1C 6.9 (H) 12/06/2023   MICROALBUR 8.3 (H) 12/06/2023   Assessment/Plan:  Brittany Oconnor is a 78 y.o. Black or African American [2] female with  has a past medical history of ALLERGIC RHINITIS (11/24/2006), Allergy, ANXIETY (07/10/2008), DIABETES MELLITUS, TYPE II (11/24/2006), GERD (gastroesophageal reflux disease), History of shingles, and HYPERLIPIDEMIA (11/24/2006).  Tachycardia Pt without symptoms, ECG with inappropriate Sinus Tach with PVC - for echo cardiac monitor, declines further tx for now  Diabetes Lab Results  Component Value Date   HGBA1C 6.9 (H) 12/06/2023   Stable, pt to continue current medical treatment metformin  ER 500 mg - 3 every day,    ELEVATED BLOOD PRESSURE WITHOUT DIAGNOSIS OF HYPERTENSION BP Readings from Last 3 Encounters:  12/08/23 ROLLEN)  158/82  11/03/23 130/80  06/07/23 126/80   Uncontrolled but pt states controlled at home, pt for diet, wt control, low salt   Hyperlipidemia Lab Results  Component Value Date   LDLCALC 51 12/06/2023   Stable, pt to continue current statin zocor  20 mg qd   Smoker Pt counseld to quit, pt not  ready  Vitamin D  deficiency Last vitamin D  Lab Results  Component Value Date   VD25OH 59.61 12/06/2023   Stable, cont oral replacement   Nodule of chest wall C/w benign subcutaneous nodule,  to f/u any worsening symptoms or concern, and to general surgury for any enlargement  COPD (chronic obstructive pulmonary disease) with acute bronchitis (HCC) stable, cont inhaler prn  Followup: Return in about 6 months (around 06/06/2024).  Lynwood Rush, MD 12/08/2023 8:34 PM  [facility]

## 2023-12-08 NOTE — Assessment & Plan Note (Signed)
 Last vitamin D  Lab Results  Component Value Date   VD25OH 59.61 12/06/2023   Stable, cont oral replacement

## 2023-12-08 NOTE — Assessment & Plan Note (Signed)
 Pt without symptoms, ECG with inappropriate Sinus Tach with PVC - for echo cardiac monitor, declines further tx for now

## 2023-12-08 NOTE — Assessment & Plan Note (Signed)
 Lab Results  Component Value Date   HGBA1C 6.9 (H) 12/06/2023   Stable, pt to continue current medical treatment metformin  ER 500 mg - 3 every day,

## 2023-12-08 NOTE — Patient Instructions (Signed)
 Please quit smoking  Please continue all other medications as before, and refills have been done if requested.  Please have the pharmacy call with any other refills you may need.  Please continue your efforts at being more active, low cholesterol diet, and weight control.  You are otherwise up to date with prevention measures today.  Please keep your appointments with your specialists as you may have planned  Your LAB testing was very good  You will be contacted regarding the referral for: cardiac monitor, and Echocardiogram  Please let us  know if the chest wall nodule is getting larger, as we could refer then to General Surgury  Please make an Appointment to return in 6 months, or sooner if needed

## 2023-12-08 NOTE — Assessment & Plan Note (Signed)
 stable, cont inhaler prn

## 2023-12-08 NOTE — Assessment & Plan Note (Signed)
 BP Readings from Last 3 Encounters:  12/08/23 (!) 158/82  11/03/23 130/80  06/07/23 126/80   Uncontrolled but pt states controlled at home, pt for diet, wt control, low salt

## 2023-12-09 NOTE — Addendum Note (Signed)
 Addended byBETHA LUCETTA CLEATRICE LELON on: 12/09/2023 08:21 AM   Modules accepted: Orders

## 2023-12-15 ENCOUNTER — Ambulatory Visit (HOSPITAL_COMMUNITY)
Admission: RE | Admit: 2023-12-15 | Discharge: 2023-12-15 | Disposition: A | Discharge status: Home / Self Care | Source: Ambulatory Visit | Attending: Internal Medicine | Admitting: Internal Medicine

## 2023-12-15 ENCOUNTER — Ambulatory Visit: Payer: Self-pay | Admitting: Internal Medicine

## 2023-12-15 DIAGNOSIS — R0600 Dyspnea, unspecified: Secondary | ICD-10-CM | POA: Diagnosis not present

## 2023-12-15 LAB — ECHOCARDIOGRAM COMPLETE
AR max vel: 2.57 cm2
AV Area VTI: 2.46 cm2
AV Area mean vel: 2.42 cm2
AV Mean grad: 3 mmHg
AV Peak grad: 6.5 mmHg
Ao pk vel: 1.27 m/s
Area-P 1/2: 5.97 cm2
S' Lateral: 2.6 cm

## 2024-01-19 ENCOUNTER — Other Ambulatory Visit: Payer: Self-pay | Admitting: Internal Medicine

## 2024-01-19 DIAGNOSIS — Z1231 Encounter for screening mammogram for malignant neoplasm of breast: Secondary | ICD-10-CM

## 2024-01-24 ENCOUNTER — Ambulatory Visit
Admission: RE | Admit: 2024-01-24 | Discharge: 2024-01-24 | Disposition: A | Discharge status: Home / Self Care | Source: Ambulatory Visit | Attending: Internal Medicine | Admitting: Internal Medicine

## 2024-01-24 DIAGNOSIS — Z1231 Encounter for screening mammogram for malignant neoplasm of breast: Secondary | ICD-10-CM

## 2024-01-25 DIAGNOSIS — Z5181 Encounter for therapeutic drug level monitoring: Secondary | ICD-10-CM | POA: Diagnosis not present

## 2024-01-25 DIAGNOSIS — R2243 Localized swelling, mass and lump, lower limb, bilateral: Secondary | ICD-10-CM | POA: Diagnosis not present

## 2024-01-25 DIAGNOSIS — M7989 Other specified soft tissue disorders: Secondary | ICD-10-CM | POA: Diagnosis not present

## 2024-01-25 DIAGNOSIS — R600 Localized edema: Secondary | ICD-10-CM | POA: Diagnosis not present

## 2024-01-25 DIAGNOSIS — R230 Cyanosis: Secondary | ICD-10-CM | POA: Diagnosis not present

## 2024-01-25 DIAGNOSIS — R0602 Shortness of breath: Secondary | ICD-10-CM | POA: Diagnosis not present

## 2024-01-25 DIAGNOSIS — Z20822 Contact with and (suspected) exposure to covid-19: Secondary | ICD-10-CM | POA: Diagnosis not present

## 2024-01-25 DIAGNOSIS — R7303 Prediabetes: Secondary | ICD-10-CM | POA: Diagnosis not present

## 2024-01-25 DIAGNOSIS — R059 Cough, unspecified: Secondary | ICD-10-CM | POA: Diagnosis not present

## 2024-01-25 DIAGNOSIS — R058 Other specified cough: Secondary | ICD-10-CM | POA: Diagnosis not present

## 2024-01-25 DIAGNOSIS — E785 Hyperlipidemia, unspecified: Secondary | ICD-10-CM | POA: Diagnosis not present

## 2024-01-25 DIAGNOSIS — R000 Tachycardia, unspecified: Secondary | ICD-10-CM | POA: Diagnosis not present

## 2024-01-25 DIAGNOSIS — Z79899 Other long term (current) drug therapy: Secondary | ICD-10-CM | POA: Diagnosis not present

## 2024-01-25 DIAGNOSIS — F17200 Nicotine dependence, unspecified, uncomplicated: Secondary | ICD-10-CM | POA: Diagnosis not present

## 2024-01-25 DIAGNOSIS — R0981 Nasal congestion: Secondary | ICD-10-CM | POA: Diagnosis not present

## 2024-01-26 ENCOUNTER — Telehealth: Payer: Self-pay

## 2024-01-26 DIAGNOSIS — R000 Tachycardia, unspecified: Secondary | ICD-10-CM

## 2024-01-26 NOTE — Telephone Encounter (Signed)
 Copied from CRM [id number]. Topic: Referral - Question >> Jan 26, 2024  1:56 PM Brittany M wrote: Reason for CRM: Patient calling to check on referral for Heart monitor. Has not heard anything on it.

## 2024-01-27 NOTE — Telephone Encounter (Signed)
 Called and let Pt know

## 2024-01-27 NOTE — Addendum Note (Signed)
 Addended by: NORLEEN LYNWOOD ORN on: 01/27/2024 09:26 AM   Modules accepted: Orders

## 2024-01-27 NOTE — Telephone Encounter (Signed)
 Please to contact pt -   I see where the original order was from Sept 11, but I am not sure why you have not been contacted. I have re-ordered this today so hopefully you will hear soon. Thanks!

## 2024-02-01 ENCOUNTER — Other Ambulatory Visit: Payer: Self-pay | Admitting: Internal Medicine

## 2024-02-01 DIAGNOSIS — N6310 Unspecified lump in the right breast, unspecified quadrant: Secondary | ICD-10-CM

## 2024-02-06 ENCOUNTER — Telehealth: Payer: Self-pay | Admitting: *Deleted

## 2024-02-06 NOTE — Telephone Encounter (Signed)
 Patient called in wanting to schedule lung cancer screening CT. I explained CMS age guidelines for lung cancer screening. Patient has appt with PCP coming up and will discuss getting a Chest CT W/O contrast due to SOB she's been having. Nothing further needed at this time.

## 2024-02-08 ENCOUNTER — Encounter: Payer: Self-pay | Admitting: Internal Medicine

## 2024-02-08 ENCOUNTER — Ambulatory Visit (INDEPENDENT_AMBULATORY_CARE_PROVIDER_SITE_OTHER): Admitting: Internal Medicine

## 2024-02-08 VITALS — BP 144/88 | HR 86 | Temp 97.6°F | Ht 62.5 in | Wt 176.0 lb

## 2024-02-08 DIAGNOSIS — R0601 Orthopnea: Secondary | ICD-10-CM | POA: Insufficient documentation

## 2024-02-08 DIAGNOSIS — G4734 Idiopathic sleep related nonobstructive alveolar hypoventilation: Secondary | ICD-10-CM | POA: Diagnosis not present

## 2024-02-08 DIAGNOSIS — R030 Elevated blood-pressure reading, without diagnosis of hypertension: Secondary | ICD-10-CM | POA: Diagnosis not present

## 2024-02-08 DIAGNOSIS — J440 Chronic obstructive pulmonary disease with acute lower respiratory infection: Secondary | ICD-10-CM | POA: Diagnosis not present

## 2024-02-08 DIAGNOSIS — R911 Solitary pulmonary nodule: Secondary | ICD-10-CM

## 2024-02-08 DIAGNOSIS — J209 Acute bronchitis, unspecified: Secondary | ICD-10-CM

## 2024-02-08 NOTE — Patient Instructions (Signed)
 Please continue all other medications as before, and refills have been done if requested.  Please have the pharmacy call with any other refills you may need.  Please continue your efforts at being more active, low cholesterol diet, and weight control.  You are otherwise up to date with prevention measures today.  Please keep your appointments with your specialists as you may have planned  We can hold on lab testing today, but You will be contacted regarding the referral for: CT chest (no contrast), and Pulmonary for the low oxygen with sleeping lying flat  Please make an Appointment to return in 6 months, or sooner if needed

## 2024-02-08 NOTE — Assessment & Plan Note (Signed)
 Exam benign, for CT chest as requested to also f/u pulm nodule, and refer pulmonary

## 2024-02-08 NOTE — Assessment & Plan Note (Signed)
 Etiology unclear, for CT chest and pulm referral

## 2024-02-08 NOTE — Assessment & Plan Note (Signed)
 Mild uncontrolled, pt declines any change in tx,   to f/u any worsening symptoms or concerns

## 2024-02-08 NOTE — Assessment & Plan Note (Signed)
 Overall stable, continue current med tx

## 2024-02-08 NOTE — Progress Notes (Signed)
 Patient ID: Brittany Oconnor, female   DOB: December 05, 1945, 78 y.o.   MRN: [id number]        Chief Complaint: follow up recent visit oct 29 to [facility] ED with sob cough, orthopnea with noctural desaturation by history, and also with left hip bursitis, elevated BP       HPI:  Brittany Oconnor is a 78 y.o. female here after the recent ED visit with subjective sob and evaluation c/w benign process, except for mention of orthopnea with nocturnal desat 82-88%.  No change in tx, but asked to f/u here.  Pt has aged out of early LDCT chest scans, but has hx of symptoms above and pulmonary nodule.  Pt requests cT chest instead.  Pt denies chest pain, increased sob or doe since oct 29, wheezing, orthopnea, PND, increased LE swelling, palpitations, dizziness or syncope.  Also has mild left hip sore tenderness with lying on the left side at night.   Wt Readings from Last 3 Encounters:  02/08/24 176 lb (79.8 kg)  12/08/23 175 lb (79.4 kg)  11/03/23 175 lb 3.2 oz (79.5 kg)   BP Readings from Last 3 Encounters:  02/08/24 (!) 144/88  12/08/23 (!) 158/82  11/03/23 130/80         Past Medical History:  Diagnosis Date   ALLERGIC RHINITIS 11/24/2006   Allergy    ANXIETY 07/10/2008   DIABETES MELLITUS, TYPE II 11/24/2006   GERD (gastroesophageal reflux disease)    History of shingles    HYPERLIPIDEMIA 11/24/2006   Past Surgical History:  Procedure Laterality Date   BREAST BIOPSY Right 03/11/2015   benign   EYE SURGERY Bilateral 2022   Cataract surgery   MOUTH SURGERY      reports that she has been smoking cigarettes. She has a 23.5 pack-year smoking history. She has never used smokeless tobacco. She reports that she does not drink alcohol and does not use drugs. family history includes Appendicitis in her father; Colon cancer (age of onset: 77) in her sister; Colon cancer (age of onset: 58) in her sister; Heart disease in her sister; Hypertension in her father; Stroke in her mother. Allergies  Allergen Reactions    Shellfish Allergy Hives   Doxycycline    Current Outpatient Medications on File Prior to Visit  Medication Sig Dispense Refill   amitriptyline  (ELAVIL ) 50 MG tablet Take 1 tablet (50 mg total) by mouth at bedtime. 90 tablet 1   aspirin  81 MG tablet Take 81 mg by mouth daily.     cholecalciferol (VITAMIN D3) 25 MCG (1000 UNIT) tablet Take 50 mcg by mouth daily.     cyanocobalamin  (VITAMIN B12) 1000 MCG tablet Take 1,000 mcg by mouth daily.     Lancets MISC Apply 1 Device topically daily. E11.9 100 each 3   metFORMIN  (GLUCOPHAGE -XR) 500 MG 24 hr tablet TAKE 3 TABLETS BY MOUTH EVERY MORNING 270 tablet 3   simvastatin  (ZOCOR ) 20 MG tablet Take 1 tablet (20 mg total) by mouth daily. 90 tablet 3   No current facility-administered medications on file prior to visit.        ROS:  All others reviewed and negative.  Objective        PE:  BP (!) 144/88   Pulse 86   Temp 97.6 F (36.4 C) (Temporal)   Ht 5' 2.5 (1.588 m)   Wt 176 lb (79.8 kg)   SpO2 94%   BMI 31.68 kg/m  Constitutional: Pt appears in NAD               HENT: Head: NCAT.                Right Ear: External ear normal.                 Left Ear: External ear normal.                Eyes: . Pupils are equal, round, and reactive to light. Conjunctivae and EOM are normal               Nose: without d/c or deformity               Neck: Neck supple. Gross normal ROM               Cardiovascular: Normal rate and regular rhythm.                 Pulmonary/Chest: Effort normal and breath sounds without rales or wheezing.                Abd:  Soft, NT, ND, + BS, no organomegaly; mild tender over left greater trochanter area               Neurological: Pt is alert. At baseline orientation, motor grossly intact               Skin: Skin is warm. No rashes, no other new lesions, LE edema - none               Psychiatric: Pt behavior is normal without agitation   Micro: none  Cardiac tracings I have personally interpreted  today:  none  Pertinent Radiological findings (summarize): none   Lab Results  Component Value Date   WBC 6.8 12/06/2023   HGB 14.1 12/06/2023   HCT 43.1 12/06/2023   PLT 240.0 12/06/2023   GLUCOSE 94 12/06/2023   CHOL 143 12/06/2023   TRIG 108.0 12/06/2023   HDL 70.40 12/06/2023   LDLDIRECT 99.9 03/07/2007   LDLCALC 51 12/06/2023   ALT 9 12/06/2023   AST 15 12/06/2023   NA 139 12/06/2023   K 4.0 12/06/2023   CL 99 12/06/2023   CREATININE 0.95 12/06/2023   BUN 15 12/06/2023   CO2 31 12/06/2023   TSH 1.01 12/06/2023   HGBA1C 6.9 (H) 12/06/2023   MICROALBUR 8.3 (H) 12/06/2023   Assessment/Plan:  Brittany Oconnor is a 78 y.o. Black or African American [2] female with  has a past medical history of ALLERGIC RHINITIS (11/24/2006), Allergy, ANXIETY (07/10/2008), DIABETES MELLITUS, TYPE II (11/24/2006), GERD (gastroesophageal reflux disease), History of shingles, and HYPERLIPIDEMIA (11/24/2006).  COPD (chronic obstructive pulmonary disease) with acute bronchitis (HCC) Overall stable, continue current med tx  Orthopnea Exam benign, for CT chest as requested to also f/u pulm nodule, and refer pulmonary  Nocturnal oxygen desaturation Etiology unclear, for CT chest and pulm referral   ELEVATED BLOOD PRESSURE WITHOUT DIAGNOSIS OF HYPERTENSION Mild uncontrolled, pt declines any change in tx,   to f/u any worsening symptoms or concerns  Followup: Return in about 6 months (around 08/07/2024).  Lynwood Rush, MD 02/08/2024 1:01 PM [facility]

## 2024-02-13 ENCOUNTER — Ambulatory Visit
Admission: RE | Admit: 2024-02-13 | Discharge: 2024-02-13 | Disposition: A | Discharge status: Home / Self Care | Source: Ambulatory Visit | Attending: Internal Medicine | Admitting: Internal Medicine

## 2024-02-13 DIAGNOSIS — J432 Centrilobular emphysema: Secondary | ICD-10-CM | POA: Diagnosis not present

## 2024-02-13 DIAGNOSIS — R911 Solitary pulmonary nodule: Secondary | ICD-10-CM

## 2024-02-17 ENCOUNTER — Ambulatory Visit: Payer: Self-pay | Admitting: Internal Medicine

## 2024-02-17 DIAGNOSIS — J432 Centrilobular emphysema: Secondary | ICD-10-CM | POA: Diagnosis not present

## 2024-02-27 ENCOUNTER — Ambulatory Visit
Admission: RE | Admit: 2024-02-27 | Discharge: 2024-02-27 | Disposition: A | Source: Ambulatory Visit | Attending: Internal Medicine | Admitting: Internal Medicine

## 2024-02-27 DIAGNOSIS — R928 Other abnormal and inconclusive findings on diagnostic imaging of breast: Secondary | ICD-10-CM | POA: Diagnosis not present

## 2024-02-27 DIAGNOSIS — N6310 Unspecified lump in the right breast, unspecified quadrant: Secondary | ICD-10-CM

## 2024-02-27 DIAGNOSIS — N6489 Other specified disorders of breast: Secondary | ICD-10-CM | POA: Diagnosis not present

## 2024-03-19 ENCOUNTER — Encounter: Payer: Self-pay | Admitting: Pulmonary Disease

## 2024-03-19 ENCOUNTER — Ambulatory Visit (INDEPENDENT_AMBULATORY_CARE_PROVIDER_SITE_OTHER): Admitting: Pulmonary Disease

## 2024-03-19 VITALS — BP 128/78 | HR 112 | Temp 98.9°F | Ht 62.5 in | Wt 177.4 lb

## 2024-03-19 DIAGNOSIS — G4734 Idiopathic sleep related nonobstructive alveolar hypoventilation: Secondary | ICD-10-CM

## 2024-03-19 DIAGNOSIS — R0600 Dyspnea, unspecified: Secondary | ICD-10-CM | POA: Diagnosis not present

## 2024-03-19 DIAGNOSIS — J438 Other emphysema: Secondary | ICD-10-CM | POA: Diagnosis not present

## 2024-03-19 DIAGNOSIS — F17210 Nicotine dependence, cigarettes, uncomplicated: Secondary | ICD-10-CM

## 2024-03-19 MED ORDER — NICOTINE 21 MG/24HR TD PT24: 21.0000 mg | MEDICATED_PATCH | Freq: Every day | TRANSDERMAL | 5 refills | Status: AC

## 2024-03-19 MED ORDER — NICOTINE POLACRILEX 2 MG MT LOZG: 2.0000 mg | LOZENGE | OROMUCOSAL | 5 refills | Status: AC | PRN

## 2024-03-19 MED ORDER — BUPROPION HCL ER (SR) 150 MG PO TB12: 150.0000 mg | ORAL_TABLET | Freq: Two times a day (BID) | ORAL | 5 refills | Status: DC

## 2024-03-19 MED ORDER — NICOTINE 14 MG/24HR TD PT24: 14.0000 mg | MEDICATED_PATCH | Freq: Every day | TRANSDERMAL | 5 refills | Status: AC

## 2024-03-19 NOTE — Patient Instructions (Addendum)
 Your Quit Date: December 29th, 2025.  I have prescribed Wellbutrin  (Bupropion  SR) 150 mg tablets. The patient is to start these one week before their selected quit date. This can be taken with or without food. To start, will have patient take 1 tablet by mouth daily for 3 days. Then increase to 1 tablet twice daily. We will plan to continue this for a total of 12 weeks after reassessing how the patient is doing at follow-up.   I have prescribed Nicotine  patches.  The patient is to apply the patch to a non-hairy skin site and rotate the site daily. Will begin with starting dose of Because you were smoking > 10 cigarettes per day, I have prescribed a 21 mg patch for you to apply daily for 6 weeks. Then we will plan to decrease to a 14 mg patch daily for 2 weeks, followed by a 7 mg patch daily for 2 more weeks.  We will discuss again in detail at your follow-up visit! and I have prescribed Nicotine  Lozenges with the following instructions: I have sent in a 2 mg lozenge for you to take as follows:  Weeks 1 to 6: 1 lozenge every 1 to 2 hours (maximum: 5 lozenges every 6 hours; 20 lozenges/day); to increase chances of quitting, use at least 9 lozenges/day during the first 6 weeks. Weeks 7 to 9: 1 lozenge every 2 to 4 hours (maximum: 5 lozenges every 6 hours; 20 lozenges/day). Weeks 10 to 12: 1 lozenge every 4 to 8 hours (maximum: 5 lozenges every 6 hours; 20 lozenges/day).   I strongly recommend you also call 1-800-QUIT-NOW (678-705-5660) to register with [facility] [place] -- this service offers free counseling and free samples of nicotine  replacement supplies).  If you prefer, you can also go to www.smokefree.gov to access free phone-, text-, and web-based smoking cessation programs.  ----------    I strongly recommend you also call 1-800-QUIT-NOW (684-345-0483) to register with [facility] [place] -- this service offers free counseling and free samples of nicotine  replacement supplies).  If you prefer, you can  also go to www.smokefree.gov to access free phone-, text-, and web-based smoking cessation programs.  ----------   Please schedule your Pulmonary Function Test (PFTs) when you check out today.  ----------   Please schedule a follow up appointment with me when you check out today.  ----------

## 2024-03-19 NOTE — Progress Notes (Signed)
 "  Synopsis: Referred in 02/08/2024 for oxygen desaturation when sleeping by Norleen Lynwood ORN, MD  Subjective:   PATIENT ID: Brittany Oconnor Grumbles GENDER: female DOB: 01-Jun-1945, MRN: [id number]  Chief Complaint  Patient presents with   Consult    No sleep study.    HPI  Brittany Oconnor is a 78 y.o. woman who is a current smoker and presents today for evaluation. The reason for referral is somewhat unclear. The consultation lists nocturnal oxygen desaturation as the reason for referral, however the patient has never undergone any such testing. They also deny any significant symptoms of OSA. They do have a significant smoking history and emphysema noted on Chest CT. They have never been diagnosed with COPD or OSA.  Recent visit to the ED 01/25/2024 for dyspnea and cough, + orthopnea, + LE edema (L > R). Note made during that encounter by the ED MD of a low oxygen saturation at home while sleeping in a recliner (SpO2 82-88%) that improved with supplemental oxygen, however the patient denies having either an oximetry device at home or any home oxygen. Possible this occurred in the ED rather than at home as charted.  Smoker: 0.5 ppd x 50 years => 25 pack years Longest quit period: 2 years. Has used nicotine  gum but not in combination with patches in the past, and now has dentures not suitable for using nicotine  gum. She wants to quit and would like to set a quit date. Motivating factor is her granddaughter's upcoming graduation in May, as well as her health and cost of smoking. She does get short of breath when climbing stairs.  She endorses frequent coughing, non-productive / thin white phlegm. She endorses some snoring, though only when sleeping on her back. She denies EDS symptoms, or frequent waking during the night. Nocturia: 0-1x/night (within normal range). No waking headaches.  Pulm Questionnaires:     03/19/2024   11:00 AM  Results of the Epworth flowsheet  Sitting and reading 0   Watching TV 0  Sitting, inactive in a public place (e.g. a theatre or a meeting) 0  As a passenger in a car for an hour without a break 2  Lying down to rest in the afternoon when circumstances permit 2  Sitting and talking to someone 0  Sitting quietly after a lunch without alcohol 0  In a car, while stopped for a few minutes in traffic 0  Total score 4        No data to display            Past Medical History:  Diagnosis Date   ALLERGIC RHINITIS 11/24/2006   Allergy    ANXIETY 07/10/2008   DIABETES MELLITUS, TYPE II 11/24/2006   GERD (gastroesophageal reflux disease)    History of shingles    HYPERLIPIDEMIA 11/24/2006     Family History  Problem Relation Age of Onset   Colon cancer Sister 89       deceased    Heart disease Sister        died of heart attack   Colon cancer Sister 75       alive   Hypertension Father    Appendicitis Father        rupture   Stroke Mother        stroke 59;died age 79     Past Surgical History:  Procedure Laterality Date   BREAST BIOPSY Right 03/11/2015   benign   EYE SURGERY Bilateral 2022   Cataract  surgery   MOUTH SURGERY      Social History   Socioeconomic History   Marital status: Married    Spouse name: Bruce   Number of children: 1   Years of education: Not on file   Highest education level: Not on file  Occupational History   Not on file  Tobacco Use   Smoking status: Every Day    Current packs/day: 0.50    Average packs/day: 0.5 packs/day for 47.0 years (23.5 ttl pk-yrs)    Types: Cigarettes   Smokeless tobacco: Never   Tobacco comments:    0.5 ppd  Vaping Use   Vaping status: Never Used  Substance and Sexual Activity   Alcohol use: No    Alcohol/week: 0.0 standard drinks of alcohol   Drug use: No   Sexual activity: Not on file  Other Topics Concern   Not on file  Social History Narrative   Lives with husband/2025   Social Drivers of Health   Tobacco Use: High Risk (03/19/2024)   Patient History     Smoking Tobacco Use: Every Day    Smokeless Tobacco Use: Never    Passive Exposure: Not on file  Financial Resource Strain: Low Risk (11/03/2023)   Overall Financial Resource Strain (CARDIA)    Difficulty of Paying Living Expenses: Not hard at all  Food Insecurity: No Food Insecurity (11/03/2023)   Epic    Worried About Radiation Protection Practitioner of Food in the Last Year: Never true    Ran Out of Food in the Last Year: Never true  Transportation Needs: No Transportation Needs (11/03/2023)   Epic    Lack of Transportation (Medical): No    Lack of Transportation (Non-Medical): No  Physical Activity: Patient Declined (11/03/2023)   Exercise Vital Sign    Days of Exercise per Week: Patient declined    Minutes of Exercise per Session: Patient declined  Stress: No Stress Concern Present (11/03/2023)   [employer] of Occupational Health - Occupational Stress Questionnaire    Feeling of Stress: Not at all  Social Connections: Moderately Isolated (11/03/2023)   Social Connection and Isolation Panel    Frequency of Communication with Friends and Family: More than three times a week    Frequency of Social Gatherings with Friends and Family: Once a week    Attends Religious Services: Never    Database Administrator or Organizations: No    Attends Banker Meetings: Never    Marital Status: Married  Catering Manager Violence: Not At Risk (11/03/2023)   Epic    Fear of Current or Ex-Partner: No    Emotionally Abused: No    Physically Abused: No    Sexually Abused: No  Depression (PHQ2-9): Low Risk (11/03/2023)   Depression (PHQ2-9)    PHQ-2 Score: 2  Alcohol Screen: Low Risk (11/03/2023)   Alcohol Screen    Last Alcohol Screening Score (AUDIT): 0  Housing: Unknown (01/25/2024)   Received from [facility]   Epic    Unable to Pay for Housing in the Last Year: Not on file    Number of Times Moved in the Last Year: Not on file    At any time in the past 12 months, were you homeless  or living in a shelter (including now)?: No  Utilities: Not At Risk (11/03/2023)   Epic    Threatened with loss of utilities: No  Health Literacy: Adequate Health Literacy (11/03/2023)   B1300 Health Literacy    Frequency  of need for help with medical instructions: Never     Allergies[1]   Outpatient Medications Prior to Visit  Medication Sig Dispense Refill   aspirin  81 MG tablet Take 81 mg by mouth daily.     cholecalciferol (VITAMIN D3) 25 MCG (1000 UNIT) tablet Take 50 mcg by mouth daily.     cyanocobalamin  (VITAMIN B12) 1000 MCG tablet Take 1,000 mcg by mouth daily.     Lancets MISC Apply 1 Device topically daily. E11.9 100 each 3   metFORMIN  (GLUCOPHAGE -XR) 500 MG 24 hr tablet TAKE 3 TABLETS BY MOUTH EVERY MORNING 270 tablet 3   simvastatin  (ZOCOR ) 20 MG tablet Take 1 tablet (20 mg total) by mouth daily. 90 tablet 3   amitriptyline  (ELAVIL ) 50 MG tablet Take 1 tablet (50 mg total) by mouth at bedtime. 90 tablet 1   No facility-administered medications prior to visit.    ROS   Objective:  Physical Exam Constitutional:      Appearance: Normal appearance.  HENT:     Head: Normocephalic and atraumatic.     Mouth/Throat:     Mouth: Mucous membranes are moist.     Pharynx: Oropharynx is clear. No oropharyngeal exudate or posterior oropharyngeal erythema.  Eyes:     General: No scleral icterus.    Conjunctiva/sclera: Conjunctivae normal.  Pulmonary:     Effort: Pulmonary effort is normal. No respiratory distress.     Breath sounds: No stridor. Wheezing and rhonchi present. No rales.  Musculoskeletal:     Right lower leg: No edema.     Left lower leg: No edema.  Neurological:     Mental Status: She is alert and oriented to person, place, and time.  Psychiatric:        Thought Content: Thought content normal.      Vitals:   03/19/24 1116  BP: 128/78  Pulse: (!) 112  Temp: 98.9 F (37.2 C)  TempSrc: Oral  SpO2: 96%  Weight: 177 lb 6.4 oz (80.5 kg)  Height: 5'  2.5 (1.588 m)   96% on RA BMI Readings from Last 3 Encounters:  03/19/24 31.93 kg/m  02/08/24 31.68 kg/m  12/08/23 31.50 kg/m   Wt Readings from Last 3 Encounters:  03/19/24 177 lb 6.4 oz (80.5 kg)  02/08/24 176 lb (79.8 kg)  12/08/23 175 lb (79.4 kg)     CBC    Component Value Date/Time   WBC 6.8 12/06/2023 1359   RBC 5.19 (H) 12/06/2023 1359   HGB 14.1 12/06/2023 1359   HCT 43.1 12/06/2023 1359   PLT 240.0 12/06/2023 1359   MCV 83.1 12/06/2023 1359   MCHC 32.6 12/06/2023 1359   RDW 14.1 12/06/2023 1359   LYMPHSABS 3.1 12/06/2023 1359   MONOABS 0.6 12/06/2023 1359   EOSABS 0.1 12/06/2023 1359   BASOSABS 0.0 12/06/2023 1359    Chest Imaging:  CT Chest (02/13/2024): 1. No acute intrathoracic process. 2. No new pulmonary nodules or masses. 3. Pulmonary emphysema is present. Pulmonary emphysema is an independent risk factor for lung cancer. Recommend patient be considered for lung cancer screening. [place]  Chief Financial Officer. Screening for Lung Cancer: [place]  Licensed Conveyancer. JAMA. 2021;325(10):962-970. 4. Aortic Atherosclerosis (ICD10-I70.0). Coronary artery atherosclerosis.  Pulmonary Functions Testing Results:     No data to display          FeNO: N/A  Pathology: N/A  Echocardiogram:  TTE (12/15/2023):  1. Left ventricular ejection fraction, by estimation, is 60 to 65%. The  left ventricle has normal function. The left ventricle has no regional  wall motion abnormalities. There is mild concentric left ventricular  hypertrophy. Left ventricular diastolic parameters were normal.   2. Right ventricular systolic function is normal. The right ventricular  size is normal. There is normal pulmonary artery systolic pressure. The  estimated right ventricular systolic pressure is 22.5 mmHg.   3. The mitral valve is degenerative. Mild mitral valve regurgitation. No  evidence of mitral stenosis. Moderate mitral  annular calcification.   4. The aortic valve is tricuspid. Aortic valve regurgitation is not  visualized. Aortic valve sclerosis/calcification is present, without any  evidence of aortic stenosis. Aortic valve area, by VTI measures 2.46 cm.  Aortic valve mean gradient measures  3.0 mmHg. Aortic valve Vmax measures 1.27 m/s.   5. The inferior vena cava is normal in size with greater than 50%  respiratory variability, suggesting right atrial pressure of 3 mmHg.   Heart Catheterization: N/A  Sleep Studies: N/A    Assessment & Plan:     ICD-10-CM   1. Other emphysema (HCC)  J43.8 Pulmonary Function Test    2. Dyspnea, unspecified type  R06.00     3. Nocturnal oxygen desaturation  G47.34     4. Cigarette nicotine  dependence without complication  F17.210 nicotine  (NICODERM CQ  - DOSED IN MG/24 HOURS) 14 mg/24hr patch    nicotine  polacrilex (COMMIT) 2 MG lozenge    buPROPion  (WELLBUTRIN  SR) 150 MG 12 hr tablet    nicotine  (NICODERM CQ  - DOSED IN MG/24 HOURS) 21 mg/24hr patch      Discussion:  This patient has radiographic evidence of emphysema and a clinical exam that suggests COPD.  PFTs ordered. Smoking cessation advised.  We discussed the utility of ordering a sleep study at this time to evaluate for OSA. We elected to defer for today but can reconsider at follow up. For now, we will focus on PFTs and smoking cessation.  She is willing to quit smoking and received smoking cessation counseling and prescriptions for NRT (21 mg patch + nicotine  lozenge 2mg  + Wellbutrin  SR 150 mg BID today).  Smoking/Tobacco Cessation Counseling Vinette E Everding is a current user of tobacco or nicotine  products.  # Cigarettes / day: > 10 per day. First cigarette within 30 mins of waking: No.  She is ready to quit at this time. Counseling provided today addressed the risks of continued use and the benefits of cessation. Discussed tobacco/nicotine  use history, readiness to quit, and evidence-based  treatment options including behavioral strategies, support resources, and pharmacologic therapies. Provided encouragement and educational materials on steps and resources to quit smoking. Patient questions were addressed, and follow-up recommended for continued support. Total time spent on counseling: 12 minutes.   Follow up: 4 months   Current Medications[2]   Lamar Dales, MD Pulmonary, Critical Care & Sleep Medicine [facility] 03/19/2024 12:07 PM     [1]  Allergies Allergen Reactions   Shellfish Allergy Hives   Doxycycline   [2]  Current Outpatient Medications:    aspirin  81 MG tablet, Take 81 mg by mouth daily., Disp: , Rfl:    buPROPion  (WELLBUTRIN  SR) 150 MG 12 hr tablet, Take 1 tablet (150 mg total) by mouth 2 (two) times daily. Start taking 1-2 weeks prior to smoking quit date. Take 1 tablet daily for the first 3 days, then increase to 1 tablet twice a day., Disp: 60 tablet, Rfl: 5   cholecalciferol (VITAMIN D3) 25 MCG (1000 UNIT) tablet, Take  50 mcg by mouth daily., Disp: , Rfl:    cyanocobalamin  (VITAMIN B12) 1000 MCG tablet, Take 1,000 mcg by mouth daily., Disp: , Rfl:    Lancets MISC, Apply 1 Device topically daily. E11.9, Disp: 100 each, Rfl: 3   metFORMIN  (GLUCOPHAGE -XR) 500 MG 24 hr tablet, TAKE 3 TABLETS BY MOUTH EVERY MORNING, Disp: 270 tablet, Rfl: 3   nicotine  (NICODERM CQ  - DOSED IN MG/24 HOURS) 14 mg/24hr patch, Place 1 patch (14 mg total) onto the skin daily., Disp: 30 patch, Rfl: 5   nicotine  (NICODERM CQ  - DOSED IN MG/24 HOURS) 21 mg/24hr patch, Place 1 patch (21 mg total) onto the skin daily., Disp: 30 patch, Rfl: 5   nicotine  polacrilex (COMMIT) 2 MG lozenge, Take 1 lozenge (2 mg total) by mouth every hour as needed for smoking cessation., Disp: 48 lozenge, Rfl: 5   simvastatin  (ZOCOR ) 20 MG tablet, Take 1 tablet (20 mg total) by mouth daily., Disp: 90 tablet, Rfl: 3  "

## 2024-04-05 ENCOUNTER — Telehealth: Payer: Self-pay

## 2024-04-05 NOTE — Telephone Encounter (Signed)
 Copied from CRM #[id number]. Topic: Appointments - Transfer of Care >> Apr 05, 2024 11:21 AM Drema MATSU wrote: Pt is requesting to transfer FROM: Dr. Norleen Pt is requesting to transfer TO: Dr, Thedora Reason for requested transfer: Dr. Norleen is leaving the practice It is the responsibility of the team the patient would like to transfer to (Dr. Thedora) to reach out to the patient if for any reason this transfer is not acceptable.   Please review and advise.  Thanks. Dm/cma

## 2024-04-15 ENCOUNTER — Other Ambulatory Visit: Payer: Self-pay | Admitting: Pulmonary Disease

## 2024-04-15 DIAGNOSIS — F17210 Nicotine dependence, cigarettes, uncomplicated: Secondary | ICD-10-CM

## 2024-06-05 ENCOUNTER — Encounter: Admitting: Family Medicine

## 2024-06-06 ENCOUNTER — Ambulatory Visit: Admitting: Internal Medicine

## 2024-07-19 ENCOUNTER — Encounter

## 2024-07-19 ENCOUNTER — Ambulatory Visit: Admitting: Pulmonary Disease

## 2024-08-09 ENCOUNTER — Ambulatory Visit: Admitting: Family Medicine
